# Patient Record
Sex: Female | Born: 1935 | Race: White | Hispanic: No | Smoking: Former smoker
Health system: Southern US, Community
[De-identification: ages and names within clinical notes are randomized; demographics above are authoritative.]

## PROBLEM LIST (undated history)

## (undated) DIAGNOSIS — D126 Benign neoplasm of colon, unspecified: Secondary | ICD-10-CM

## (undated) DIAGNOSIS — C679 Malignant neoplasm of bladder, unspecified: Secondary | ICD-10-CM

## (undated) DIAGNOSIS — I639 Cerebral infarction, unspecified: Secondary | ICD-10-CM

## (undated) DIAGNOSIS — C9000 Multiple myeloma not having achieved remission: Secondary | ICD-10-CM

## (undated) DIAGNOSIS — C801 Malignant (primary) neoplasm, unspecified: Secondary | ICD-10-CM

## (undated) DIAGNOSIS — K219 Gastro-esophageal reflux disease without esophagitis: Secondary | ICD-10-CM

## (undated) DIAGNOSIS — N189 Chronic kidney disease, unspecified: Secondary | ICD-10-CM

## (undated) DIAGNOSIS — R42 Dizziness and giddiness: Secondary | ICD-10-CM

## (undated) DIAGNOSIS — M1990 Unspecified osteoarthritis, unspecified site: Secondary | ICD-10-CM

## (undated) DIAGNOSIS — I10 Essential (primary) hypertension: Secondary | ICD-10-CM

## (undated) HISTORY — PX: OTHER SURGICAL HISTORY: SHX169

## (undated) HISTORY — PX: BREAST SURGERY: SHX581

## (undated) HISTORY — PX: HERNIA REPAIR: SHX51

---

## 1968-04-05 HISTORY — PX: BREAST EXCISIONAL BIOPSY: SUR124

## 2014-11-12 ENCOUNTER — Ambulatory Visit: Payer: Self-pay

## 2014-11-29 ENCOUNTER — Other Ambulatory Visit: Payer: Self-pay | Admitting: Internal Medicine

## 2014-11-29 ENCOUNTER — Ambulatory Visit
Admission: RE | Admit: 2014-11-29 | Discharge: 2014-11-29 | Disposition: A | Discharge status: Home / Self Care | Payer: Medicare Other | Source: Ambulatory Visit | Attending: Internal Medicine | Admitting: Internal Medicine

## 2014-11-29 DIAGNOSIS — M170 Bilateral primary osteoarthritis of knee: Secondary | ICD-10-CM | POA: Diagnosis not present

## 2014-11-29 DIAGNOSIS — C9000 Multiple myeloma not having achieved remission: Secondary | ICD-10-CM

## 2014-12-19 DIAGNOSIS — M11269 Other chondrocalcinosis, unspecified knee: Secondary | ICD-10-CM | POA: Insufficient documentation

## 2014-12-19 DIAGNOSIS — M1710 Unilateral primary osteoarthritis, unspecified knee: Secondary | ICD-10-CM | POA: Insufficient documentation

## 2014-12-19 DIAGNOSIS — M179 Osteoarthritis of knee, unspecified: Secondary | ICD-10-CM | POA: Insufficient documentation

## 2015-06-02 ENCOUNTER — Emergency Department: Payer: Medicare Other

## 2015-06-02 ENCOUNTER — Emergency Department
Admission: EM | Admit: 2015-06-02 | Discharge: 2015-06-02 | Disposition: A | Discharge status: Home / Self Care | Payer: Medicare Other | Attending: Emergency Medicine | Admitting: Emergency Medicine

## 2015-06-02 DIAGNOSIS — F419 Anxiety disorder, unspecified: Secondary | ICD-10-CM | POA: Insufficient documentation

## 2015-06-02 DIAGNOSIS — R110 Nausea: Secondary | ICD-10-CM | POA: Diagnosis not present

## 2015-06-02 DIAGNOSIS — I10 Essential (primary) hypertension: Secondary | ICD-10-CM | POA: Insufficient documentation

## 2015-06-02 DIAGNOSIS — R42 Dizziness and giddiness: Secondary | ICD-10-CM | POA: Diagnosis not present

## 2015-06-02 HISTORY — DX: Essential (primary) hypertension: I10

## 2015-06-02 HISTORY — DX: Malignant (primary) neoplasm, unspecified: C80.1

## 2015-06-02 LAB — CBC
HCT: 34.5 % — ABNORMAL LOW (ref 35.0–47.0)
Hemoglobin: 11.7 g/dL — ABNORMAL LOW (ref 12.0–16.0)
MCH: 31.5 pg (ref 26.0–34.0)
MCHC: 34 g/dL (ref 32.0–36.0)
MCV: 92.7 fL (ref 80.0–100.0)
PLATELETS: 250 10*3/uL (ref 150–440)
RBC: 3.72 MIL/uL — ABNORMAL LOW (ref 3.80–5.20)
RDW: 14 % (ref 11.5–14.5)
WBC: 7 10*3/uL (ref 3.6–11.0)

## 2015-06-02 LAB — BASIC METABOLIC PANEL
Anion gap: 9 (ref 5–15)
BUN: 28 mg/dL — AB (ref 6–20)
CALCIUM: 9 mg/dL (ref 8.9–10.3)
CO2: 23 mmol/L (ref 22–32)
CREATININE: 1.22 mg/dL — AB (ref 0.44–1.00)
Chloride: 101 mmol/L (ref 101–111)
GFR calc Af Amer: 48 mL/min — ABNORMAL LOW (ref >60)
GFR, EST NON AFRICAN AMERICAN: 41 mL/min — AB (ref >60)
Glucose, Bld: 121 mg/dL — ABNORMAL HIGH (ref 65–99)
Potassium: 4.2 mmol/L (ref 3.5–5.1)
SODIUM: 133 mmol/L — AB (ref 135–145)

## 2015-06-02 MED ORDER — ONDANSETRON 4 MG PO TBDP: 4.0000 mg | ORAL_TABLET | Freq: Three times a day (TID) | ORAL | Status: DC | PRN

## 2015-06-02 MED ORDER — ONDANSETRON HCL 4 MG/2ML IJ SOLN
4.0000 mg | Freq: Once | INTRAMUSCULAR | Status: DC
  Filled 2015-06-02: qty 2

## 2015-06-02 MED ORDER — MECLIZINE HCL 25 MG PO TABS: 25.0000 mg | ORAL_TABLET | Freq: Three times a day (TID) | ORAL | Status: DC | PRN

## 2015-06-02 MED ORDER — MECLIZINE HCL 25 MG PO TABS
25.0000 mg | ORAL_TABLET | Freq: Once | ORAL | Status: AC
  Administered 2015-06-02: 25 mg via ORAL
  Filled 2015-06-02: qty 1

## 2015-06-02 MED ORDER — LORAZEPAM 2 MG/ML IJ SOLN
0.5000 mg | Freq: Once | INTRAMUSCULAR | Status: AC
  Administered 2015-06-02: 0.5 mg via INTRAVENOUS
  Filled 2015-06-02: qty 1

## 2015-06-02 MED ORDER — SODIUM CHLORIDE 0.9 % IV BOLUS (SEPSIS)
1000.0000 mL | Freq: Once | INTRAVENOUS | Status: AC
  Administered 2015-06-02: 1000 mL via INTRAVENOUS

## 2015-06-02 NOTE — ED Notes (Signed)
Pt to CT

## 2015-06-02 NOTE — ED Notes (Signed)
Pt presents via EMS c/o nausea and dizziness. Per EMS pt became very dizzy and nauseated all of a sudden. EMS gave 4mg  of zofran without relief.

## 2015-06-02 NOTE — ED Provider Notes (Signed)
[facility] Emergency Department Provider Note  ____________________________________________  Time seen: Approximately 6:03 PM  I have reviewed the triage vital signs and the nursing notes.   HISTORY  Chief Complaint Dizziness    HPI Julie Khan is a 81 y.o. female with a history of HTN presenting with acute onset of dizziness and nausea. Patient reports that she was in her usual state of health, until she got on the bus [place] from the mall and with the movement of the bus she developed acute room spinning sensationand nausea without vomiting. Any movement of her head makes it worse. She does not have any visual changes, speech changes, mental status changes, numbness tickling or weakness. She is able to walk without difficulty. She was treated for bronchitis last month but otherwise has not had any recent cough or cold symptoms. No fever or chills, headache, or trauma.   Past Medical History  Diagnosis Date  . Hypertension   . Cancer ([place])     There are no active problems to display for this patient.   Past Surgical History  Procedure Laterality Date  . Bladder tumor removed      Current Outpatient Rx  Name  Route  Sig  Dispense  Refill  . meclizine (ANTIVERT) 25 MG tablet   Oral   Take 1 tablet (25 mg total) by mouth 3 (three) times daily as needed for dizziness.   15 tablet   0   . ondansetron (ZOFRAN ODT) 4 MG disintegrating tablet   Oral   Take 1 tablet (4 mg total) by mouth every 8 (eight) hours as needed for nausea or vomiting.   20 tablet   0     Allergies Iodine  Family History  Problem Relation Age of Onset  . Cancer Mother   . Cancer Sister   . Cancer Brother     Social History Social History  Substance Use Topics  . Smoking status: Never Smoker   . Smokeless tobacco: None  . Alcohol Use: No    Review of Systems Constitutional: No fever/chills. Positive dizziness. Negative syncope. Eyes: No visual  changes. No blurred or double vision. ENT: No sore throat. No congestion or rhinorrhea. No ear pain. Cardiovascular: Denies chest pain, palpitations. Respiratory: Denies shortness of breath.  No cough. Gastrointestinal: No abdominal pain.  No nausea, no vomiting.  No diarrhea.  No constipation. Genitourinary: Negative for dysuria. Musculoskeletal: Negative for back pain. Skin: Negative for rash. Neurological: Negative for headache. Positive for room spinning sensation. Negative for numbness tickling or weakness. Negative for visual changes. Negative for speech changes. Negative for difficulty walking.  10-point ROS otherwise negative.  ____________________________________________   PHYSICAL EXAM:  VITAL SIGNS: ED Triage Vitals  Enc Vitals Group     BP 06/02/15 1733 165/92 mmHg     Pulse Rate 06/02/15 1733 88     Resp 06/02/15 1733 14     Temp 06/02/15 1733 97.8 F (36.6 C)     Temp Source 06/02/15 1733 Oral     SpO2 06/02/15 1729 97 %     Weight 06/02/15 1733 155 lb (70.308 kg)     Height 06/02/15 1733 5' (1.524 m)     Head Cir --      Peak Flow --      Pain Score --      Pain Loc --      Pain Edu? --      Excl. in [place]? --  Constitutional: Asian is alert and oriented and able to answer questions appropriately. She is uncomfortable appearing and somewhat diffusely tremulous, but in no acute distress.  Eyes: Conjunctivae are normal.  EOMI. no discharge. No scleral icterus. EARS: TMs are clear without bulge, erythema or fluid bilaterally. Canals are clear as well. Head: Atraumatic. Nose: No congestion/rhinnorhea. Mouth/Throat: Mucous membranes are moist.  Neck: No stridor.  Supple.  JVD. No meningismus. Cardiovascular: Normal rate, regular rhythm. No murmurs, rubs or gallops.  Respiratory: Normal respiratory effort.  No retractions. Lungs CTAB.  No wheezes, rales or ronchi. Gastrointestinal: Soft and nontender. No distention. No peritoneal signs. Musculoskeletal: No LE  edema.  Neurologic: Alert and oriented 3. Speech is clear. Naming and repetition are intact. Face and smile symmetric. EOMI and PERRLA. No nystagmus horizontally or vertically, but lateral gaze reproduces her symptoms bilaterally. Tongue is midline.  No pronator drift. 5 out of 5 grip, biceps, triceps, hip flexors, plantar flexion and dorsiflexion. Normal sensation to light touch in the bilateral upper and lower extremities, and face.  Skin:  Skin is warm, dry and intact. No rash noted. Psychiatric: Mood is normal and affect is anxious.Marland Kitchen Speech and behavior are normal.  Normal judgement.  ____________________________________________   LABS (all labs ordered are listed, but only abnormal results are displayed)  Labs Reviewed  CBC - Abnormal; Notable for the following:    RBC 3.72 (*)    Hemoglobin 11.7 (*)    HCT 34.5 (*)    All other components within normal limits  BASIC METABOLIC PANEL - Abnormal; Notable for the following:    Sodium 133 (*)    Glucose, Bld 121 (*)    BUN 28 (*)    Creatinine, Ser 1.22 (*)    GFR calc non Af Amer 41 (*)    GFR calc Af Amer 48 (*)    All other components within normal limits   ____________________________________________  EKG  ED ECG REPORT I, Eula Listen, the attending physician, personally viewed and interpreted this ECG.   Date: 06/02/2015  EKG Time: 1729  Rate: 90  Rhythm: normal sinus rhythm  Axis: Normal  Intervals:none  ST&T Change: No ST changes. Positive  incomplete right bundle-branch block.  ____________________________________________  RADIOLOGY  Ct Head Wo Contrast  06/02/2015  CLINICAL DATA:  Involuntary tremors, dizziness, nausea EXAM: CT HEAD WITHOUT CONTRAST TECHNIQUE: Contiguous axial images were obtained from the base of the skull through the vertex without intravenous contrast. COMPARISON:  None. FINDINGS: No evidence of parenchymal hemorrhage or extra-axial fluid collection. No mass lesion, mass effect, or  midline shift. No CT evidence of acute infarction. Mild subcortical white matter and periventricular small vessel ischemic changes. Global cortical atrophy.  No ventriculomegaly. The visualized paranasal sinuses are essentially clear. The mastoid air cells are unopacified. No evidence of calvarial fracture. IMPRESSION: No evidence of acute intracranial abnormality. Atrophy with mild small vessel ischemic changes. Electronically Signed   By: Julian Hy M.D.   On: 06/02/2015 18:18    ____________________________________________   PROCEDURES  Procedure(s) performed: None  Critical Care performed: No ____________________________________________   INITIAL IMPRESSION / ASSESSMENT AND PLAN / ED COURSE  Pertinent labs & imaging results that were available during my care of the patient were reviewed by me and considered in my medical decision making (see chart for details).  81 y.o. female with acute onset of dizziness and nausea without vomiting with the movement of a bus. It is possible that the patient has a benign vertigo. However, given  her age and symptoms, I'll get a CT scan to evaluate for CVA. If in the meantime her symptoms improve with treatment for peripheral vertigo, no further imaging is indicated. If not, I'll send her for CT angiogram.  ----------------------------------------- 8:14 PM on 06/02/2015 -----------------------------------------  The patient's symptoms have completely resolved after meclizine and Ativan. She has some mild anemia with a hematocrit of 34.5, and some mild renal insufficiency which is likely due to dehydration. Her CT scan does not show any signs of stroke. I ambulated the patient and she has no gait ataxia, nor does she have any symptoms of dizziness or lightheadedness with walking.   The patient denied any similar symptoms in the past until I reevaluated her, when she reported to me that she had a severe bout last month where she was evaluated with  what is likely an Epley maneuver.  At this time, a peripheral vertigo is much more likely than a central stroke. However, I have given the patient and her family return precautions. They understand follow-up instructions. Plan discharge. ____________________________________________  FINAL CLINICAL IMPRESSION(S) / ED DIAGNOSES  Final diagnoses:  Vertigo  Nausea without vomiting      NEW MEDICATIONS STARTED DURING THIS VISIT:  New Prescriptions   MECLIZINE (ANTIVERT) 25 MG TABLET    Take 1 tablet (25 mg total) by mouth 3 (three) times daily as needed for dizziness.   ONDANSETRON (ZOFRAN ODT) 4 MG DISINTEGRATING TABLET    Take 1 tablet (4 mg total) by mouth every 8 (eight) hours as needed for nausea or vomiting.     Eula Listen, MD 06/02/15 2017

## 2015-06-02 NOTE — Discharge Instructions (Signed)
Please return to the emergency department if you develop dizziness or lightheadedness, fainting, numbness tingling or weakness, headache, visual or speech changes, difficulty walking, or any other symptoms concerning to you.

## 2015-07-24 ENCOUNTER — Encounter: Payer: Self-pay | Admitting: *Deleted

## 2015-07-25 ENCOUNTER — Ambulatory Visit: Payer: Medicare Other | Admitting: Anesthesiology

## 2015-07-25 ENCOUNTER — Ambulatory Visit
Admission: RE | Admit: 2015-07-25 | Discharge: 2015-07-25 | Disposition: A | Discharge status: Home / Self Care | Payer: Medicare Other | Source: Ambulatory Visit | Attending: Gastroenterology | Admitting: Gastroenterology

## 2015-07-25 ENCOUNTER — Encounter
Admission: RE | Disposition: A | Discharge status: Home / Self Care | Payer: Self-pay | Source: Ambulatory Visit | Attending: Gastroenterology

## 2015-07-25 DIAGNOSIS — M1990 Unspecified osteoarthritis, unspecified site: Secondary | ICD-10-CM | POA: Diagnosis not present

## 2015-07-25 DIAGNOSIS — K640 First degree hemorrhoids: Secondary | ICD-10-CM | POA: Insufficient documentation

## 2015-07-25 DIAGNOSIS — N189 Chronic kidney disease, unspecified: Secondary | ICD-10-CM | POA: Diagnosis not present

## 2015-07-25 DIAGNOSIS — Z91041 Radiographic dye allergy status: Secondary | ICD-10-CM | POA: Insufficient documentation

## 2015-07-25 DIAGNOSIS — D123 Benign neoplasm of transverse colon: Secondary | ICD-10-CM | POA: Diagnosis not present

## 2015-07-25 DIAGNOSIS — K5730 Diverticulosis of large intestine without perforation or abscess without bleeding: Secondary | ICD-10-CM | POA: Diagnosis not present

## 2015-07-25 DIAGNOSIS — Z8551 Personal history of malignant neoplasm of bladder: Secondary | ICD-10-CM | POA: Diagnosis not present

## 2015-07-25 DIAGNOSIS — C9000 Multiple myeloma not having achieved remission: Secondary | ICD-10-CM | POA: Insufficient documentation

## 2015-07-25 DIAGNOSIS — Z1211 Encounter for screening for malignant neoplasm of colon: Secondary | ICD-10-CM | POA: Diagnosis present

## 2015-07-25 DIAGNOSIS — K219 Gastro-esophageal reflux disease without esophagitis: Secondary | ICD-10-CM | POA: Insufficient documentation

## 2015-07-25 DIAGNOSIS — Z87891 Personal history of nicotine dependence: Secondary | ICD-10-CM | POA: Diagnosis not present

## 2015-07-25 DIAGNOSIS — E039 Hypothyroidism, unspecified: Secondary | ICD-10-CM | POA: Diagnosis not present

## 2015-07-25 DIAGNOSIS — I129 Hypertensive chronic kidney disease with stage 1 through stage 4 chronic kidney disease, or unspecified chronic kidney disease: Secondary | ICD-10-CM | POA: Diagnosis not present

## 2015-07-25 DIAGNOSIS — R1013 Epigastric pain: Secondary | ICD-10-CM | POA: Diagnosis not present

## 2015-07-25 DIAGNOSIS — Z809 Family history of malignant neoplasm, unspecified: Secondary | ICD-10-CM | POA: Diagnosis not present

## 2015-07-25 DIAGNOSIS — K449 Diaphragmatic hernia without obstruction or gangrene: Secondary | ICD-10-CM | POA: Insufficient documentation

## 2015-07-25 DIAGNOSIS — Z79899 Other long term (current) drug therapy: Secondary | ICD-10-CM | POA: Insufficient documentation

## 2015-07-25 DIAGNOSIS — Z906 Acquired absence of other parts of urinary tract: Secondary | ICD-10-CM | POA: Diagnosis not present

## 2015-07-25 DIAGNOSIS — Z800 Family history of malignant neoplasm of digestive organs: Secondary | ICD-10-CM | POA: Insufficient documentation

## 2015-07-25 HISTORY — DX: Unspecified osteoarthritis, unspecified site: M19.90

## 2015-07-25 HISTORY — PX: COLONOSCOPY WITH PROPOFOL: SHX5780

## 2015-07-25 HISTORY — PX: ESOPHAGOGASTRODUODENOSCOPY (EGD) WITH PROPOFOL: SHX5813

## 2015-07-25 HISTORY — DX: Gastro-esophageal reflux disease without esophagitis: K21.9

## 2015-07-25 HISTORY — DX: Malignant neoplasm of bladder, unspecified: C67.9

## 2015-07-25 HISTORY — DX: Chronic kidney disease, unspecified: N18.9

## 2015-07-25 SURGERY — COLONOSCOPY WITH PROPOFOL
Anesthesia: General

## 2015-07-25 MED ORDER — FENTANYL CITRATE (PF) 100 MCG/2ML IJ SOLN
INTRAMUSCULAR | Status: DC | PRN
  Administered 2015-07-25: 50 ug via INTRAVENOUS

## 2015-07-25 MED ORDER — PROPOFOL 10 MG/ML IV BOLUS
INTRAVENOUS | Status: DC | PRN
  Administered 2015-07-25: 30 mg via INTRAVENOUS
  Administered 2015-07-25: 20 mg via INTRAVENOUS

## 2015-07-25 MED ORDER — PROPOFOL 500 MG/50ML IV EMUL
INTRAVENOUS | Status: DC | PRN
Start: 2015-07-25 — End: 2015-07-25
  Administered 2015-07-25: 75 ug/kg/min via INTRAVENOUS

## 2015-07-25 MED ORDER — SODIUM CHLORIDE 0.9 % IV SOLN
INTRAVENOUS | Status: DC
  Administered 2015-07-25: 10:00:00 via INTRAVENOUS
  Administered 2015-07-25: 1000 mL via INTRAVENOUS

## 2015-07-25 MED ORDER — MIDAZOLAM HCL 2 MG/2ML IJ SOLN
INTRAMUSCULAR | Status: DC | PRN
  Administered 2015-07-25: 1 mg via INTRAVENOUS

## 2015-07-25 NOTE — Anesthesia Procedure Notes (Signed)
Date/Time: 07/25/2015 10:16 AM Performed by: Nelda Marseille Pre-anesthesia Checklist: Patient identified, Emergency Drugs available, Suction available, Patient being monitored and Timeout performed Oxygen Delivery Method: Nasal cannula

## 2015-07-25 NOTE — Op Note (Signed)
[facility] Gastroenterology Patient Name: Nakea Krolick Procedure Date: 07/25/2015 9:42 AM MRN: [id number] Account #: [id number] Date of Birth: [birth year] Admit Type: Outpatient Age: 81 Room: [facility] ENDO ROOM 1 Gender: Female Note Status: Finalized Procedure:            Upper GI endoscopy Indications:          Epigastric abdominal pain Patient Profile:      This is a 81 year old female. Providers:            Gerrit Heck. Rayann Heman, MD Referring MD:         Venetia Maxon. Elijio Miles, MD (Referring MD) Medicines:            Propofol per Anesthesia Complications:        No immediate complications. Procedure:            Pre-Anesthesia Assessment:                       - Prior to the procedure, a History and Physical was                        performed, and patient medications, allergies and                        sensitivities were reviewed. The patient's tolerance of                        previous anesthesia was reviewed.                       After obtaining informed consent, the endoscope was                        passed under direct vision. Throughout the procedure,                        the patient's blood pressure, pulse, and oxygen                        saturations were monitored continuously. The Endoscope                        was introduced through the mouth, and advanced to the                        second part of duodenum. The upper GI endoscopy was                        accomplished without difficulty. The patient tolerated                        the procedure well. Findings:      A small hiatal hernia was present. Esophagus otherwise normal.      The stomach was normal.      The examined duodenum was normal. Impression:           - Small hiatal hernia.                       - Normal stomach.                       -  Normal examined duodenum.                       - No specimens collected. Recommendation:       - Continue present medications.           - The findings and recommendations were discussed with                        the patient.                       - The findings and recommendations were discussed with                        the patient's family. Procedure Code(s):    --- Professional ---                       [phone], Esophagogastroduodenoscopy, flexible, transoral;                        diagnostic, including collection of specimen(s) by                        brushing or washing, when performed (separate procedure) Diagnosis Code(s):    --- Professional ---                       K44.9, Diaphragmatic hernia without obstruction or                        gangrene                       R10.13, Epigastric pain CPT copyright 2016 American Medical Association. All rights reserved. The codes documented in this report are preliminary and upon coder review may  be revised to meet current compliance requirements. Mellody Life, MD 07/25/2015 9:59:54 AM This report has been signed electronically. Number of Addenda: 0 Note Initiated On: 07/25/2015 9:42 AM      [facility]

## 2015-07-25 NOTE — Op Note (Signed)
[facility] Gastroenterology Patient Name: Julie Khan Procedure Date: 07/25/2015 9:41 AM MRN: [id number] Account #: [id number] Date of Birth: January 04, 1935 Admit Type: Outpatient Age: 81 Room: [facility] ENDO ROOM 1 Gender: Female Note Status: Finalized Procedure:            Colonoscopy Indications:          Screening in patient at increased risk: Family history                        of 1st-degree relative with colorectal cancer, Last                        colonoscopy 10 years ago Patient Profile:      This is a 81 year old female. Providers:            Gerrit Heck. Rayann Heman, MD Referring MD:         Venetia Maxon. Elijio Miles, MD (Referring MD) Medicines:            Propofol per Anesthesia Complications:        No immediate complications. Procedure:            Pre-Anesthesia Assessment:                       - Prior to the procedure, a History and Physical was                        performed, and patient medications, allergies and                        sensitivities were reviewed. The patient's tolerance of                        previous anesthesia was reviewed.                       After obtaining informed consent, the colonoscope was                        passed under direct vision. Throughout the procedure,                        the patient's blood pressure, pulse, and oxygen                        saturations were monitored continuously. The                        Colonoscope was introduced through the anus and                        advanced to the the cecum, identified by appendiceal                        orifice and ileocecal valve. The colonoscopy was                        performed without difficulty. The patient tolerated the                        procedure well. The quality of the bowel preparation  was excellent. Findings:      The perianal exam findings include few small ulcers. ? prep artifact.      Two sessile polyps were found in  the transverse colon. The polyps were 3       to 4 mm in size. These polyps were removed with a jumbo cold forceps.       Resection and retrieval were complete.      Many small and large-mouthed diverticula were found in the sigmoid colon.      Internal hemorrhoids were found during retroflexion. The hemorrhoids       were Grade I (internal hemorrhoids that do not prolapse).      The exam was otherwise without abnormality. Impression:           - Few small peri-anal ulcers. ? prep artifact                       - Two 3 to 4 mm polyps in the transverse colon, removed                        with a jumbo cold forceps. Resected and retrieved.                       - Diverticulosis in the sigmoid colon.                       - Internal hemorrhoids.                       - The examination was otherwise normal. Recommendation:       - Observe patient in GI recovery unit.                       - High fiber diet.                       - Continue present medications.                       - Await pathology results.                       - Return to referring physician.                       - The findings and recommendations were discussed with                        the patient.                       - The findings and recommendations were discussed with                        the patient's family. Procedure Code(s):    --- Professional ---                       [phone], Colonoscopy, flexible; with biopsy, single or                        multiple Diagnosis Code(s):    --- Professional ---  Z80.0, Family history of malignant neoplasm of                        digestive organs                       K64.0, First degree hemorrhoids                       D12.3, Benign neoplasm of transverse colon (hepatic                        flexure or splenic flexure)                       K57.30, Diverticulosis of large intestine without                        perforation or abscess without  bleeding CPT copyright 2016 American Medical Association. All rights reserved. The codes documented in this report are preliminary and upon coder review may  be revised to meet current compliance requirements. Mellody Life, MD 07/25/2015 10:23:30 AM This report has been signed electronically. Number of Addenda: 0 Note Initiated On: 07/25/2015 9:41 AM Scope Withdrawal Time: 0 hours 7 minutes 2 seconds  Total Procedure Duration: 0 hours 14 minutes 9 seconds       [facility]

## 2015-07-25 NOTE — Anesthesia Preprocedure Evaluation (Signed)
Anesthesia Evaluation  Patient identified by MRN, date of birth, ID band Patient awake    Reviewed: Allergy & Precautions, NPO status , Patient's Chart, lab work & pertinent test results, reviewed documented beta blocker date and time   Airway Mallampati: II  TM Distance: >3 FB     Dental  (+) Partial Upper, Partial Lower, Poor Dentition, Chipped   Pulmonary former smoker,    Pulmonary exam normal breath sounds clear to auscultation       Cardiovascular hypertension, Pt. on medications and Pt. on home beta blockers Normal cardiovascular exam     Neuro/Psych negative neurological ROS     GI/Hepatic Neg liver ROS, GERD  Medicated and Controlled,  Endo/Other  Hypothyroidism   Renal/GU Renal InsufficiencyRenal disease Bladder dysfunction      Musculoskeletal  (+) Arthritis , Osteoarthritis,    Abdominal Normal abdominal exam  (+)   Peds  Hematology   Anesthesia Other Findings Bladder CA  Reproductive/Obstetrics                             Anesthesia Physical Anesthesia Plan  ASA: III  Anesthesia Plan: General   Post-op Pain Management:    Induction: Intravenous  Airway Management Planned: Nasal Cannula  Additional Equipment:   Intra-op Plan:   Post-operative Plan:   Informed Consent: I have reviewed the patients History and Physical, chart, labs and discussed the procedure including the risks, benefits and alternatives for the proposed anesthesia with the patient or authorized representative who has indicated his/her understanding and acceptance.   Dental advisory given  Plan Discussed with: CRNA and Surgeon  Anesthesia Plan Comments:         Anesthesia Quick Evaluation

## 2015-07-25 NOTE — Transfer of Care (Signed)
Immediate Anesthesia Transfer of Care Note  Patient: Julie Khan  Procedure(s) Performed: Procedure(s): COLONOSCOPY WITH PROPOFOL (N/A) ESOPHAGOGASTRODUODENOSCOPY (EGD) WITH PROPOFOL (N/A)  Patient Location: PACU  Anesthesia Type:General  Level of Consciousness: sedated  Airway & Oxygen Therapy: Patient Spontanous Breathing and Patient connected to nasal cannula oxygen  Post-op Assessment: Report given to RN and Post -op Vital signs reviewed and stable  Post vital signs: Reviewed and stable  Last Vitals:  Filed Vitals:   07/25/15 0913 07/25/15 1023  BP: 159/93 100/57  Pulse: 78 91  Temp: 36.4 C   Resp: 16 15    Complications: No apparent anesthesia complications

## 2015-07-25 NOTE — Anesthesia Postprocedure Evaluation (Signed)
Anesthesia Post Note  Patient: Julie Khan  Procedure(s) Performed: Procedure(s) (LRB): COLONOSCOPY WITH PROPOFOL (N/A) ESOPHAGOGASTRODUODENOSCOPY (EGD) WITH PROPOFOL (N/A)  Patient location during evaluation: PACU Anesthesia Type: General Level of consciousness: awake and alert and oriented Pain management: pain level controlled Vital Signs Assessment: post-procedure vital signs reviewed and stable Respiratory status: spontaneous breathing Cardiovascular status: blood pressure returned to baseline Anesthetic complications: no    Last Vitals:  Filed Vitals:   07/25/15 1043 07/25/15 1053  BP: 118/85 123/76  Pulse: 76 73  Temp:    Resp: 12 12    Last Pain: There were no vitals filed for this visit.               [name]

## 2015-07-25 NOTE — Discharge Instructions (Signed)

## 2015-07-25 NOTE — H&P (Addendum)
Primary Care Physician:  Volanda Napoleon, MD  Pre-Procedure History & Physical: HPI:  Julie Khan is a 81 y.o. female is here for an endoscopy / colonoscopy   Past Medical History  Diagnosis Date  . Hypertension   . Arthritis   . Chronic kidney disease   . Cancer ([place])     multiple myeloma  . Bladder cancer ([place])   . GERD (gastroesophageal reflux disease)     Past Surgical History  Procedure Laterality Date  . Bladder tumor removed    . Hernia repair      Prior to Admission medications   Medication Sig Start Date End Date Taking? Authorizing Provider  atorvastatin (LIPITOR) 20 MG tablet Take 20 mg by mouth daily.   Yes Historical Provider, MD  cetirizine (ZYRTEC) 10 MG tablet Take 10 mg by mouth daily.   Yes Historical Provider, MD  Cholecalciferol (VITAMIN D3) 2000 units capsule Take 2,000 Units by mouth daily.   Yes Historical Provider, MD  clonazePAM (KLONOPIN) 1 MG tablet Take 1 mg by mouth at bedtime.   Yes Historical Provider, MD  clotrimazole (LOTRIMIN) 1 % cream Apply 1 application topically 2 (two) times daily.   Yes Historical Provider, MD  esomeprazole (NEXIUM) 40 MG capsule Take 40 mg by mouth daily at 12 noon.   Yes Historical Provider, MD  estradiol (ESTRACE) 0.1 MG/GM vaginal cream Place 1 Applicatorful vaginally at bedtime.   Yes Historical Provider, MD  gabapentin (NEURONTIN) 400 MG capsule Take 400 mg by mouth 3 (three) times daily.   Yes Historical Provider, MD  ibuprofen (ADVIL) 200 MG tablet Take 400 mg by mouth every 8 (eight) hours as needed.   Yes Historical Provider, MD  levothyroxine (SYNTHROID, LEVOTHROID) 50 MCG tablet Take 50 mcg by mouth daily before breakfast.   Yes Historical Provider, MD  losartan (COZAAR) 25 MG tablet Take 25 mg by mouth daily.   Yes Historical Provider, MD  magnesium oxide (MAG-OX) 400 MG tablet Take 400 mg by mouth daily.   Yes Historical Provider, MD  metoprolol succinate (TOPROL-XL) 25 MG 24 hr tablet Take 25 mg  by mouth daily.   Yes Historical Provider, MD  pantoprazole (PROTONIX) 40 MG tablet Take 40 mg by mouth daily.   Yes Historical Provider, MD  triamcinolone (KENALOG) 0.1 % paste Use as directed 1 application in the mouth or throat 2 (two) times daily.   Yes Historical Provider, MD  meclizine (ANTIVERT) 25 MG tablet Take 1 tablet (25 mg total) by mouth 3 (three) times daily as needed for dizziness. 06/02/15   Anne-Caroline Mariea Clonts, MD  ondansetron (ZOFRAN ODT) 4 MG disintegrating tablet Take 1 tablet (4 mg total) by mouth every 8 (eight) hours as needed for nausea or vomiting. 06/02/15   Eula Listen, MD    Allergies as of 07/18/2015 - Review Complete 06/02/2015  Allergen Reaction Noted  . Iodine  06/02/2015    Family History  Problem Relation Age of Onset  . Cancer Mother   . Cancer Sister   . Cancer Brother     Social History   Social History  . Marital Status: Divorced    Spouse Name: N/A  . Number of Children: N/A  . Years of Education: N/A   Occupational History  . Not on file.   Social History Main Topics  . Smoking status: Former Research scientist (life sciences)  . Smokeless tobacco: Never Used  . Alcohol Use: No  . Drug Use: No  . Sexual Activity: Not on file  Other Topics Concern  . Not on file   Social History Narrative     Physical Exam: BP 159/93 mmHg  Pulse 78  Temp(Src) 97.6 F (36.4 C) (Tympanic)  Resp 16  Ht 5' (1.524 m)  Wt 70.308 kg (155 lb)  BMI 30.27 kg/m2  SpO2 98% General:   Alert,  pleasant and cooperative in NAD Head:  Normocephalic and atraumatic. Neck:  Supple; no masses or thyromegaly. Lungs:  Clear throughout to auscultation.    Heart:  Regular rate and rhythm. Abdomen:  Soft, nontender and nondistended. Normal bowel sounds, without guarding, and without rebound.   Neurologic:  Alert and  oriented x4;  grossly normal neurologically.  Impression/Plan: Julie Khan is here for an endoscopy to be performed epi pain,  Colonoscopy for fam hx colon  ca  Risks, benefits, limitations, and alternatives regarding  Endoscopy/colonoscopy have been reviewed with the patient.  Questions have been answered.  All parties agreeable.   Josefine Class, MD  07/25/2015, 9:44 AM

## 2015-07-28 LAB — SURGICAL PATHOLOGY

## 2015-07-30 ENCOUNTER — Encounter: Payer: Self-pay | Admitting: Gastroenterology

## 2015-09-15 ENCOUNTER — Ambulatory Visit: Payer: Self-pay

## 2015-11-26 DIAGNOSIS — M13869 Other specified arthritis, unspecified knee: Secondary | ICD-10-CM | POA: Insufficient documentation

## 2015-11-26 DIAGNOSIS — K649 Unspecified hemorrhoids: Secondary | ICD-10-CM | POA: Insufficient documentation

## 2016-02-16 ENCOUNTER — Other Ambulatory Visit: Payer: Self-pay | Admitting: Internal Medicine

## 2016-02-16 DIAGNOSIS — D472 Monoclonal gammopathy: Secondary | ICD-10-CM

## 2016-02-20 ENCOUNTER — Ambulatory Visit
Admission: RE | Admit: 2016-02-20 | Discharge: 2016-02-20 | Disposition: A | Discharge status: Home / Self Care | Payer: Medicare Other | Source: Ambulatory Visit | Attending: Internal Medicine | Admitting: Internal Medicine

## 2016-02-20 DIAGNOSIS — D472 Monoclonal gammopathy: Secondary | ICD-10-CM | POA: Insufficient documentation

## 2016-06-11 DIAGNOSIS — C9000 Multiple myeloma not having achieved remission: Secondary | ICD-10-CM | POA: Insufficient documentation

## 2016-06-29 ENCOUNTER — Ambulatory Visit (INDEPENDENT_AMBULATORY_CARE_PROVIDER_SITE_OTHER): Payer: Medicare Other | Admitting: Urology

## 2016-06-29 ENCOUNTER — Encounter: Payer: Self-pay | Admitting: Urology

## 2016-06-29 VITALS — BP 136/77 | HR 69 | Ht 60.0 in | Wt 156.7 lb

## 2016-06-29 DIAGNOSIS — D472 Monoclonal gammopathy: Secondary | ICD-10-CM | POA: Insufficient documentation

## 2016-06-29 DIAGNOSIS — Z8551 Personal history of malignant neoplasm of bladder: Secondary | ICD-10-CM | POA: Diagnosis not present

## 2016-06-29 DIAGNOSIS — C9000 Multiple myeloma not having achieved remission: Secondary | ICD-10-CM | POA: Insufficient documentation

## 2016-06-29 DIAGNOSIS — C679 Malignant neoplasm of bladder, unspecified: Secondary | ICD-10-CM | POA: Insufficient documentation

## 2016-06-29 NOTE — Progress Notes (Signed)
06/29/2016 3:48 PM   Julie Khan 1935/02/14 [id number]  Referring provider: Jodi Marble, MD [address] [place],  [address]  Chief Complaint  Patient presents with  . Bladder Cancer    New Patient    HPI: 82 yo F referred to establish care for known personal history of bladder cancer.  She had history of bladder cancer dx 19 years ago followed by 6 weeks BCG.  She had routine surveillance cystoscopy last in 2016 since that time. She is due for cysto.    She had history of bladder cancer dx 19 years ago followed by 6 weeks BCG.  She had routine surveillance cystoscopy annually since that time without recurrence.   Per personal report, she was told her cancer was stage 2 bladder cancer, dx and treated in [place].  It is unclear whether this was squamous or TCC.   She is from [place] and moved here 2 years ago from [place] (live there for 25 years).   She does have a history of "bladder lift" procedure 30 years ago.    UA today without microscopic hematuria or evidence of infection.    She denies any urinary symptoms today including urinary frequency, urgency, incontinence, gross hematuria or   PMH: Past Medical History:  Diagnosis Date  . Arthritis   . Bladder cancer ([place])   . Cancer ([place])    multiple myeloma  . Chronic kidney disease   . GERD (gastroesophageal reflux disease)   . Hypertension     Surgical History: Past Surgical History:  Procedure Laterality Date  . bladder tumor removed    . COLONOSCOPY WITH PROPOFOL N/A 07/25/2015   Procedure: COLONOSCOPY WITH PROPOFOL;  Surgeon: Josefine Class, MD;  Location: [facility] ENDOSCOPY;  Service: Endoscopy;  Laterality: N/A;  . ESOPHAGOGASTRODUODENOSCOPY (EGD) WITH PROPOFOL N/A 07/25/2015   Procedure: ESOPHAGOGASTRODUODENOSCOPY (EGD) WITH PROPOFOL;  Surgeon: Josefine Class, MD;  Location: [facility] ENDOSCOPY;  Service: Endoscopy;  Laterality: N/A;  . HERNIA REPAIR      Home Medications:  Allergies as  of 06/29/2016      Reactions   Iodine    seizure      Medication List       Accurate as of 06/29/16  3:48 PM. Always use your most recent med list.          atorvastatin 20 MG tablet Commonly known as:  LIPITOR Take 20 mg by mouth daily.   levothyroxine 50 MCG tablet Commonly known as:  SYNTHROID, LEVOTHROID Take 50 mcg by mouth daily before breakfast.   losartan 25 MG tablet Commonly known as:  COZAAR Take 25 mg by mouth daily.   metoprolol succinate 25 MG 24 hr tablet Commonly known as:  TOPROL-XL Take 25 mg by mouth daily.   oxazepam 10 MG capsule Commonly known as:  SERAX Take 10 mg by mouth at bedtime as needed for sleep or anxiety.   pantoprazole 40 MG tablet Commonly known as:  PROTONIX Take 40 mg by mouth daily.       Allergies:  Allergies  Allergen Reactions  . Iodine     seizure    Family History: Family History  Problem Relation Age of Onset  . Cancer Mother   . Cancer Sister   . Cancer Brother   . Bladder Cancer Neg Hx   . Kidney cancer Neg Hx     Social History:  reports that she has quit smoking. She has never used smokeless tobacco. She reports that she does  not drink alcohol or use drugs.  ROS: UROLOGY Frequent Urination?: No Hard to postpone urination?: No Burning/pain with urination?: No Get up at night to urinate?: Yes Leakage of urine?: No Urine stream starts and stops?: No Trouble starting stream?: No Do you have to strain to urinate?: No Blood in urine?: No Urinary tract infection?: No Sexually transmitted disease?: No Injury to kidneys or bladder?: Yes Painful intercourse?: No Weak stream?: No Currently pregnant?: No Vaginal bleeding?: No Last menstrual period?: 1992  Gastrointestinal Nausea?: No Vomiting?: No Indigestion/heartburn?: Yes Diarrhea?: No Constipation?: No  Constitutional Fever: No Night sweats?: No Weight loss?: No Fatigue?: Yes  Skin Skin rash/lesions?: No Itching?: No  Eyes Blurred  vision?: No Double vision?: No  Ears/Nose/Throat Sore throat?: Yes Sinus problems?: No  Hematologic/Lymphatic Swollen glands?: No Easy bruising?: No  Cardiovascular Leg swelling?: No Chest pain?: No  Respiratory Cough?: No Shortness of breath?: No  Endocrine Excessive thirst?: No  Musculoskeletal Back pain?: Yes Joint pain?: Yes  Neurological Headaches?: No Dizziness?: No  Psychologic Depression?: No Anxiety?: No  Physical Exam: BP 136/77 (BP Location: Left Arm, Patient Position: Sitting, Cuff Size: Normal)   Pulse 69   Ht 5' (1.524 m)   Wt 156 lb 11.2 oz (71.1 kg)   BMI 30.60 kg/m   Constitutional:  Alert and oriented, No acute distress. HEENT: [place] AT, moist mucus membranes.  Trachea midline, no masses. Cardiovascular: No clubbing, cyanosis, or edema. Respiratory: Normal respiratory effort, no increased work of breathing. GI: Abdomen is soft, nontender, nondistended, no abdominal masses GU: No CVA tenderness.  Skin: No rashes, bruises or suspicious lesions. Neurologic: Grossly intact, no focal deficits, moving all 4 extremities. Psychiatric: Normal mood and affect.  Laboratory Data: Lab Results  Component Value Date   WBC 7.0 06/02/2015   HGB 11.7 (L) 06/02/2015   HCT 34.5 (L) 06/02/2015   MCV 92.7 06/02/2015   PLT 250 06/02/2015    Lab Results  Component Value Date   CREATININE 1.22 (H) 06/02/2015   Urinalysis Results for orders placed or performed in visit on 06/29/16  Microscopic Examination  Result Value Ref Range   WBC, UA 0-5 0 - 5 /hpf   RBC, UA None seen 0 - 2 /hpf   Epithelial Cells (non renal) >10 (H) 0 - 10 /hpf   Mucus, UA Present (A) Not Estab.   Bacteria, UA Many (A) None seen/Few  Urinalysis, Complete  Result Value Ref Range   Specific Gravity, UA 1.015 1.005 - 1.030   pH, UA 6.0 5.0 - 7.5   Color, UA Yellow Yellow   Appearance Ur Clear Clear   Leukocytes, UA 1+ (A) Negative   Protein, UA Negative Negative/Trace    Glucose, UA Negative Negative   Ketones, UA Negative Negative   RBC, UA Negative Negative   Bilirubin, UA Negative Negative   Urobilinogen, Ur 0.2 0.2 - 1.0 mg/dL   Nitrite, UA Negative Negative   Microscopic Examination See below:      Pertinent Imaging: n/a  Assessment & Plan:    1. History of bladder cancer Remote history of bladder cancer, original pathology unknown- unlikely able to obtain pathology given procedure was done in [place] No recurrence since dx/ treatment We discussed today options including cessation of annual cystoscopy versus continued annual screening, risk and benefits of both- she would like to continue annual screening which is reasonable given her excellent health She was offered cystoscopy today but like to reschedule for in a few months which is reasonalbe  All questions answered - Urinalysis, Complete   Return in about 2 months (around 08/29/2016) for cystoscopy.  Hollice Espy, MD  [facility] [address], [place], [place] [address] [phone]

## 2016-07-01 LAB — MICROSCOPIC EXAMINATION: RBC MICROSCOPIC, UA: NONE SEEN /HPF (ref 0–?)

## 2016-07-01 LAB — URINALYSIS, COMPLETE
Bilirubin, UA: NEGATIVE
GLUCOSE, UA: NEGATIVE
KETONES UA: NEGATIVE
NITRITE UA: NEGATIVE
Protein, UA: NEGATIVE
RBC, UA: NEGATIVE
Specific Gravity, UA: 1.015 (ref 1.005–1.030)
UUROB: 0.2 mg/dL (ref 0.2–1.0)
pH, UA: 6 (ref 5.0–7.5)

## 2016-07-02 ENCOUNTER — Inpatient Hospital Stay: Payer: Medicare Other | Admitting: Oncology

## 2016-07-05 NOTE — Progress Notes (Signed)
[place]  [phone] [phone]  ID: Julie Khan OB: November 05, 1934  MR#: [id number]  KWI#:[id number]  Patient Care Team: Jodi Marble, MD as PCP - General (Internal Medicine)  CHIEF COMPLAINT: Smoldering myeloma  INTERVAL HISTORY: Patient is a 82 year old female with documented smoldering myeloma who is referred for continued observation and management. She currently feels well and is asymptomatic. She has no neurologic complaints. She denies any recent fevers or illnesses. She has a good appetite and denies weight loss. She has no chest pain or shortness of breath. She denies any nausea, vomiting, constipation, or diarrhea. She has no urinary complaints. Patient feels at her baseline and offers no specific complaints today.  REVIEW OF SYSTEMS:   Review of Systems  Constitutional: Negative.  Negative for fever, malaise/fatigue and weight loss.  Respiratory: Negative.  Negative for cough and shortness of breath.   Cardiovascular: Negative.  Negative for chest pain and leg swelling.  Gastrointestinal: Negative.  Negative for abdominal pain, blood in stool and melena.  Genitourinary: Negative.   Musculoskeletal: Negative.   Neurological: Negative.  Negative for sensory change and weakness.  Psychiatric/Behavioral: Negative.  The patient is not nervous/anxious.     As per HPI. Otherwise, a complete review of systems is negative.  PAST MEDICAL HISTORY: Past Medical History:  Diagnosis Date  . Arthritis   . Bladder cancer ([place])   . Cancer ([place])    multiple myeloma  . Chronic kidney disease   . GERD (gastroesophageal reflux disease)   . Hypertension     PAST SURGICAL HISTORY: Past Surgical History:  Procedure Laterality Date  . bladder tumor removed    . COLONOSCOPY WITH PROPOFOL N/A 07/25/2015   Procedure: COLONOSCOPY WITH PROPOFOL;  Surgeon: Josefine Class, MD;  Location: [facility] ENDOSCOPY;  Service: Endoscopy;  Laterality: N/A;   . ESOPHAGOGASTRODUODENOSCOPY (EGD) WITH PROPOFOL N/A 07/25/2015   Procedure: ESOPHAGOGASTRODUODENOSCOPY (EGD) WITH PROPOFOL;  Surgeon: Josefine Class, MD;  Location: [facility] ENDOSCOPY;  Service: Endoscopy;  Laterality: N/A;  . HERNIA REPAIR      FAMILY HISTORY: Family History  Problem Relation Age of Onset  . Cancer Mother   . Cancer Sister   . Cancer Brother   . Bladder Cancer Neg Hx   . Kidney cancer Neg Hx     ADVANCED DIRECTIVES (Y/N):  N  HEALTH MAINTENANCE: Social History  Substance Use Topics  . Smoking status: Former Research scientist (life sciences)  . Smokeless tobacco: Never Used  . Alcohol use No     Colonoscopy:  PAP:  Bone density:  Lipid panel:  Allergies  Allergen Reactions  . Iodine     seizure    Current Outpatient Prescriptions  Medication Sig Dispense Refill  . clotrimazole (LOTRIMIN) 1 % cream Apply 1 application topically 2 (two) times daily.    Marland Kitchen losartan (COZAAR) 25 MG tablet Take 25 mg by mouth daily.    . metoprolol succinate (TOPROL-XL) 25 MG 24 hr tablet Take 25 mg by mouth daily.    . pantoprazole (PROTONIX) 40 MG tablet Take 40 mg by mouth daily.     No current facility-administered medications for this visit.     OBJECTIVE: Vitals:   07/06/16 1155  BP: (!) 166/97  Pulse: 67  Resp: 18  Temp: (!) 96.5 F (35.8 C)     Body mass index is 31.15 kg/m.    ECOG FS:0 - Asymptomatic  General: Well-developed, well-nourished, no acute distress. Eyes: Pink conjunctiva, anicteric sclera. HEENT: Normocephalic, moist mucous membranes, clear  oropharnyx. Lungs: Clear to auscultation bilaterally. Heart: Regular rate and rhythm. No rubs, murmurs, or gallops. Abdomen: Soft, nontender, nondistended. No organomegaly noted, normoactive bowel sounds. Musculoskeletal: No edema, cyanosis, or clubbing. Neuro: Alert, answering all questions appropriately. Cranial nerves grossly intact. Skin: No rashes or petechiae noted. Psych: Normal affect. Lymphatics: No cervical,  calvicular, axillary or inguinal LAD.   LAB RESULTS:  Lab Results  Component Value Date   NA 133 (L) 07/06/2016   K 4.6 07/06/2016   CL 104 07/06/2016   CO2 25 07/06/2016   GLUCOSE 99 07/06/2016   BUN 30 (H) 07/06/2016   CREATININE 1.46 (H) 07/06/2016   CALCIUM 9.4 07/06/2016   PROT 9.2 (H) 07/06/2016   ALBUMIN 4.0 07/06/2016   AST 41 07/06/2016   ALT 27 07/06/2016   ALKPHOS 110 07/06/2016   BILITOT 0.7 07/06/2016   GFRNONAA 33 (L) 07/06/2016   GFRAA 38 (L) 07/06/2016    Lab Results  Component Value Date   WBC 4.8 07/06/2016   NEUTROABS 2.3 07/06/2016   HGB 12.5 07/06/2016   HCT 36.2 07/06/2016   MCV 89.7 07/06/2016   PLT 188 07/06/2016   Lab Results  Component Value Date   TOTALPROTELP 8.7 (H) 07/06/2016   ALBUMINELP 3.7 07/06/2016   A1GS 0.2 07/06/2016   A2GS 0.8 07/06/2016   BETS 0.9 07/06/2016   GAMS 3.0 (H) 07/06/2016   MSPIKE 2.5 (H) 07/06/2016   SPEI Comment 07/06/2016     STUDIES: No results found.  ASSESSMENT: Smoldering myeloma  PLAN:   1. Smoldering myeloma: Bone marrow biopsy on December 23, 2011 revealed 15% plasma cells with cytogenetics having monosomy 13 and approximately 7%. Skeletal survey on February 20, 2016 was negative for any lesions. Patient's M spike continues to be stable ranging from approximately 2.0-2.5. Her IgG levels are significantly elevated at 3118, but is unclear what they were prior. Chains are also elevated at 151.1. She has mild renal insufficiency, but otherwise no evidence of endorgan damage. No intervention is needed at this time. Patient does not require repeat bone marrow biopsy. Return to clinic in 3 months with repeat laboratory work and further evaluation. If everything remains stable at that time, patient likely can be monitored every 4-6 months. 2. Renal insufficiency: Unclear patient's baseline, monitor. 3. Hypertension: Patient's blood pressure is elevated today, monitor. Continue treatment per primary  care.  Approximate 45 minutes was spent in discussion of which greater than 50% was consultation.  Patient expressed understanding and was in agreement with this plan. She also understands that She can call clinic at any time with any questions, concerns, or complaints.    Lloyd Huger, MD   07/11/2016 7:47 AM

## 2016-07-06 ENCOUNTER — Inpatient Hospital Stay: Payer: Medicare Other | Attending: Oncology | Admitting: Oncology

## 2016-07-06 ENCOUNTER — Encounter: Payer: Self-pay | Admitting: Oncology

## 2016-07-06 ENCOUNTER — Inpatient Hospital Stay: Payer: Medicare Other

## 2016-07-06 VITALS — BP 166/97 | HR 67 | Temp 96.5°F | Resp 18 | Ht 60.0 in | Wt 159.5 lb

## 2016-07-06 DIAGNOSIS — Z8551 Personal history of malignant neoplasm of bladder: Secondary | ICD-10-CM | POA: Insufficient documentation

## 2016-07-06 DIAGNOSIS — I129 Hypertensive chronic kidney disease with stage 1 through stage 4 chronic kidney disease, or unspecified chronic kidney disease: Secondary | ICD-10-CM | POA: Diagnosis not present

## 2016-07-06 DIAGNOSIS — C9000 Multiple myeloma not having achieved remission: Secondary | ICD-10-CM | POA: Diagnosis present

## 2016-07-06 DIAGNOSIS — N189 Chronic kidney disease, unspecified: Secondary | ICD-10-CM | POA: Diagnosis not present

## 2016-07-06 DIAGNOSIS — Z87891 Personal history of nicotine dependence: Secondary | ICD-10-CM | POA: Diagnosis not present

## 2016-07-06 DIAGNOSIS — Z79899 Other long term (current) drug therapy: Secondary | ICD-10-CM

## 2016-07-06 DIAGNOSIS — K219 Gastro-esophageal reflux disease without esophagitis: Secondary | ICD-10-CM | POA: Diagnosis not present

## 2016-07-06 DIAGNOSIS — M129 Arthropathy, unspecified: Secondary | ICD-10-CM

## 2016-07-06 LAB — COMPREHENSIVE METABOLIC PANEL
ALT: 27 U/L (ref 14–54)
AST: 41 U/L (ref 15–41)
Albumin: 4 g/dL (ref 3.5–5.0)
Alkaline Phosphatase: 110 U/L (ref 38–126)
Anion gap: 4 — ABNORMAL LOW (ref 5–15)
BILIRUBIN TOTAL: 0.7 mg/dL (ref 0.3–1.2)
BUN: 30 mg/dL — AB (ref 6–20)
CO2: 25 mmol/L (ref 22–32)
CREATININE: 1.46 mg/dL — AB (ref 0.44–1.00)
Calcium: 9.4 mg/dL (ref 8.9–10.3)
Chloride: 104 mmol/L (ref 101–111)
GFR, EST AFRICAN AMERICAN: 38 mL/min — AB (ref >60)
GFR, EST NON AFRICAN AMERICAN: 33 mL/min — AB (ref >60)
Glucose, Bld: 99 mg/dL (ref 65–99)
Potassium: 4.6 mmol/L (ref 3.5–5.1)
Sodium: 133 mmol/L — ABNORMAL LOW (ref 135–145)
TOTAL PROTEIN: 9.2 g/dL — AB (ref 6.5–8.1)

## 2016-07-06 LAB — CBC WITH DIFFERENTIAL/PLATELET
BASOS ABS: 0 10*3/uL (ref 0–0.1)
Basophils Relative: 1 %
Eosinophils Absolute: 0 10*3/uL (ref 0–0.7)
Eosinophils Relative: 0 %
HEMATOCRIT: 36.2 % (ref 35.0–47.0)
Hemoglobin: 12.5 g/dL (ref 12.0–16.0)
LYMPHS ABS: 2.1 10*3/uL (ref 1.0–3.6)
LYMPHS PCT: 43 %
MCH: 31 pg (ref 26.0–34.0)
MCHC: 34.6 g/dL (ref 32.0–36.0)
MCV: 89.7 fL (ref 80.0–100.0)
MONO ABS: 0.4 10*3/uL (ref 0.2–0.9)
Monocytes Relative: 8 %
NEUTROS ABS: 2.3 10*3/uL (ref 1.4–6.5)
Neutrophils Relative %: 48 %
Platelets: 188 10*3/uL (ref 150–440)
RBC: 4.04 MIL/uL (ref 3.80–5.20)
RDW: 13.2 % (ref 11.5–14.5)
WBC: 4.8 10*3/uL (ref 3.6–11.0)

## 2016-07-06 NOTE — Progress Notes (Signed)
New evaluation for multiple myeloma. States is feeling well. Offers no complaints. 

## 2016-07-07 LAB — PROTEIN ELECTROPHORESIS, SERUM
A/G Ratio: 0.7 (ref 0.7–1.7)
ALBUMIN ELP: 3.7 g/dL (ref 2.9–4.4)
Alpha-1-Globulin: 0.2 g/dL (ref 0.0–0.4)
Alpha-2-Globulin: 0.8 g/dL (ref 0.4–1.0)
Beta Globulin: 0.9 g/dL (ref 0.7–1.3)
GAMMA GLOBULIN: 3 g/dL — AB (ref 0.4–1.8)
Globulin, Total: 5 g/dL — ABNORMAL HIGH (ref 2.2–3.9)
M-Spike, %: 2.5 g/dL — ABNORMAL HIGH
TOTAL PROTEIN ELP: 8.7 g/dL — AB (ref 6.0–8.5)

## 2016-07-07 LAB — KAPPA/LAMBDA LIGHT CHAINS
Kappa free light chain: 151.1 mg/L — ABNORMAL HIGH (ref 3.3–19.4)
Kappa, lambda light chain ratio: 12.09 — ABNORMAL HIGH (ref 0.26–1.65)
LAMDA FREE LIGHT CHAINS: 12.5 mg/L (ref 5.7–26.3)

## 2016-07-07 LAB — IGG, IGA, IGM
IGG (IMMUNOGLOBIN G), SERUM: 3118 mg/dL — AB (ref 700–1600)
IGM, SERUM: 131 mg/dL (ref 26–217)
IgA: 93 mg/dL (ref 64–422)

## 2016-07-15 ENCOUNTER — Telehealth: Payer: Self-pay | Admitting: *Deleted

## 2016-07-15 NOTE — Telephone Encounter (Signed)
These are about her baseline. Keep f/u as scheduled.

## 2016-07-15 NOTE — Telephone Encounter (Signed)
Protein electrophoresis, serum  Order: [id number]  Collected:  07/06/2016 12:20 Status:  Edited Result - FINAL   Ref Range & Units 9d ago  Total Protein ELP 6.0 - 8.5 g/dL 8.7    Albumin ELP 2.9 - 4.4 g/dL 3.7   Alpha-1-Globulin 0.0 - 0.4 g/dL 0.2   Alpha-2-Globulin 0.4 - 1.0 g/dL 0.8   Beta Globulin 0.7 - 1.3 g/dL 0.9   Gamma Globulin 0.4 - 1.8 g/dL 3.0    M-Spike, % Not Observed g/dL 2.5         IgG, IgA, IgM  Order: [id number]  Status:  Final result  Visible to patient:  No (Not Released)  Next appt:  09/01/2016 at 04:00 PM in Urology Hollice Espy, MD)  Dx:  Multiple myeloma, remission status un...   Ref Range & Units 9d ago  IgG (Immunoglobin G), Serum 700 - 1,600 mg/dL 3,118    IgA 64 - 422 mg/dL 93   IgM, Serum 26 - 217 mg/dL 131   Comments: (NOTE)  Performed At: [facility] [place]        Kappa/lambda light chains  Order: [id number]  Status:  Final result  Visible to patient:  No (Not Released)  Next appt:  09/01/2016 at 04:00 PM in Urology Hollice Espy, MD)  Dx:  Multiple myeloma, remission status un...   Ref Range & Units 9d ago  Kappa free light chain 3.3 - 19.4 mg/L 151.1    Lamda free light chains 5.7 - 26.3 mg/L 12.5   Kappa, lamda light chain ratio 0.26 - 1.65 12.09         Please advise and call patient with results. Next appt not until July

## 2016-07-16 NOTE — Telephone Encounter (Signed)
Notified patient of results and she voiced understanding

## 2016-09-01 ENCOUNTER — Other Ambulatory Visit: Payer: Medicare Other | Admitting: Urology

## 2016-09-07 ENCOUNTER — Other Ambulatory Visit: Payer: Medicare Other | Admitting: Urology

## 2016-09-07 ENCOUNTER — Encounter: Payer: Self-pay | Admitting: Urology

## 2016-09-07 ENCOUNTER — Ambulatory Visit (INDEPENDENT_AMBULATORY_CARE_PROVIDER_SITE_OTHER): Payer: Medicare Other | Admitting: Urology

## 2016-09-07 VITALS — BP 100/65 | HR 76 | Ht 60.0 in | Wt 150.0 lb

## 2016-09-07 DIAGNOSIS — C679 Malignant neoplasm of bladder, unspecified: Secondary | ICD-10-CM

## 2016-09-07 LAB — URINALYSIS, COMPLETE
Bilirubin, UA: NEGATIVE
GLUCOSE, UA: NEGATIVE
KETONES UA: NEGATIVE
Nitrite, UA: NEGATIVE
PROTEIN UA: NEGATIVE
RBC, UA: NEGATIVE
Specific Gravity, UA: 1.015 (ref 1.005–1.030)
Urobilinogen, Ur: 0.2 mg/dL (ref 0.2–1.0)
pH, UA: 5 (ref 5.0–7.5)

## 2016-09-07 LAB — MICROSCOPIC EXAMINATION
Epithelial Cells (non renal): >10 /hpf — ABNORMAL HIGH (ref 0–10)
RBC MICROSCOPIC, UA: NONE SEEN /HPF (ref 0–?)

## 2016-09-07 MED ORDER — LIDOCAINE HCL 2 % EX GEL
1.0000 "application " | Freq: Once | CUTANEOUS | Status: AC
  Administered 2016-09-07: 1 via URETHRAL

## 2016-09-07 MED ORDER — CIPROFLOXACIN HCL 500 MG PO TABS
500.0000 mg | ORAL_TABLET | Freq: Once | ORAL | Status: AC
  Administered 2016-09-07: 500 mg via ORAL

## 2016-09-07 NOTE — Progress Notes (Signed)
   09/07/16  CC:  Chief Complaint  Patient presents with  . Cysto    HPI: 82 yo F with known personal history of bladder cancer Who presents today for surveillance cystoscopy.  She had history of bladder cancer dx 19 years ago followed by 6 weeks BCG.  She had routine surveillance cystoscopy last in 2016 since that time.   She had history of bladder cancer dx 19 years ago followed by 6 weeks BCG.  She had routine surveillance cystoscopy annually since that time without recurrence.   Per personal report, she was told her cancer was stage 2 bladder cancer, dx and treated in [place].  It is unclear whether this was squamous or TCC.   She is from [place] and moved here 2 years ago from [place] (live there for 25 years).   She does have a history of "bladder lift" procedure 30 years ago.     Blood pressure 100/65, pulse 76, height 5' (1.524 m), weight 150 lb (68 kg). NED. A&Ox3.   No respiratory distress   Abd soft, NT, ND Normal external genitalia with patent urethral meatus  Cystoscopy Procedure Note  Patient identification was confirmed, informed consent was obtained, and patient was prepped using Betadine solution.  Lidocaine jelly was administered per urethral meatus.    Preoperative abx where received prior to procedure.    Procedure: - Flexible cystoscope introduced, without any difficulty.   - Thorough search of the bladder revealed:    normal urethral meatus    normal urothelium    no stones    no ulcers     no tumors    no urethral polyps    no trabeculation  - Ureteral orifices were normal in position and appearance.  Post-Procedure: - Patient tolerated the procedure well  Assessment/ Plan:  1. Malignant neoplasm of urinary bladder, unspecified site [facility]) No evidence of disease today on cystoscopy.  We discussed the AUA guidelines and indications for surveillance cystoscopy. After 5 years without recurrence, it should be shared decision-making between  the patient and physician. Given that it spend quite some time from her most recent recurrence, I would recommend either cessation of cystoscopy or continue on an annual versus every other year basis.  She is agreeable with this plan.  She'll return sooner if she develops any significant urinary symptoms or hematuria.  - Urinalysis, Complete - ciprofloxacin (CIPRO) tablet 500 mg; Take 1 tablet (500 mg total) by mouth once. - lidocaine (XYLOCAINE) 2 % jelly 1 application; Place 1 application into the urethra once.  Return in about 1 year (around 09/07/2017) for cysto (1-2 years).    Hollice Espy, MD

## 2016-10-07 ENCOUNTER — Inpatient Hospital Stay: Payer: Medicare Other | Attending: Oncology

## 2016-10-07 DIAGNOSIS — K219 Gastro-esophageal reflux disease without esophagitis: Secondary | ICD-10-CM | POA: Insufficient documentation

## 2016-10-07 DIAGNOSIS — Z8551 Personal history of malignant neoplasm of bladder: Secondary | ICD-10-CM | POA: Insufficient documentation

## 2016-10-07 DIAGNOSIS — Z87891 Personal history of nicotine dependence: Secondary | ICD-10-CM | POA: Diagnosis not present

## 2016-10-07 DIAGNOSIS — M129 Arthropathy, unspecified: Secondary | ICD-10-CM | POA: Diagnosis not present

## 2016-10-07 DIAGNOSIS — Z79899 Other long term (current) drug therapy: Secondary | ICD-10-CM | POA: Diagnosis not present

## 2016-10-07 DIAGNOSIS — I129 Hypertensive chronic kidney disease with stage 1 through stage 4 chronic kidney disease, or unspecified chronic kidney disease: Secondary | ICD-10-CM | POA: Insufficient documentation

## 2016-10-07 DIAGNOSIS — N189 Chronic kidney disease, unspecified: Secondary | ICD-10-CM | POA: Diagnosis not present

## 2016-10-07 DIAGNOSIS — C9000 Multiple myeloma not having achieved remission: Secondary | ICD-10-CM | POA: Insufficient documentation

## 2016-10-07 DIAGNOSIS — Z808 Family history of malignant neoplasm of other organs or systems: Secondary | ICD-10-CM | POA: Insufficient documentation

## 2016-10-07 LAB — COMPREHENSIVE METABOLIC PANEL
ALK PHOS: 102 U/L (ref 38–126)
ALT: 24 U/L (ref 14–54)
ANION GAP: 7 (ref 5–15)
AST: 43 U/L — ABNORMAL HIGH (ref 15–41)
Albumin: 3.4 g/dL — ABNORMAL LOW (ref 3.5–5.0)
BILIRUBIN TOTAL: 0.6 mg/dL (ref 0.3–1.2)
BUN: 22 mg/dL — ABNORMAL HIGH (ref 6–20)
CALCIUM: 9 mg/dL (ref 8.9–10.3)
CO2: 24 mmol/L (ref 22–32)
Chloride: 103 mmol/L (ref 101–111)
Creatinine, Ser: 1.3 mg/dL — ABNORMAL HIGH (ref 0.44–1.00)
GFR calc Af Amer: 44 mL/min — ABNORMAL LOW (ref >60)
GFR calc non Af Amer: 38 mL/min — ABNORMAL LOW (ref >60)
Glucose, Bld: 99 mg/dL (ref 65–99)
POTASSIUM: 4 mmol/L (ref 3.5–5.1)
SODIUM: 134 mmol/L — AB (ref 135–145)
Total Protein: 8.4 g/dL — ABNORMAL HIGH (ref 6.5–8.1)

## 2016-10-07 LAB — CBC WITH DIFFERENTIAL/PLATELET
BASOS PCT: 1 %
Basophils Absolute: 0.1 10*3/uL (ref 0–0.1)
EOS ABS: 0 10*3/uL (ref 0–0.7)
Eosinophils Relative: 1 %
HCT: 34.7 % — ABNORMAL LOW (ref 35.0–47.0)
HEMOGLOBIN: 12.3 g/dL (ref 12.0–16.0)
LYMPHS ABS: 1.6 10*3/uL (ref 1.0–3.6)
Lymphocytes Relative: 37 %
MCH: 31.2 pg (ref 26.0–34.0)
MCHC: 35.3 g/dL (ref 32.0–36.0)
MCV: 88.4 fL (ref 80.0–100.0)
MONO ABS: 0.4 10*3/uL (ref 0.2–0.9)
MONOS PCT: 9 %
NEUTROS PCT: 52 %
Neutro Abs: 2.3 10*3/uL (ref 1.4–6.5)
Platelets: 199 10*3/uL (ref 150–440)
RBC: 3.92 MIL/uL (ref 3.80–5.20)
RDW: 12.9 % (ref 11.5–14.5)
WBC: 4.4 10*3/uL (ref 3.6–11.0)

## 2016-10-08 LAB — PROTEIN ELECTROPHORESIS, SERUM
A/G Ratio: 0.8 (ref 0.7–1.7)
ALPHA-1-GLOBULIN: 0.2 g/dL (ref 0.0–0.4)
Albumin ELP: 3.5 g/dL (ref 2.9–4.4)
Alpha-2-Globulin: 0.7 g/dL (ref 0.4–1.0)
Beta Globulin: 1 g/dL (ref 0.7–1.3)
GAMMA GLOBULIN: 2.7 g/dL — AB (ref 0.4–1.8)
Globulin, Total: 4.6 g/dL — ABNORMAL HIGH (ref 2.2–3.9)
M-SPIKE, %: 2.3 g/dL — AB
TOTAL PROTEIN ELP: 8.1 g/dL (ref 6.0–8.5)

## 2016-10-08 LAB — IGG, IGA, IGM
IGG (IMMUNOGLOBIN G), SERUM: 2739 mg/dL — AB (ref 700–1600)
IgA: 87 mg/dL (ref 64–422)
IgM, Serum: 119 mg/dL (ref 26–217)

## 2016-10-08 LAB — KAPPA/LAMBDA LIGHT CHAINS
KAPPA FREE LGHT CHN: 166.7 mg/L — AB (ref 3.3–19.4)
KAPPA, LAMDA LIGHT CHAIN RATIO: 13.13 — AB (ref 0.26–1.65)
LAMDA FREE LIGHT CHAINS: 12.7 mg/L (ref 5.7–26.3)

## 2016-10-12 NOTE — Progress Notes (Signed)
[place]  [phone] [phone]  ID: Julie Khan OB: 14-Nov-1934  MR#: [id number]  IRC#:[id number]  Patient Care Team: Jodi Marble, MD as PCP - General (Internal Medicine)  CHIEF COMPLAINT: Smoldering myeloma  INTERVAL HISTORY: Patient is a 82 year old female with documented smoldering myeloma who is referred for continued observation and management. She currently feels well and is asymptomatic. She has no neurologic complaints. She denies any recent fevers or illnesses. She has a good appetite and denies weight loss. She has no chest pain or shortness of breath. She denies any nausea, vomiting, constipation, or diarrhea. She has no urinary complaints. Patient feels at her baseline and offers no specific complaints today.  REVIEW OF SYSTEMS:   Review of Systems  Constitutional: Negative.  Negative for fever, malaise/fatigue and weight loss.  Respiratory: Negative.  Negative for cough and shortness of breath.   Cardiovascular: Negative.  Negative for chest pain and leg swelling.  Gastrointestinal: Negative.  Negative for abdominal pain, blood in stool and melena.  Genitourinary: Negative.   Musculoskeletal: Negative.   Neurological: Negative.  Negative for sensory change and weakness.  Psychiatric/Behavioral: Negative.  The patient is not nervous/anxious.     As per HPI. Otherwise, a complete review of systems is negative.  PAST MEDICAL HISTORY: Past Medical History:  Diagnosis Date  . Arthritis   . Bladder cancer ([place])   . Cancer ([place])    multiple myeloma  . Chronic kidney disease   . GERD (gastroesophageal reflux disease)   . Hypertension     PAST SURGICAL HISTORY: Past Surgical History:  Procedure Laterality Date  . bladder tumor removed    . COLONOSCOPY WITH PROPOFOL N/A 07/25/2015   Procedure: COLONOSCOPY WITH PROPOFOL;  Surgeon: Josefine Class, MD;  Location: [facility] ENDOSCOPY;  Service: Endoscopy;  Laterality:  N/A;  . ESOPHAGOGASTRODUODENOSCOPY (EGD) WITH PROPOFOL N/A 07/25/2015   Procedure: ESOPHAGOGASTRODUODENOSCOPY (EGD) WITH PROPOFOL;  Surgeon: Josefine Class, MD;  Location: [facility] ENDOSCOPY;  Service: Endoscopy;  Laterality: N/A;  . HERNIA REPAIR      FAMILY HISTORY: Family History  Problem Relation Age of Onset  . Cancer Mother   . Cancer Sister   . Cancer Brother   . Bladder Cancer Neg Hx   . Kidney cancer Neg Hx     ADVANCED DIRECTIVES (Y/N):  N  HEALTH MAINTENANCE: Social History  Substance Use Topics  . Smoking status: Former Research scientist (life sciences)  . Smokeless tobacco: Never Used  . Alcohol use No     Colonoscopy:  PAP:  Bone density:  Lipid panel:  Allergies  Allergen Reactions  . Iodine     seizure    Current Outpatient Prescriptions  Medication Sig Dispense Refill  . clotrimazole (LOTRIMIN) 1 % cream Apply 1 application topically 2 (two) times daily.    Marland Kitchen losartan (COZAAR) 25 MG tablet Take 25 mg by mouth daily.    . metoprolol succinate (TOPROL-XL) 25 MG 24 hr tablet Take 25 mg by mouth daily.    . pantoprazole (PROTONIX) 40 MG tablet Take 40 mg by mouth daily.    . simvastatin (ZOCOR) 20 MG tablet TK 1 T PO QHS  1   No current facility-administered medications for this visit.     OBJECTIVE: Vitals:   10/14/16 1336  BP: 127/82  Pulse: 73  Resp: 20  Temp: (!) 96.8 F (36 C)     Body mass index is 29.04 kg/m.    ECOG FS:0 - Asymptomatic  General: Well-developed, well-nourished, no  acute distress. Eyes: Pink conjunctiva, anicteric sclera. HEENT: Normocephalic, moist mucous membranes, clear oropharnyx. Lungs: Clear to auscultation bilaterally. Heart: Regular rate and rhythm. No rubs, murmurs, or gallops. Abdomen: Soft, nontender, nondistended. No organomegaly noted, normoactive bowel sounds. Musculoskeletal: No edema, cyanosis, or clubbing. Neuro: Alert, answering all questions appropriately. Cranial nerves grossly intact. Skin: No rashes or petechiae  noted. Psych: Normal affect. Lymphatics: No cervical, calvicular, axillary or inguinal LAD.   LAB RESULTS:  Lab Results  Component Value Date   NA 134 (L) 10/07/2016   K 4.0 10/07/2016   CL 103 10/07/2016   CO2 24 10/07/2016   GLUCOSE 99 10/07/2016   BUN 22 (H) 10/07/2016   CREATININE 1.30 (H) 10/07/2016   CALCIUM 9.0 10/07/2016   PROT 8.4 (H) 10/07/2016   ALBUMIN 3.4 (L) 10/07/2016   AST 43 (H) 10/07/2016   ALT 24 10/07/2016   ALKPHOS 102 10/07/2016   BILITOT 0.6 10/07/2016   GFRNONAA 38 (L) 10/07/2016   GFRAA 44 (L) 10/07/2016    Lab Results  Component Value Date   WBC 4.4 10/07/2016   NEUTROABS 2.3 10/07/2016   HGB 12.3 10/07/2016   HCT 34.7 (L) 10/07/2016   MCV 88.4 10/07/2016   PLT 199 10/07/2016   Lab Results  Component Value Date   TOTALPROTELP 8.1 10/07/2016   ALBUMINELP 3.5 10/07/2016   A1GS 0.2 10/07/2016   A2GS 0.7 10/07/2016   BETS 1.0 10/07/2016   GAMS 2.7 (H) 10/07/2016   MSPIKE 2.3 (H) 10/07/2016   SPEI Comment 10/07/2016     STUDIES: No results found.  ASSESSMENT: Smoldering myeloma  PLAN:   1. Smoldering myeloma: Bone marrow biopsy on December 23, 2011 revealed 15% plasma cells with cytogenetics having monosomy 13 and approximately 7%. Skeletal survey on February 20, 2016 was negative for any lesions. Patient's M spike continues to be stable ranging from approximately 2.0-2.5. Her IgG levels are still elevated but have improved from 3118 to 2739. It is unclear what they were prior. Chains continue to be elevated but are stable. She has mild renal insufficiency, but otherwise no evidence of endorgan damage. No intervention is needed at this time. Patient does not require repeat bone marrow biopsy. Return to clinic in 6 months with repeat laboratory work and further evaluation.  2. Renal insufficiency: Unclear patient's baseline, monitor. 3. Hypertension: Resolved.   Patient expressed understanding and was in agreement with this plan. She  also understands that She can call clinic at any time with any questions, concerns, or complaints.    Jacquelin Hawking, NP   10/14/2016 4:06 PM

## 2016-10-14 ENCOUNTER — Inpatient Hospital Stay (HOSPITAL_BASED_OUTPATIENT_CLINIC_OR_DEPARTMENT_OTHER): Payer: Medicare Other | Admitting: Oncology

## 2016-10-14 VITALS — BP 127/82 | HR 73 | Temp 96.8°F | Resp 20 | Wt 148.7 lb

## 2016-10-14 DIAGNOSIS — M129 Arthropathy, unspecified: Secondary | ICD-10-CM

## 2016-10-14 DIAGNOSIS — Z79899 Other long term (current) drug therapy: Secondary | ICD-10-CM | POA: Diagnosis not present

## 2016-10-14 DIAGNOSIS — Z87891 Personal history of nicotine dependence: Secondary | ICD-10-CM

## 2016-10-14 DIAGNOSIS — K219 Gastro-esophageal reflux disease without esophagitis: Secondary | ICD-10-CM

## 2016-10-14 DIAGNOSIS — Z8551 Personal history of malignant neoplasm of bladder: Secondary | ICD-10-CM

## 2016-10-14 DIAGNOSIS — C9000 Multiple myeloma not having achieved remission: Secondary | ICD-10-CM | POA: Diagnosis not present

## 2016-10-14 DIAGNOSIS — N189 Chronic kidney disease, unspecified: Secondary | ICD-10-CM | POA: Diagnosis not present

## 2016-10-14 DIAGNOSIS — I129 Hypertensive chronic kidney disease with stage 1 through stage 4 chronic kidney disease, or unspecified chronic kidney disease: Secondary | ICD-10-CM

## 2016-10-14 DIAGNOSIS — Z808 Family history of malignant neoplasm of other organs or systems: Secondary | ICD-10-CM

## 2016-10-14 NOTE — Progress Notes (Signed)
Patient denies any concerns today.  

## 2016-10-25 ENCOUNTER — Encounter: Payer: Self-pay | Admitting: Internal Medicine

## 2016-12-01 DIAGNOSIS — G2581 Restless legs syndrome: Secondary | ICD-10-CM | POA: Insufficient documentation

## 2016-12-08 ENCOUNTER — Other Ambulatory Visit: Payer: Self-pay | Admitting: Internal Medicine

## 2016-12-08 DIAGNOSIS — Z1231 Encounter for screening mammogram for malignant neoplasm of breast: Secondary | ICD-10-CM

## 2016-12-13 ENCOUNTER — Encounter: Payer: Self-pay | Admitting: *Deleted

## 2016-12-16 ENCOUNTER — Ambulatory Visit: Payer: Medicare Other | Admitting: Anesthesiology

## 2016-12-16 ENCOUNTER — Encounter
Admission: RE | Disposition: A | Discharge status: Home / Self Care | Payer: Self-pay | Source: Ambulatory Visit | Attending: Ophthalmology

## 2016-12-16 ENCOUNTER — Ambulatory Visit
Admission: RE | Admit: 2016-12-16 | Discharge: 2016-12-16 | Disposition: A | Discharge status: Home / Self Care | Payer: Medicare Other | Source: Ambulatory Visit | Attending: Ophthalmology | Admitting: Ophthalmology

## 2016-12-16 DIAGNOSIS — E7800 Pure hypercholesterolemia, unspecified: Secondary | ICD-10-CM | POA: Diagnosis not present

## 2016-12-16 DIAGNOSIS — Z8551 Personal history of malignant neoplasm of bladder: Secondary | ICD-10-CM | POA: Diagnosis not present

## 2016-12-16 DIAGNOSIS — Z888 Allergy status to other drugs, medicaments and biological substances status: Secondary | ICD-10-CM | POA: Insufficient documentation

## 2016-12-16 DIAGNOSIS — I10 Essential (primary) hypertension: Secondary | ICD-10-CM | POA: Insufficient documentation

## 2016-12-16 DIAGNOSIS — H2512 Age-related nuclear cataract, left eye: Secondary | ICD-10-CM | POA: Insufficient documentation

## 2016-12-16 DIAGNOSIS — M810 Age-related osteoporosis without current pathological fracture: Secondary | ICD-10-CM | POA: Diagnosis not present

## 2016-12-16 DIAGNOSIS — Z880 Allergy status to penicillin: Secondary | ICD-10-CM | POA: Insufficient documentation

## 2016-12-16 DIAGNOSIS — K219 Gastro-esophageal reflux disease without esophagitis: Secondary | ICD-10-CM | POA: Diagnosis not present

## 2016-12-16 DIAGNOSIS — C9000 Multiple myeloma not having achieved remission: Secondary | ICD-10-CM | POA: Insufficient documentation

## 2016-12-16 DIAGNOSIS — Z87891 Personal history of nicotine dependence: Secondary | ICD-10-CM | POA: Diagnosis not present

## 2016-12-16 DIAGNOSIS — Z8673 Personal history of transient ischemic attack (TIA), and cerebral infarction without residual deficits: Secondary | ICD-10-CM | POA: Insufficient documentation

## 2016-12-16 DIAGNOSIS — M1990 Unspecified osteoarthritis, unspecified site: Secondary | ICD-10-CM | POA: Insufficient documentation

## 2016-12-16 HISTORY — DX: Dizziness and giddiness: R42

## 2016-12-16 HISTORY — DX: Cerebral infarction, unspecified: I63.9

## 2016-12-16 HISTORY — PX: CATARACT EXTRACTION W/PHACO: SHX586

## 2016-12-16 SURGERY — PHACOEMULSIFICATION, CATARACT, WITH IOL INSERTION
Anesthesia: Monitor Anesthesia Care | Site: Eye | Laterality: Left | Wound class: Clean

## 2016-12-16 MED ORDER — MIDAZOLAM HCL 2 MG/2ML IJ SOLN
INTRAMUSCULAR | Status: AC
  Filled 2016-12-16: qty 2

## 2016-12-16 MED ORDER — NA CHONDROIT SULF-NA HYALURON 40-30 MG/ML IO SOLN
INTRAOCULAR | Status: DC | PRN
  Administered 2016-12-16: 0.5 mL via INTRAOCULAR

## 2016-12-16 MED ORDER — FENTANYL CITRATE (PF) 100 MCG/2ML IJ SOLN
INTRAMUSCULAR | Status: AC
  Filled 2016-12-16: qty 2

## 2016-12-16 MED ORDER — EPINEPHRINE PF 1 MG/ML IJ SOLN
INTRAOCULAR | Status: DC | PRN
  Administered 2016-12-16: 10:00:00 via OPHTHALMIC

## 2016-12-16 MED ORDER — MOXIFLOXACIN HCL 0.5 % OP SOLN: 1.0000 [drp] | OPHTHALMIC | Status: DC | PRN

## 2016-12-16 MED ORDER — LIDOCAINE HCL (PF) 4 % IJ SOLN
INTRAMUSCULAR | Status: DC | PRN
  Administered 2016-12-16: 4 mL via OPHTHALMIC

## 2016-12-16 MED ORDER — MOXIFLOXACIN HCL 0.5 % OP SOLN
OPHTHALMIC | Status: AC
  Filled 2016-12-16: qty 3

## 2016-12-16 MED ORDER — POVIDONE-IODINE 5 % OP SOLN
OPHTHALMIC | Status: DC | PRN
  Administered 2016-12-16: 1 via OPHTHALMIC

## 2016-12-16 MED ORDER — SODIUM CHLORIDE 0.9 % IV SOLN
INTRAVENOUS | Status: DC
  Administered 2016-12-16: 09:00:00 via INTRAVENOUS

## 2016-12-16 MED ORDER — ARMC OPHTHALMIC DILATING DROPS
1.0000 "application " | OPHTHALMIC | Status: AC
  Administered 2016-12-16 (×3): 1 via OPHTHALMIC

## 2016-12-16 MED ORDER — ARMC OPHTHALMIC DILATING DROPS
OPHTHALMIC | Status: AC
  Administered 2016-12-16: 1 via OPHTHALMIC
  Filled 2016-12-16: qty 0.4

## 2016-12-16 MED ORDER — MIDAZOLAM HCL 2 MG/2ML IJ SOLN
INTRAMUSCULAR | Status: DC | PRN
  Administered 2016-12-16: 1 mg via INTRAVENOUS

## 2016-12-16 MED ORDER — MOXIFLOXACIN HCL 0.5 % OP SOLN
OPHTHALMIC | Status: DC | PRN
  Administered 2016-12-16: 0.2 mL via OPHTHALMIC

## 2016-12-16 MED ORDER — CARBACHOL 0.01 % IO SOLN
INTRAOCULAR | Status: DC | PRN
  Administered 2016-12-16: 0.5 mL via INTRAOCULAR

## 2016-12-16 MED ORDER — FENTANYL CITRATE (PF) 100 MCG/2ML IJ SOLN
INTRAMUSCULAR | Status: DC | PRN
  Administered 2016-12-16: 50 ug via INTRAVENOUS

## 2016-12-16 SURGICAL SUPPLY — 16 items
DISSECTOR HYDRO NUCLEUS 50X22 (MISCELLANEOUS) ×3 IMPLANT
GLOVE BIO SURGEON STRL SZ8 (GLOVE) ×3 IMPLANT
GLOVE BIOGEL M 6.5 STRL (GLOVE) ×3 IMPLANT
GLOVE SURG LX 7.5 STRW (GLOVE) ×2
GLOVE SURG LX STRL 7.5 STRW (GLOVE) ×1 IMPLANT
GOWN STRL REUS W/ TWL LRG LVL3 (GOWN DISPOSABLE) ×2 IMPLANT
GOWN STRL REUS W/TWL LRG LVL3 (GOWN DISPOSABLE) ×4
LABEL CATARACT MEDS ST (LABEL) ×3 IMPLANT
LENS IOL TECNIS ITEC 24.0 (Intraocular Lens) ×3 IMPLANT
PACK CATARACT (MISCELLANEOUS) ×3 IMPLANT
PACK CATARACT KING (MISCELLANEOUS) ×3 IMPLANT
PACK EYE AFTER SURG (MISCELLANEOUS) ×3 IMPLANT
SOL BSS BAG (MISCELLANEOUS) ×3
SOLUTION BSS BAG (MISCELLANEOUS) ×1 IMPLANT
WATER STERILE IRR 250ML POUR (IV SOLUTION) ×3 IMPLANT
WIPE NON LINTING 3.25X3.25 (MISCELLANEOUS) ×3 IMPLANT

## 2016-12-16 NOTE — Op Note (Signed)
OPERATIVE NOTE  Julie Khan [id number] 12/16/2016   PREOPERATIVE DIAGNOSIS:  Nuclear sclerotic cataract left eye.  H25.12   POSTOPERATIVE DIAGNOSIS:    Nuclear sclerotic cataract left eye.     PROCEDURE:  Phacoemusification with posterior chamber intraocular lens placement of the left eye   LENS:   Implant Name Type Inv. Item Serial No. Manufacturer Lot No. LRB No. Used  LENS IOL DIOP 24.0 - [id number] Intraocular Lens LENS IOL DIOP 24.0 [id number] AMO   Left 1       PCB00 +24.0   ULTRASOUND TIME: 0 minutes 38 seconds.  CDE 5.54   SURGEON:  Benay Pillow, MD, MPH   ANESTHESIA:  Topical with tetracaine drops augmented with 1% preservative-free intracameral lidocaine.  ESTIMATED BLOOD LOSS: <1 mL   COMPLICATIONS:  None.   DESCRIPTION OF PROCEDURE:  The patient was identified in the holding room and transported to the operating room and placed in the supine position under the operating microscope.  The left eye was identified as the operative eye and it was prepped and draped in the usual sterile ophthalmic fashion.   A 1.0 millimeter clear-corneal paracentesis was made at the 5:00 position. 0.5 ml of preservative-free 1% lidocaine with epinephrine was injected into the anterior chamber.  The anterior chamber was filled with Discovisc viscoelastic.  A 2.4 millimeter keratome was used to make a near-clear corneal incision at the 2:00 position.  A curvilinear capsulorrhexis was made with a cystotome and capsulorrhexis forceps.  Balanced salt solution was used to hydrodissect and hydrodelineate the nucleus.   Phacoemulsification was then used in stop and chop fashion to remove the lens nucleus and epinucleus.  The remaining cortex was then removed using the irrigation and aspiration handpiece. Discovisc was then placed into the capsular bag to distend it for lens placement.  A lens was then injected into the capsular bag.  The remaining viscoelastic was aspirated.   Wounds were  hydrated with balanced salt solution.  The anterior chamber was inflated to a physiologic pressure with balanced salt solution.  Intracameral vigamox 0.1 mL undiltued was injected into the eye and a drop placed onto the ocular surface.  No wound leaks were noted.  The patient was taken to the recovery room in stable condition without complications of anesthesia or surgery  Benay Pillow 12/16/2016, 10:26 AM

## 2016-12-16 NOTE — Anesthesia Preprocedure Evaluation (Signed)
Anesthesia Evaluation  Patient identified by MRN, date of birth, ID band Patient awake    Reviewed: Allergy & Precautions, H&P , NPO status , Patient's Chart, lab work & pertinent test results, reviewed documented beta blocker date and time   Airway Mallampati: II  TM Distance: >3 FB Neck ROM: full    Dental no notable dental hx. (+) Teeth Intact   Pulmonary neg pulmonary ROS, former smoker,    Pulmonary exam normal breath sounds clear to auscultation       Cardiovascular Exercise Tolerance: Good hypertension, negative cardio ROS   Rhythm:regular Rate:Normal     Neuro/Psych CVA, No Residual Symptoms negative neurological ROS  negative psych ROS   GI/Hepatic negative GI ROS, Neg liver ROS, GERD  Medicated,  Endo/Other  negative endocrine ROSdiabetes  Renal/GU Renal disease     Musculoskeletal   Abdominal   Peds  Hematology negative hematology ROS (+)   Anesthesia Other Findings   Reproductive/Obstetrics negative OB ROS                             Anesthesia Physical Anesthesia Plan  ASA: III  Anesthesia Plan: MAC   Post-op Pain Management:    Induction:   PONV Risk Score and Plan: 2 and Ondansetron and Dexamethasone  Airway Management Planned:   Additional Equipment:   Intra-op Plan:   Post-operative Plan:   Informed Consent: I have reviewed the patients History and Physical, chart, labs and discussed the procedure including the risks, benefits and alternatives for the proposed anesthesia with the patient or authorized representative who has indicated his/her understanding and acceptance.     Plan Discussed with: CRNA  Anesthesia Plan Comments:         Anesthesia Quick Evaluation

## 2016-12-16 NOTE — Discharge Instructions (Signed)
Eye Surgery Discharge Instructions  Expect mild scratchy sensation or mild soreness. DO NOT RUB YOUR EYE!  The day of surgery:  Minimal physical activity, but bed rest is not required  No reading, computer work, or close hand work  No bending, lifting, or straining.  May watch TV  For 24 hours:  No driving, legal decisions, or alcoholic beverages  Safety precautions  Eat anything you prefer: It is better to start with liquids, then soup then solid foods.  _____ Eye patch should be worn until postoperative exam tomorrow.  ____ Solar shield eyeglasses should be worn for comfort in the sunlight/patch while sleeping  Resume all regular medications including aspirin or Coumadin if these were discontinued prior to surgery. You may shower, bathe, shave, or wash your hair. Tylenol may be taken for mild discomfort.  Call your doctor if you experience significant pain, nausea, or vomiting, fever > 101 or other signs of infection. [phone] or [phone] Specific instructions:  Follow-up Information    Eulogio Bear, MD Follow up.   Specialty:  Ophthalmology Why:  September 14 10:30am at [facility] information: [address] [place] [address] [phone]

## 2016-12-16 NOTE — Anesthesia Post-op Follow-up Note (Signed)
Anesthesia QCDR form completed.        

## 2016-12-16 NOTE — Progress Notes (Signed)
While rounding, [place] made initial visit Pt is SDS. Pt was awaiting her procedure. Daughter is bedside. Amelia spent some time in conversation. Seeing Pt was in good hands, CH excused himself. [place] will follow up post op.   12/16/16 0900  Clinical Encounter Type  Visited With Patient;Patient and family together;Health care provider  Visit Type Initial;Spiritual support;Pre-op  Consult/Referral To Chaplain

## 2016-12-16 NOTE — Transfer of Care (Signed)
Immediate Anesthesia Transfer of Care Note  Patient: Julie Khan  Procedure(s) Performed: Procedure(s) with comments: CATARACT EXTRACTION PHACO AND INTRAOCULAR LENS PLACEMENT (IOC) (Left) - [place] 00:38.0 AP% 14.6 CDE 5.54 Fluid pack lot # [id number]  Patient Location: PACU and Short Stay  Anesthesia Type:MAC  Level of Consciousness: awake, alert  and oriented  Airway & Oxygen Therapy: Patient Spontanous Breathing  Post-op Assessment: Report given to RN  Post vital signs: Reviewed and stable  Last Vitals:  Vitals:   12/16/16 1024 12/16/16 1029  BP: 128/76 128/76  Pulse: 61 64  Resp: 16 18  Temp: (!) 36.2 C (!) 36 C  SpO2: 98% 99%    Last Pain:  Vitals:   12/16/16 0851  TempSrc: Tympanic         Complications: No apparent anesthesia complications

## 2016-12-16 NOTE — H&P (Signed)
The History and Physical notes are on paper, have been signed, and are to be scanned.   I have examined the patient and there are no changes to the H&P.   Julie Khan 12/16/2016 9:50 AM]

## 2016-12-20 NOTE — Anesthesia Postprocedure Evaluation (Signed)
Anesthesia Post Note  Patient: Julie Khan  Procedure(s) Performed: Procedure(s) (LRB): CATARACT EXTRACTION PHACO AND INTRAOCULAR LENS PLACEMENT (IOC) (Left)  Patient location during evaluation: PACU Anesthesia Type: MAC Level of consciousness: awake and alert Pain management: pain level controlled Vital Signs Assessment: post-procedure vital signs reviewed and stable Respiratory status: spontaneous breathing, nonlabored ventilation, respiratory function stable and patient connected to nasal cannula oxygen Cardiovascular status: stable and blood pressure returned to baseline Postop Assessment: no apparent nausea or vomiting Anesthetic complications: no     Last Vitals:  Vitals:   12/16/16 1029 12/16/16 1042  BP: 128/76 (!) 150/74  Pulse: 64   Resp: 18   Temp: (!) 36 C   SpO2: 99%     Last Pain:  Vitals:   12/16/16 0851  TempSrc: Tympanic                 Molli Barrows

## 2016-12-24 ENCOUNTER — Ambulatory Visit
Admission: RE | Admit: 2016-12-24 | Discharge: 2016-12-24 | Disposition: A | Discharge status: Home / Self Care | Payer: Medicare Other | Source: Ambulatory Visit | Attending: Internal Medicine | Admitting: Internal Medicine

## 2016-12-24 DIAGNOSIS — Z1231 Encounter for screening mammogram for malignant neoplasm of breast: Secondary | ICD-10-CM | POA: Diagnosis not present

## 2017-01-06 ENCOUNTER — Other Ambulatory Visit: Payer: Medicare Other

## 2017-01-06 ENCOUNTER — Ambulatory Visit: Payer: Medicare Other | Admitting: Oncology

## 2017-01-11 ENCOUNTER — Encounter (INDEPENDENT_AMBULATORY_CARE_PROVIDER_SITE_OTHER): Payer: Self-pay | Admitting: Vascular Surgery

## 2017-01-11 ENCOUNTER — Ambulatory Visit (INDEPENDENT_AMBULATORY_CARE_PROVIDER_SITE_OTHER): Payer: Medicare Other | Admitting: Vascular Surgery

## 2017-01-11 VITALS — BP 121/77 | HR 76 | Resp 16 | Ht 60.0 in | Wt 149.0 lb

## 2017-01-11 DIAGNOSIS — I6529 Occlusion and stenosis of unspecified carotid artery: Secondary | ICD-10-CM | POA: Insufficient documentation

## 2017-01-11 DIAGNOSIS — I6523 Occlusion and stenosis of bilateral carotid arteries: Secondary | ICD-10-CM | POA: Diagnosis not present

## 2017-01-11 DIAGNOSIS — I10 Essential (primary) hypertension: Secondary | ICD-10-CM

## 2017-01-11 DIAGNOSIS — R42 Dizziness and giddiness: Secondary | ICD-10-CM

## 2017-01-11 NOTE — Assessment & Plan Note (Signed)
blood pressure control important in reducing the progression of atherosclerotic disease. On appropriate oral medications.  

## 2017-01-11 NOTE — Assessment & Plan Note (Signed)
Discussed that this is unlikely for moderate carotid disease which is unilateral and only mild disease on the left. Discussed other possibilities including cardiac disease or blood pressure irregularities.

## 2017-01-11 NOTE — Patient Instructions (Signed)
Carotid Artery Disease The carotid arteries are arteries on both sides of the neck. They carry blood to the brain. Carotid artery disease is when the arteries get smaller (narrow) or get blocked. If these arteries get smaller or get blocked, you are more likely to have a stroke or warning stroke (transient ischemic attack). Follow these instructions at home:  Take medicines as told by your doctor. Make sure you understand all your medicine instructions. Do not stop your medicines without talking to your doctor first.  Follow your doctor's diet instructions. It is important to eat a healthy diet that includes plenty of: ? Fresh fruits. ? Vegetables. ? Lean meats.  Avoid: ? High-fat foods. ? High-sodium foods. ? Foods that are fried, overly processed, or have poor nutritional value.  Stay a healthy weight.  Stay active. Get at least 30 minutes of activity every day.  Do not smoke.  Limit alcohol use to: ? No more than 2 drinks a day for men. ? No more than 1 drink a day for women who are not pregnant.  Do not use illegal drugs.  Keep all doctor visits as told. Get help right away if:  You have sudden weakness or loss of feeling (numbness) on one side of the body, such as the face, arm, or leg.  You have sudden confusion.  You have trouble speaking (aphasia) or understanding.  You have sudden trouble seeing out of one or both eyes.  You have sudden trouble walking.  You have dizziness or feel like you might pass out (faint).  You have a loss of balance or your movements are not steady (uncoordinated).  You have a sudden, severe headache with no known cause.  You have trouble swallowing (dysphagia). Call your local emergency services (911 in [place] Do notdrive yourself to the clinic or hospital. This information is not intended to replace advice given to you by your health care provider. Make sure you discuss any questions you have with your health care  provider. Document Released: 03/08/2012 Document Revised: 08/28/2015 Document Reviewed: 09/20/2012 Elsevier Interactive Patient Education  2018 Elsevier Inc.  

## 2017-01-11 NOTE — Assessment & Plan Note (Signed)
carotid duplex which I have reviewed. This demonstrates velocities in the moderate range on the right (50-70%), and in mild less than 50% range on the left. Both vertebral arteries were antegrade with normal flow. Recommend:  Given the patient's asymptomatic subcritical stenosis no further invasive testing or surgery at this time.  Continue antiplatelet therapy as prescribed and Zocor Continue management of CAD, HTN and Hyperlipidemia Healthy heart diet,  encouraged exercise at least 4 times per week Follow up in 6 months with duplex ultrasound and physical exam based on >50% stenosis of the right carotid artery

## 2017-01-11 NOTE — Progress Notes (Signed)
Patient ID: Julie Khan, female   DOB: 1934/05/01, 82 y.o.   MRN: [id number]  Chief Complaint  Patient presents with  . New Patient (Initial Visit)    carotid stenosis    HPI Julie Khan is a 82 y.o. female.  I am asked to see the patient by Dr. Elijio Miles for evaluation of carotid artery stenosis.  The patient reports Several episodes over the past 5 years or so where she developed dizziness, numbness and tingling in her upper arms and neck area, and a lightheaded feeling that were unprovoked. These have all resolved spontaneously within several minutes. She had 1 episode where she went to the emergency room and was worked up for stroke but this was found to be negative. She reports no current arm or leg weakness or numbness, speech or swallowing difficulty, or temporary monocular blindness. Her primary care physician performed a carotid duplex which I have reviewed. This demonstrates velocities in the moderate range on the right (50-70%), and in mild less than 50% range on the left. Both vertebral arteries were antegrade with normal flow.   Past Medical History:  Diagnosis Date  . Arthritis   . Bladder cancer ([place])   . Cancer ([place])    multiple myeloma  . Chronic kidney disease   . Dizziness   . GERD (gastroesophageal reflux disease)   . Hypertension   . Stroke ([place])    tia x 2    Past Surgical History:  Procedure Laterality Date  . bladder tumor removed    . BREAST EXCISIONAL BIOPSY Bilateral 1970   Benign  . BREAST SURGERY     bx  . CATARACT EXTRACTION W/PHACO Left 12/16/2016   Procedure: CATARACT EXTRACTION PHACO AND INTRAOCULAR LENS PLACEMENT (IOC);  Surgeon: Eulogio Bear, MD;  Location: [facility] ORS;  Service: Ophthalmology;  Laterality: Left;  [place] 00:38.0 AP% 14.6 CDE 5.54 Fluid pack lot # [id number]  . COLONOSCOPY WITH PROPOFOL N/A 07/25/2015   Procedure: COLONOSCOPY WITH PROPOFOL;  Surgeon: Josefine Class, MD;  Location: [facility] ENDOSCOPY;  Service:  Endoscopy;  Laterality: N/A;  . ESOPHAGOGASTRODUODENOSCOPY (EGD) WITH PROPOFOL N/A 07/25/2015   Procedure: ESOPHAGOGASTRODUODENOSCOPY (EGD) WITH PROPOFOL;  Surgeon: Josefine Class, MD;  Location[facility] END[facility]COPY;  Service: Endoscopy;  Laterality: N/A;  . HERNIA REPAIR      Family History  Problem Relation Age of Onset  . Cancer Mother   . Cancer Sister   . Breast cancer Sister 67  . Cancer Brother   . Bladder Cancer Neg Hx   . Kidney cancer Neg Hx     Social History Social History  Substance Use Topics  . Smoking status: Former Research scientist (life sciences)  . Smokeless tobacco: Never Used  . Alcohol use No  No IVDU  Allergies  Allergen Reactions  . Iodine     seizure  . Penicillins     Current Outpatient Prescriptions  Medication Sig Dispense Refill  . losartan (COZAAR) 25 MG tablet Take 25 mg by mouth daily.    . metoprolol succinate (TOPROL-XL) 25 MG 24 hr tablet Take 25 mg by mouth daily.    . pantoprazole (PROTONIX) 40 MG tablet Take 40 mg by mouth daily.    . simvastatin (ZOCOR) 20 MG tablet Take 49m by mouth once daily  1  . oxazepam (SERAX) 10 MG capsule Take 10 mg by mouth at bedtime as needed for sleep or anxiety.     No current facility-administered medications for this visit.  REVIEW OF SYSTEMS (Negative unless checked)  Constitutional: _0 Weight loss  _1 Fever  _2 Chills Cardiac: _3 Chest pain   _4 Chest pressure   _5 Palpitations   _6 Shortness of breath when laying flat   _7 Shortness of breath at rest   _8 Shortness of breath with exertion. Vascular:  _9 Pain in legs with walking   _10 Pain in legs at rest   _11 Pain in legs when laying flat   _12 Claudication   _13 Pain in feet when walking  _14 Pain in feet at rest  _15 Pain in feet when laying flat   _16 History of DVT   _17 Phlebitis   _18 Swelling in legs   _19 Varicose veins   _20 Non-healing ulcers Pulmonary:   _21 Uses home oxygen   _22 Productive cough   _23 Hemoptysis   _24 Wheeze  _25 COPD   _26 Asthma Neurologic:  _27 Dizziness  _28 Blackouts    _29 Seizures   _30 History of stroke   _31 History of TIA  _32 Aphasia   _33 Temporary blindness   _34 Dysphagia   _35 Weakness or numbness in arms   _36 Weakness or numbness in legs Musculoskeletal:  _37 Arthritis   _38 Joint swelling   _39 Joint pain   _40 Low back pain Hematologic:  _41 Easy bruising  _42 Easy bleeding   _43 Hypercoagulable state   _44 Anemic  _45 Hepatitis Gastrointestinal:  _46 Blood in stool   _47 Vomiting blood  _48 Gastroesophageal reflux/heartburn   _49 Abdominal pain Genitourinary:  _50 Chronic kidney disease   _51 Difficult urination  _52 Frequent urination  _53 Burning with urination   _54 Hematuria Skin:  _55 Rashes   _56 Ulcers   _57 Wounds Psychological:  _58 History of anxiety   _59  History of major depression.    Physical Exam BP 121/77 (BP Location: Right Arm)   Pulse 76   Resp 16   Ht 5' (1.524 m)   Wt 67.6 kg (149 lb)   BMI 29.10 kg/m  Gen:  WD/WN, NAD. Appears much younger than stated age Head: [place]/AT, No temporalis wasting.  Ear/Nose/Throat: Hearing grossly intact, nares w/o erythema or drainage, oropharynx w/o Erythema/Exudate Eyes: Conjunctiva clear, sclera non-icteric  Neck: trachea midline.  No JVD. Soft right carotid bruit Pulmonary:  Good air movement, clear to auscultation bilaterally.  Cardiac: RRR, normal S1, S2 Vascular:  Vessel Right Left  Radial Palpable Palpable                          PT Palpable Palpable  DP Palpable Palpable   Gastrointestinal: soft, non-tender/non-distended.  Musculoskeletal: M/S 5/5 throughout.  Extremities without ischemic changes.  No deformity or atrophy.  Neurologic: Sensation grossly intact in extremities.  Symmetrical.  Speech is fluent. Motor exam as listed above. Psychiatric: Judgment intact, Mood & affect appropriate for pt's clinical situation. Dermatologic: No rashes or ulcers noted.  No cellulitis or open wounds.    Radiology Mm Digital Screening Bilateral  Result Date: 01/10/2017 CLINICAL DATA:  Screening. EXAM: DIGITAL SCREENING  BILATERAL MAMMOGRAM WITH CAD COMPARISON:  None. ACR Breast Density Category c: The breast tissue is heterogeneously dense, which may obscure small masses FINDINGS: There are no findings suspicious for malignancy. Images were processed with CAD. IMPRESSION: No mammographic evidence of malignancy. A result letter of this screening mammogram will be mailed directly to the patient. RECOMMENDATION: Screening mammogram in one year. (Code:SM-B-01Y) BI-RADS CATEGORY  1: Negative. Electronically Signed   By: Franki Cabot M.D.   On: 01/10/2017 13:09    Labs No results found for this or any previous visit (from the past 2160 hour(s)).  Assessment/Plan:  Hypertension blood pressure control important in reducing the progression of atherosclerotic disease. On appropriate oral  medications.   Dizziness Discussed that this is unlikely for moderate carotid disease which is unilateral and only mild disease on the left. Discussed other possibilities including cardiac disease or blood pressure irregularities.  Carotid stenosis carotid duplex which I have reviewed. This demonstrates velocities in the moderate range on the right (50-70%), and in mild less than 50% range on the left. Both vertebral arteries were antegrade with normal flow. Recommend:  Given the patient's asymptomatic subcritical stenosis no further invasive testing or surgery at this time.  Continue antiplatelet therapy as prescribed and Zocor Continue management of CAD, HTN and Hyperlipidemia Healthy heart diet,  encouraged exercise at least 4 times per week Follow up in 6 months with duplex ultrasound and physical exam based on >50% stenosis of the right carotid artery          Leotis Pain 01/11/2017, 3:39 PM   This note was created with Dragon medical transcription system.  Any errors from dictation are unintentional.

## 2017-01-13 ENCOUNTER — Ambulatory Visit: Payer: Medicare Other | Admitting: Oncology

## 2017-02-22 ENCOUNTER — Encounter: Payer: Self-pay | Admitting: *Deleted

## 2017-03-03 ENCOUNTER — Encounter: Payer: Self-pay | Admitting: Emergency Medicine

## 2017-03-03 ENCOUNTER — Ambulatory Visit: Payer: Medicare Other | Admitting: Anesthesiology

## 2017-03-03 ENCOUNTER — Ambulatory Visit
Admission: RE | Admit: 2017-03-03 | Discharge: 2017-03-03 | Disposition: A | Discharge status: Home / Self Care | Payer: Medicare Other | Source: Ambulatory Visit | Attending: Ophthalmology | Admitting: Ophthalmology

## 2017-03-03 ENCOUNTER — Encounter
Admission: RE | Disposition: A | Discharge status: Home / Self Care | Payer: Self-pay | Source: Ambulatory Visit | Attending: Ophthalmology

## 2017-03-03 DIAGNOSIS — H2511 Age-related nuclear cataract, right eye: Secondary | ICD-10-CM | POA: Insufficient documentation

## 2017-03-03 DIAGNOSIS — Z880 Allergy status to penicillin: Secondary | ICD-10-CM | POA: Diagnosis not present

## 2017-03-03 DIAGNOSIS — K529 Noninfective gastroenteritis and colitis, unspecified: Secondary | ICD-10-CM | POA: Insufficient documentation

## 2017-03-03 DIAGNOSIS — Z8551 Personal history of malignant neoplasm of bladder: Secondary | ICD-10-CM | POA: Insufficient documentation

## 2017-03-03 DIAGNOSIS — E119 Type 2 diabetes mellitus without complications: Secondary | ICD-10-CM | POA: Insufficient documentation

## 2017-03-03 DIAGNOSIS — Z79899 Other long term (current) drug therapy: Secondary | ICD-10-CM | POA: Insufficient documentation

## 2017-03-03 DIAGNOSIS — Z8673 Personal history of transient ischemic attack (TIA), and cerebral infarction without residual deficits: Secondary | ICD-10-CM | POA: Diagnosis not present

## 2017-03-03 DIAGNOSIS — I739 Peripheral vascular disease, unspecified: Secondary | ICD-10-CM | POA: Insufficient documentation

## 2017-03-03 DIAGNOSIS — C9000 Multiple myeloma not having achieved remission: Secondary | ICD-10-CM | POA: Diagnosis not present

## 2017-03-03 DIAGNOSIS — Z888 Allergy status to other drugs, medicaments and biological substances status: Secondary | ICD-10-CM | POA: Diagnosis not present

## 2017-03-03 DIAGNOSIS — M810 Age-related osteoporosis without current pathological fracture: Secondary | ICD-10-CM | POA: Insufficient documentation

## 2017-03-03 DIAGNOSIS — K219 Gastro-esophageal reflux disease without esophagitis: Secondary | ICD-10-CM | POA: Insufficient documentation

## 2017-03-03 DIAGNOSIS — Z87891 Personal history of nicotine dependence: Secondary | ICD-10-CM | POA: Insufficient documentation

## 2017-03-03 DIAGNOSIS — I10 Essential (primary) hypertension: Secondary | ICD-10-CM | POA: Insufficient documentation

## 2017-03-03 DIAGNOSIS — M1990 Unspecified osteoarthritis, unspecified site: Secondary | ICD-10-CM | POA: Diagnosis not present

## 2017-03-03 HISTORY — PX: CATARACT EXTRACTION W/PHACO: SHX586

## 2017-03-03 SURGERY — PHACOEMULSIFICATION, CATARACT, WITH IOL INSERTION
Anesthesia: Monitor Anesthesia Care | Site: Eye | Laterality: Right | Wound class: Clean

## 2017-03-03 MED ORDER — SODIUM CHLORIDE 0.9 % IV SOLN
INTRAVENOUS | Status: DC
  Administered 2017-03-03 (×2): via INTRAVENOUS

## 2017-03-03 MED ORDER — POVIDONE-IODINE 5 % OP SOLN
OPHTHALMIC | Status: DC | PRN
  Administered 2017-03-03: 1 via OPHTHALMIC

## 2017-03-03 MED ORDER — BSS IO SOLN
INTRAOCULAR | Status: DC | PRN
  Administered 2017-03-03: 4 mL via OPHTHALMIC

## 2017-03-03 MED ORDER — MIDAZOLAM HCL 2 MG/2ML IJ SOLN
INTRAMUSCULAR | Status: AC
  Filled 2017-03-03: qty 2

## 2017-03-03 MED ORDER — SODIUM HYALURONATE 23 MG/ML IO SOLN
INTRAOCULAR | Status: AC
  Filled 2017-03-03: qty 0.6

## 2017-03-03 MED ORDER — MOXIFLOXACIN HCL 0.5 % OP SOLN: 1.0000 [drp] | OPHTHALMIC | Status: DC | PRN

## 2017-03-03 MED ORDER — MOXIFLOXACIN HCL 0.5 % OP SOLN
OPHTHALMIC | Status: AC
  Filled 2017-03-03: qty 3

## 2017-03-03 MED ORDER — POVIDONE-IODINE 5 % OP SOLN
OPHTHALMIC | Status: AC
  Filled 2017-03-03: qty 30

## 2017-03-03 MED ORDER — FENTANYL CITRATE (PF) 100 MCG/2ML IJ SOLN
INTRAMUSCULAR | Status: DC | PRN
  Administered 2017-03-03: 50 ug via INTRAVENOUS

## 2017-03-03 MED ORDER — ARMC OPHTHALMIC DILATING DROPS
1.0000 "application " | OPHTHALMIC | Status: AC
  Administered 2017-03-03 (×3): 1 via OPHTHALMIC

## 2017-03-03 MED ORDER — ARMC OPHTHALMIC DILATING DROPS
OPHTHALMIC | Status: AC
  Administered 2017-03-03: 1 via OPHTHALMIC
  Filled 2017-03-03: qty 0.4

## 2017-03-03 MED ORDER — SODIUM HYALURONATE 10 MG/ML IO SOLN
INTRAOCULAR | Status: DC | PRN
  Administered 2017-03-03: 0.55 mL via INTRAOCULAR

## 2017-03-03 MED ORDER — FENTANYL CITRATE (PF) 100 MCG/2ML IJ SOLN
INTRAMUSCULAR | Status: AC
  Filled 2017-03-03: qty 2

## 2017-03-03 MED ORDER — SODIUM HYALURONATE 23 MG/ML IO SOLN
INTRAOCULAR | Status: DC | PRN
  Administered 2017-03-03: 0.6 mL via INTRAOCULAR

## 2017-03-03 MED ORDER — BSS IO SOLN
INTRAOCULAR | Status: DC | PRN
  Administered 2017-03-03: 1 via INTRAOCULAR

## 2017-03-03 MED ORDER — EPINEPHRINE PF 1 MG/ML IJ SOLN
INTRAMUSCULAR | Status: AC
  Filled 2017-03-03: qty 1

## 2017-03-03 MED ORDER — LIDOCAINE HCL (PF) 4 % IJ SOLN
INTRAMUSCULAR | Status: AC
  Filled 2017-03-03: qty 5

## 2017-03-03 MED ORDER — MIDAZOLAM HCL 2 MG/2ML IJ SOLN
INTRAMUSCULAR | Status: DC | PRN
  Administered 2017-03-03: 1 mg via INTRAVENOUS

## 2017-03-03 MED ORDER — MOXIFLOXACIN HCL 0.5 % OP SOLN
OPHTHALMIC | Status: DC | PRN
  Administered 2017-03-03: 0.2 mL via OPHTHALMIC

## 2017-03-03 SURGICAL SUPPLY — 16 items
DISSECTOR HYDRO NUCLEUS 50X22 (MISCELLANEOUS) ×3 IMPLANT
GLOVE BIO SURGEON STRL SZ8 (GLOVE) ×3 IMPLANT
GLOVE BIOGEL M 6.5 STRL (GLOVE) ×3 IMPLANT
GLOVE SURG LX 7.5 STRW (GLOVE) ×2
GLOVE SURG LX STRL 7.5 STRW (GLOVE) ×1 IMPLANT
GOWN STRL REUS W/ TWL LRG LVL3 (GOWN DISPOSABLE) ×2 IMPLANT
GOWN STRL REUS W/TWL LRG LVL3 (GOWN DISPOSABLE) ×4
LABEL CATARACT MEDS ST (LABEL) ×3 IMPLANT
LENS IOL TECNIS ITEC 23.5 (Intraocular Lens) ×3 IMPLANT
PACK CATARACT (MISCELLANEOUS) ×3 IMPLANT
PACK CATARACT KING (MISCELLANEOUS) ×3 IMPLANT
PACK EYE AFTER SURG (MISCELLANEOUS) ×3 IMPLANT
SOL BSS BAG (MISCELLANEOUS) ×3
SOLUTION BSS BAG (MISCELLANEOUS) ×1 IMPLANT
WATER STERILE IRR 250ML POUR (IV SOLUTION) ×3 IMPLANT
WIPE NON LINTING 3.25X3.25 (MISCELLANEOUS) ×3 IMPLANT

## 2017-03-03 NOTE — Op Note (Signed)
OPERATIVE NOTE  Julie Khan [id number] 03/03/2017   PREOPERATIVE DIAGNOSIS:  Nuclear sclerotic cataract right eye.  H25.11   POSTOPERATIVE DIAGNOSIS:    Nuclear sclerotic cataract right eye.     PROCEDURE:  Phacoemusification with posterior chamber intraocular lens placement of the right eye   LENS:   Implant Name Type Inv. Item Serial No. Manufacturer Lot No. LRB No. Used  LENS IOL DIOP 23.5 - [id number] Intraocular Lens LENS IOL DIOP 23.5 [id number] AMO  Right 1       PCB00 +23.5   ULTRASOUND TIME: 0 minutes 29.8 seconds.  CDE 3.01   SURGEON:  Benay Pillow, MD, MPH  ANESTHESIOLOGIST: No anesthesia staff entered.   ANESTHESIA:  Topical with tetracaine drops augmented with 1% preservative-free intracameral lidocaine.  ESTIMATED BLOOD LOSS: less than 1 mL.   COMPLICATIONS:  None.   DESCRIPTION OF PROCEDURE:  The patient was identified in the holding room and transported to the operating room and placed in the supine position under the operating microscope.  The right eye was identified as the operative eye and it was prepped and draped in the usual sterile ophthalmic fashion.   A 1.0 millimeter clear-corneal paracentesis was made at the 10:30 position. 0.5 ml of preservative-free 1% lidocaine with epinephrine was injected into the anterior chamber.  The anterior chamber was filled with Healon 5 viscoelastic.  A 2.4 millimeter keratome was used to make a near-clear corneal incision at the 8:00 position.  A curvilinear capsulorrhexis was made with a cystotome and capsulorrhexis forceps.  Balanced salt solution was used to hydrodissect and hydrodelineate the nucleus.   Phacoemulsification was then used in stop and chop fashion to remove the lens nucleus and epinucleus.  The remaining cortex was then removed using the irrigation and aspiration handpiece. Healon was then placed into the capsular bag to distend it for lens placement.  A lens was then injected into the capsular  bag.  The remaining viscoelastic was aspirated.   Wounds were hydrated with balanced salt solution.  The anterior chamber was inflated to a physiologic pressure with balanced salt solution.   Intracameral vigamox 0.1 mL undiluted was injected into the eye and a drop placed onto the ocular surface.  No wound leaks were noted.  The patient was taken to the recovery room in stable condition without complications of anesthesia or surgery  Benay Pillow 03/03/2017, 9:14 AM

## 2017-03-03 NOTE — Discharge Instructions (Signed)
Eye Surgery Discharge Instructions  Expect mild scratchy sensation or mild soreness. DO NOT RUB YOUR EYE!  The day of surgery:  Minimal physical activity, but bed rest is not required  No reading, computer work, or close hand work  No bending, lifting, or straining.  May watch TV  For 24 hours:  No driving, legal decisions, or alcoholic beverages  Safety precautions  Eat anything you prefer: It is better to start with liquids, then soup then solid foods.  _____ Eye patch should be worn until postoperative exam tomorrow.  ____ Solar shield eyeglasses should be worn for comfort in the sunlight/patch while sleeping  Resume all regular medications including aspirin or Coumadin if these were discontinued prior to surgery. You may shower, bathe, shave, or wash your hair. Tylenol may be taken for mild discomfort.  Call your doctor if you experience significant pain, nausea, or vomiting, fever > 101 or other signs of infection. [phone] or [phone] Specific instructions:  Follow-up Information    Eulogio Bear, MD Follow up in 1 day(s).   Specialty:  Ophthalmology Why:  Friday, March 04, 2017 Contact information: [address] [place] [address] [phone]

## 2017-03-03 NOTE — Transfer of Care (Signed)
Immediate Anesthesia Transfer of Care Note  Patient: Julie Khan  Procedure(s) Performed: CATARACT EXTRACTION PHACO AND INTRAOCULAR LENS PLACEMENT (IOC) (Right Eye)  Patient Location: PACU  Anesthesia Type:MAC  Level of Consciousness: awake  Airway & Oxygen Therapy: Patient Spontanous Breathing  Post-op Assessment: Report given to RN  Post vital signs: Reviewed and stable  Last Vitals:  Vitals:   03/03/17 0715  BP: (!) 156/85  Pulse: 70  Resp: 18  Temp: 36.6 C  SpO2: 97%    Last Pain:  Vitals:   03/03/17 0715  TempSrc: Oral         Complications: No apparent anesthesia complications

## 2017-03-03 NOTE — H&P (Signed)
The History and Physical notes are on paper, have been signed, and are to be scanned.   I have examined the patient and there are no changes to the H&P.   Benay Pillow 03/03/2017 8:39 AM

## 2017-03-03 NOTE — Anesthesia Procedure Notes (Signed)
Procedure Name: MAC Date/Time: 03/03/2017 8:45 AM Performed by: Allean Found, CRNA Pre-anesthesia Checklist: Patient identified, Emergency Drugs available, Suction available, Patient being monitored and Timeout performed Patient Re-evaluated:Patient Re-evaluated prior to induction Oxygen Delivery Method: Nasal cannula Placement Confirmation: positive ETCO2

## 2017-03-03 NOTE — Anesthesia Post-op Follow-up Note (Signed)
Anesthesia QCDR form completed.        

## 2017-03-03 NOTE — Anesthesia Preprocedure Evaluation (Signed)
Anesthesia Evaluation  Patient identified by MRN, date of birth, ID band Patient awake    Reviewed: Allergy & Precautions, H&P , NPO status , Patient's Chart, lab work & pertinent test results, reviewed documented beta blocker date and time   Airway Mallampati: II  TM Distance: >3 FB Neck ROM: full    Dental no notable dental hx. (+) Teeth Intact   Pulmonary neg pulmonary ROS, former smoker,    Pulmonary exam normal breath sounds clear to auscultation       Cardiovascular Exercise Tolerance: Poor hypertension, On Medications + Peripheral Vascular Disease  negative cardio ROS   Rhythm:regular Rate:Normal     Neuro/Psych CVA negative neurological ROS  negative psych ROS   GI/Hepatic negative GI ROS, Neg liver ROS, GERD  ,  Endo/Other  negative endocrine ROSdiabetes  Renal/GU Renal disease     Musculoskeletal   Abdominal   Peds  Hematology negative hematology ROS (+)   Anesthesia Other Findings   Reproductive/Obstetrics negative OB ROS                             Anesthesia Physical Anesthesia Plan  ASA: III  Anesthesia Plan: MAC   Post-op Pain Management:    Induction:   PONV Risk Score and Plan: 2  Airway Management Planned:   Additional Equipment:   Intra-op Plan:   Post-operative Plan:   Informed Consent: I have reviewed the patients History and Physical, chart, labs and discussed the procedure including the risks, benefits and alternatives for the proposed anesthesia with the patient or authorized representative who has indicated his/her understanding and acceptance.     Plan Discussed with: CRNA  Anesthesia Plan Comments:         Anesthesia Quick Evaluation

## 2017-03-03 NOTE — Anesthesia Postprocedure Evaluation (Signed)
Anesthesia Post Note  Patient: Julie Khan  Procedure(s) Performed: CATARACT EXTRACTION PHACO AND INTRAOCULAR LENS PLACEMENT ([place]) (Right Eye)  Patient location during evaluation: Short Stay Anesthesia Type: MAC Level of consciousness: awake and awake and alert Pain management: pain level controlled Vital Signs Assessment: post-procedure vital signs reviewed and stable Respiratory status: spontaneous breathing Cardiovascular status: blood pressure returned to baseline Postop Assessment: no headache Anesthetic complications: no     Last Vitals:  Vitals:   03/03/17 0715 03/03/17 0916  BP: (!) 156/85 126/74  Pulse: 70 60  Resp: 18 16  Temp: 36.6 C (!) 36.3 C  SpO2: 97% 98%    Last Pain:  Vitals:   03/03/17 0916  TempSrc: Temporal                 Buckner [place]

## 2017-04-14 ENCOUNTER — Inpatient Hospital Stay: Payer: Medicare Other | Attending: Oncology

## 2017-04-14 DIAGNOSIS — Z803 Family history of malignant neoplasm of breast: Secondary | ICD-10-CM | POA: Insufficient documentation

## 2017-04-14 DIAGNOSIS — I129 Hypertensive chronic kidney disease with stage 1 through stage 4 chronic kidney disease, or unspecified chronic kidney disease: Secondary | ICD-10-CM | POA: Diagnosis not present

## 2017-04-14 DIAGNOSIS — R42 Dizziness and giddiness: Secondary | ICD-10-CM | POA: Insufficient documentation

## 2017-04-14 DIAGNOSIS — Z8673 Personal history of transient ischemic attack (TIA), and cerebral infarction without residual deficits: Secondary | ICD-10-CM | POA: Diagnosis not present

## 2017-04-14 DIAGNOSIS — Z87891 Personal history of nicotine dependence: Secondary | ICD-10-CM | POA: Insufficient documentation

## 2017-04-14 DIAGNOSIS — Z8551 Personal history of malignant neoplasm of bladder: Secondary | ICD-10-CM | POA: Insufficient documentation

## 2017-04-14 DIAGNOSIS — N189 Chronic kidney disease, unspecified: Secondary | ICD-10-CM | POA: Diagnosis not present

## 2017-04-14 DIAGNOSIS — K219 Gastro-esophageal reflux disease without esophagitis: Secondary | ICD-10-CM | POA: Insufficient documentation

## 2017-04-14 DIAGNOSIS — C9000 Multiple myeloma not having achieved remission: Secondary | ICD-10-CM | POA: Diagnosis not present

## 2017-04-14 DIAGNOSIS — Z79899 Other long term (current) drug therapy: Secondary | ICD-10-CM | POA: Diagnosis not present

## 2017-04-14 DIAGNOSIS — Z809 Family history of malignant neoplasm, unspecified: Secondary | ICD-10-CM | POA: Diagnosis not present

## 2017-04-14 LAB — CBC WITH DIFFERENTIAL/PLATELET
Basophils Absolute: 0 10*3/uL (ref 0–0.1)
Basophils Relative: 1 %
EOS ABS: 0 10*3/uL (ref 0–0.7)
EOS PCT: 1 %
HCT: 37.1 % (ref 35.0–47.0)
Hemoglobin: 12.5 g/dL (ref 12.0–16.0)
LYMPHS ABS: 1.5 10*3/uL (ref 1.0–3.6)
LYMPHS PCT: 38 %
MCH: 31 pg (ref 26.0–34.0)
MCHC: 33.6 g/dL (ref 32.0–36.0)
MCV: 92.2 fL (ref 80.0–100.0)
MONO ABS: 0.3 10*3/uL (ref 0.2–0.9)
Monocytes Relative: 8 %
Neutro Abs: 2.1 10*3/uL (ref 1.4–6.5)
Neutrophils Relative %: 52 %
PLATELETS: 196 10*3/uL (ref 150–440)
RBC: 4.02 MIL/uL (ref 3.80–5.20)
RDW: 13.1 % (ref 11.5–14.5)
WBC: 4.1 10*3/uL (ref 3.6–11.0)

## 2017-04-14 LAB — COMPREHENSIVE METABOLIC PANEL
ALT: 23 U/L (ref 14–54)
AST: 41 U/L (ref 15–41)
Albumin: 3.8 g/dL (ref 3.5–5.0)
Alkaline Phosphatase: 95 U/L (ref 38–126)
Anion gap: 7 (ref 5–15)
BILIRUBIN TOTAL: 0.7 mg/dL (ref 0.3–1.2)
BUN: 18 mg/dL (ref 6–20)
CHLORIDE: 99 mmol/L — AB (ref 101–111)
CO2: 27 mmol/L (ref 22–32)
CREATININE: 1.25 mg/dL — AB (ref 0.44–1.00)
Calcium: 9.2 mg/dL (ref 8.9–10.3)
GFR calc Af Amer: 46 mL/min — ABNORMAL LOW (ref >60)
GFR, EST NON AFRICAN AMERICAN: 40 mL/min — AB (ref >60)
Glucose, Bld: 87 mg/dL (ref 65–99)
POTASSIUM: 4.3 mmol/L (ref 3.5–5.1)
Sodium: 133 mmol/L — ABNORMAL LOW (ref 135–145)
Total Protein: 8.8 g/dL — ABNORMAL HIGH (ref 6.5–8.1)

## 2017-04-15 LAB — PROTEIN ELECTROPHORESIS, SERUM
A/G RATIO SPE: 0.8 (ref 0.7–1.7)
ALBUMIN ELP: 3.7 g/dL (ref 2.9–4.4)
Alpha-1-Globulin: 0.2 g/dL (ref 0.0–0.4)
Alpha-2-Globulin: 0.8 g/dL (ref 0.4–1.0)
Beta Globulin: 1.1 g/dL (ref 0.7–1.3)
GLOBULIN, TOTAL: 4.9 g/dL — AB (ref 2.2–3.9)
Gamma Globulin: 2.8 g/dL — ABNORMAL HIGH (ref 0.4–1.8)
M-Spike, %: 2.5 g/dL — ABNORMAL HIGH
Total Protein ELP: 8.6 g/dL — ABNORMAL HIGH (ref 6.0–8.5)

## 2017-04-15 LAB — KAPPA/LAMBDA LIGHT CHAINS
KAPPA FREE LGHT CHN: 138.7 mg/L — AB (ref 3.3–19.4)
Kappa, lambda light chain ratio: 8.89 — ABNORMAL HIGH (ref 0.26–1.65)
LAMDA FREE LIGHT CHAINS: 15.6 mg/L (ref 5.7–26.3)

## 2017-04-15 LAB — IGG, IGA, IGM
IGA: 80 mg/dL (ref 64–422)
IgG (Immunoglobin G), Serum: 2834 mg/dL — ABNORMAL HIGH (ref 700–1600)
IgM (Immunoglobulin M), Srm: 122 mg/dL (ref 26–217)

## 2017-04-17 NOTE — Progress Notes (Signed)
[facility]  [phone] [phone]  ID: Eustace Pen OB: 1934/09/24  MR#: [id number]  QIH#:[id number]  Patient Care Team: Jodi Marble, MD as PCP - General (Internal Medicine)  CHIEF COMPLAINT: Smoldering myeloma  INTERVAL HISTORY: Patient returns to clinic today for repeat laboratory work and further evaluation.  She continues to feel well and is asymptomatic. She has no neurologic complaints. She denies any recent fevers or illnesses. She has a good appetite and denies weight loss. She has no chest pain or shortness of breath. She denies any nausea, vomiting, constipation, or diarrhea. She has no urinary complaints. Patient offers no specific complaints today.  REVIEW OF SYSTEMS:   Review of Systems  Constitutional: Negative.  Negative for fever, malaise/fatigue and weight loss.  Respiratory: Negative.  Negative for cough and shortness of breath.   Cardiovascular: Negative.  Negative for chest pain and leg swelling.  Gastrointestinal: Negative.  Negative for abdominal pain, blood in stool and melena.  Genitourinary: Negative.   Musculoskeletal: Negative.   Skin: Negative.  Negative for rash.  Neurological: Negative.  Negative for sensory change and weakness.  Psychiatric/Behavioral: Negative.  The patient is not nervous/anxious.     As per HPI. Otherwise, a complete review of systems is negative.  PAST MEDICAL HISTORY: Past Medical History:  Diagnosis Date  . Arthritis   . Bladder cancer ([place])   . Cancer ([place])    multiple myeloma  . Chronic kidney disease   . Dizziness   . GERD (gastroesophageal reflux disease)   . Hypertension   . Stroke ([place])    tia x 2    PAST SURGICAL HISTORY: Past Surgical History:  Procedure Laterality Date  . bladder tumor removed    . BREAST EXCISIONAL BIOPSY Bilateral 1970   Benign  . BREAST SURGERY     bx  . CATARACT EXTRACTION W/PHACO Left 12/16/2016   Procedure: CATARACT EXTRACTION PHACO  AND INTRAOCULAR LENS PLACEMENT ([facility]);  Surgeon: Eulogio Bear, MD;  Location: [facility] ORS;  Service: Ophthalmology;  Laterality: Left;  [place] 00:38.0 AP% 14.6 CDE 5.54 Fluid pack lot # [id number]  . CATARACT EXTRACTION W/PHACO Right 03/03/2017   Procedure: CATARACT EXTRACTION PHACO AND INTRAOCULAR LENS PLACEMENT ([place]);  Surgeon: Eulogio Bear, MD;  Location: [facility] ORS;  Service: Ophthalmology;  Laterality: Right;  Lot # [id number] H [place]: 00:29.8 AP%: 10.1 CDE: 3.01  . COLONOSCOPY WITH PROPOFOL N/A 07/25/2015   Procedure: COLONOSCOPY WITH PROPOFOL;  Surgeon: Josefine Class, MD;  Location: [facility] ENDOSCOPY;  Service: Endoscopy;  Laterality: N/A;  . ESOPHAGOGASTRODUODENOSCOPY (EGD) WITH PROPOFOL N/A 07/25/2015   Procedure: ESOPHAGOGASTRODUODENOSCOPY (EGD) WITH PROPOFOL;  Surgeon: Josefine Class, MD;  Location: [facility] ENDOSCOPY;  Service: Endoscopy;  Laterality: N/A;  . HERNIA REPAIR      FAMILY HISTORY: Family History  Problem Relation Age of Onset  . Cancer Mother   . Cancer Sister   . Breast cancer Sister 61  . Cancer Brother   . Bladder Cancer Neg Hx   . Kidney cancer Neg Hx     ADVANCED DIRECTIVES (Y/N):  N  HEALTH MAINTENANCE: Social History   Tobacco Use  . Smoking status: Former Research scientist (life sciences)  . Smokeless tobacco: Never Used  Substance Use Topics  . Alcohol use: No  . Drug use: No     Colonoscopy:  PAP:  Bone density:  Lipid panel:  Allergies  Allergen Reactions  . Iodine     Seizure  Betadine ok  . Penicillins Other (See Comments)  Has patient had a PCN reaction causing immediate rash, facial/tongue/throat swelling, SOB or lightheadedness with hypotension: Unknown Has patient had a PCN reaction causing severe rash involving mucus membranes or skin necrosis: Unknown Has patient had a PCN reaction that required hospitalization: Unknown Has patient had a PCN reaction occurring within the last 10 years: Unknown If all of the above answers are "NO", then may proceed  with Cephalosporin use.     Current Outpatient Medications  Medication Sig Dispense Refill  . losartan (COZAAR) 25 MG tablet Take 25 mg by mouth daily.    . metoprolol succinate (TOPROL-XL) 25 MG 24 hr tablet Take 25 mg by mouth daily.    Marland Kitchen oxazepam (SERAX) 10 MG capsule Take 10 mg by mouth at bedtime as needed for sleep or anxiety.    . pantoprazole (PROTONIX) 40 MG tablet Take 40 mg by mouth daily.    . simvastatin (ZOCOR) 20 MG tablet Take 25m by mouth once daily  1   No current facility-administered medications for this visit.     OBJECTIVE: Vitals:   04/21/17 1407  BP: 138/88  Pulse: 70  Resp: 18  Temp: (!) 97.5 F (36.4 C)     Body mass index is 29.51 kg/m.    ECOG FS:0 - Asymptomatic  General: Well-developed, well-nourished, no acute distress. Eyes: Pink conjunctiva, anicteric sclera. Lungs: Clear to auscultation bilaterally. Heart: Regular rate and rhythm. No rubs, murmurs, or gallops. Abdomen: Soft, nontender, nondistended. No organomegaly noted, normoactive bowel sounds. Musculoskeletal: No edema, cyanosis, or clubbing. Neuro: Alert, answering all questions appropriately. Cranial nerves grossly intact. Skin: No rashes or petechiae noted. Psych: Normal affect.   LAB RESULTS:  Lab Results  Component Value Date   NA 133 (L) 04/14/2017   K 4.3 04/14/2017   CL 99 (L) 04/14/2017   CO2 27 04/14/2017   GLUCOSE 87 04/14/2017   BUN 18 04/14/2017   CREATININE 1.25 (H) 04/14/2017   CALCIUM 9.2 04/14/2017   PROT 8.8 (H) 04/14/2017   ALBUMIN 3.8 04/14/2017   AST 41 04/14/2017   ALT 23 04/14/2017   ALKPHOS 95 04/14/2017   BILITOT 0.7 04/14/2017   GFRNONAA 40 (L) 04/14/2017   GFRAA 46 (L) 04/14/2017    Lab Results  Component Value Date   WBC 4.1 04/14/2017   NEUTROABS 2.1 04/14/2017   HGB 12.5 04/14/2017   HCT 37.1 04/14/2017   MCV 92.2 04/14/2017   PLT 196 04/14/2017   Lab Results  Component Value Date   TOTALPROTELP 8.5 04/14/2017   TOTALPROTELP 8.6  (H) 04/14/2017   ALBUMINELP 3.7 04/14/2017   A1GS 0.2 04/14/2017   A2GS 0.8 04/14/2017   BETS 1.1 04/14/2017   GAMS 2.8 (H) 04/14/2017   MSPIKE 2.5 (H) 04/14/2017   SPEI Comment 04/14/2017     STUDIES: No results found.  ASSESSMENT: Smoldering myeloma  PLAN:   1. Smoldering myeloma: Bone marrow biopsy on December 23, 2011 revealed 15% plasma cells with cytogenetics having monosomy 13 in approximately 7%. Skeletal survey on February 20, 2016 was negative for any lesions. Patient's M spike continues to be stable ranging from approximately 2.0-2.5. Her IgG levels are significantly elevated at approximately 3000 which appears to be unchanged.  Kappa free light chains also remain elevated, but essentially stable. She has mild renal insufficiency, but otherwise no evidence of endorgan damage. No intervention is needed at this time. Patient does not require repeat bone marrow biopsy. Return to clinic in 4 months with repeat laboratory work and further evaluation.  If everything remains stable at that time, patient likely can be monitored every 6 months. 2. Renal insufficiency: Unclear patient's baseline, monitor. 3. Hypertension: Patient's blood pressure is within normal limits today, monitor. Continue treatment per primary care.  Approximate 30 minutes was spent in discussion of which greater than 50% was consultation.  Patient expressed understanding and was in agreement with this plan. She also understands that She can call clinic at any time with any questions, concerns, or complaints.    Lloyd Huger, MD   04/24/2017 12:50 PM

## 2017-04-18 LAB — MULTIPLE MYELOMA PANEL, SERUM
ALBUMIN SERPL ELPH-MCNC: 3.5 g/dL (ref 2.9–4.4)
Albumin/Glob SerPl: 0.8 (ref 0.7–1.7)
Alpha 1: 0.2 g/dL (ref 0.0–0.4)
Alpha2 Glob SerPl Elph-Mcnc: 0.8 g/dL (ref 0.4–1.0)
B-GLOBULIN SERPL ELPH-MCNC: 1.1 g/dL (ref 0.7–1.3)
GAMMA GLOB SERPL ELPH-MCNC: 2.9 g/dL — AB (ref 0.4–1.8)
GLOBULIN, TOTAL: 5 g/dL — AB (ref 2.2–3.9)
IgA: 81 mg/dL (ref 64–422)
IgG (Immunoglobin G), Serum: 2954 mg/dL — ABNORMAL HIGH (ref 700–1600)
IgM (Immunoglobulin M), Srm: 125 mg/dL (ref 26–217)
M PROTEIN SERPL ELPH-MCNC: 2.1 g/dL — AB
Total Protein ELP: 8.5 g/dL (ref 6.0–8.5)

## 2017-04-20 NOTE — Progress Notes (Signed)
Labs appear to be stable for her. You are seeing her tomorrow. Just wanted to shoot these to you guys.  JB

## 2017-04-21 ENCOUNTER — Inpatient Hospital Stay (HOSPITAL_BASED_OUTPATIENT_CLINIC_OR_DEPARTMENT_OTHER): Payer: Medicare Other | Admitting: Oncology

## 2017-04-21 VITALS — BP 138/88 | HR 70 | Temp 97.5°F | Resp 18 | Wt 151.1 lb

## 2017-04-21 DIAGNOSIS — I129 Hypertensive chronic kidney disease with stage 1 through stage 4 chronic kidney disease, or unspecified chronic kidney disease: Secondary | ICD-10-CM | POA: Diagnosis not present

## 2017-04-21 DIAGNOSIS — Z87891 Personal history of nicotine dependence: Secondary | ICD-10-CM

## 2017-04-21 DIAGNOSIS — N189 Chronic kidney disease, unspecified: Secondary | ICD-10-CM

## 2017-04-21 DIAGNOSIS — C9000 Multiple myeloma not having achieved remission: Secondary | ICD-10-CM | POA: Diagnosis not present

## 2017-04-21 DIAGNOSIS — R42 Dizziness and giddiness: Secondary | ICD-10-CM

## 2017-04-21 DIAGNOSIS — Z79899 Other long term (current) drug therapy: Secondary | ICD-10-CM

## 2017-04-21 DIAGNOSIS — Z8551 Personal history of malignant neoplasm of bladder: Secondary | ICD-10-CM | POA: Diagnosis not present

## 2017-04-21 DIAGNOSIS — K219 Gastro-esophageal reflux disease without esophagitis: Secondary | ICD-10-CM | POA: Diagnosis not present

## 2017-04-21 DIAGNOSIS — Z8673 Personal history of transient ischemic attack (TIA), and cerebral infarction without residual deficits: Secondary | ICD-10-CM | POA: Diagnosis not present

## 2017-04-21 DIAGNOSIS — Z809 Family history of malignant neoplasm, unspecified: Secondary | ICD-10-CM | POA: Diagnosis not present

## 2017-04-21 DIAGNOSIS — Z803 Family history of malignant neoplasm of breast: Secondary | ICD-10-CM | POA: Diagnosis not present

## 2017-06-28 ENCOUNTER — Encounter: Payer: Self-pay | Admitting: *Deleted

## 2017-06-29 ENCOUNTER — Ambulatory Visit: Payer: Medicare Other | Admitting: Anesthesiology

## 2017-06-29 ENCOUNTER — Encounter
Admission: RE | Disposition: A | Discharge status: Home / Self Care | Payer: Self-pay | Source: Ambulatory Visit | Attending: Internal Medicine

## 2017-06-29 ENCOUNTER — Encounter: Payer: Self-pay | Admitting: Emergency Medicine

## 2017-06-29 ENCOUNTER — Ambulatory Visit
Admission: RE | Admit: 2017-06-29 | Discharge: 2017-06-29 | Disposition: A | Discharge status: Home / Self Care | Payer: Medicare Other | Source: Ambulatory Visit | Attending: Internal Medicine | Admitting: Internal Medicine

## 2017-06-29 DIAGNOSIS — Z87891 Personal history of nicotine dependence: Secondary | ICD-10-CM | POA: Insufficient documentation

## 2017-06-29 DIAGNOSIS — K219 Gastro-esophageal reflux disease without esophagitis: Secondary | ICD-10-CM | POA: Diagnosis not present

## 2017-06-29 DIAGNOSIS — Z8579 Personal history of other malignant neoplasms of lymphoid, hematopoietic and related tissues: Secondary | ICD-10-CM | POA: Diagnosis not present

## 2017-06-29 DIAGNOSIS — Z8551 Personal history of malignant neoplasm of bladder: Secondary | ICD-10-CM | POA: Insufficient documentation

## 2017-06-29 DIAGNOSIS — K449 Diaphragmatic hernia without obstruction or gangrene: Secondary | ICD-10-CM | POA: Diagnosis not present

## 2017-06-29 DIAGNOSIS — Z79899 Other long term (current) drug therapy: Secondary | ICD-10-CM | POA: Insufficient documentation

## 2017-06-29 DIAGNOSIS — K921 Melena: Secondary | ICD-10-CM | POA: Insufficient documentation

## 2017-06-29 DIAGNOSIS — Z8673 Personal history of transient ischemic attack (TIA), and cerebral infarction without residual deficits: Secondary | ICD-10-CM | POA: Insufficient documentation

## 2017-06-29 DIAGNOSIS — I739 Peripheral vascular disease, unspecified: Secondary | ICD-10-CM | POA: Insufficient documentation

## 2017-06-29 DIAGNOSIS — I129 Hypertensive chronic kidney disease with stage 1 through stage 4 chronic kidney disease, or unspecified chronic kidney disease: Secondary | ICD-10-CM | POA: Diagnosis not present

## 2017-06-29 DIAGNOSIS — K5730 Diverticulosis of large intestine without perforation or abscess without bleeding: Secondary | ICD-10-CM | POA: Diagnosis not present

## 2017-06-29 DIAGNOSIS — K640 First degree hemorrhoids: Secondary | ICD-10-CM | POA: Insufficient documentation

## 2017-06-29 DIAGNOSIS — N189 Chronic kidney disease, unspecified: Secondary | ICD-10-CM | POA: Insufficient documentation

## 2017-06-29 DIAGNOSIS — Z86010 Personal history of colonic polyps: Secondary | ICD-10-CM | POA: Diagnosis not present

## 2017-06-29 HISTORY — DX: Benign neoplasm of colon, unspecified: D12.6

## 2017-06-29 HISTORY — PX: ESOPHAGOGASTRODUODENOSCOPY (EGD) WITH PROPOFOL: SHX5813

## 2017-06-29 HISTORY — DX: Multiple myeloma not having achieved remission: C90.00

## 2017-06-29 HISTORY — PX: COLONOSCOPY WITH PROPOFOL: SHX5780

## 2017-06-29 SURGERY — ESOPHAGOGASTRODUODENOSCOPY (EGD) WITH PROPOFOL
Anesthesia: General

## 2017-06-29 MED ORDER — PROPOFOL 500 MG/50ML IV EMUL
INTRAVENOUS | Status: DC | PRN
  Administered 2017-06-29: 200 ug/kg/min via INTRAVENOUS

## 2017-06-29 MED ORDER — SODIUM CHLORIDE 0.9 % IV SOLN
INTRAVENOUS | Status: DC
  Administered 2017-06-29: 1000 mL via INTRAVENOUS

## 2017-06-29 MED ORDER — LIDOCAINE HCL (CARDIAC) 20 MG/ML IV SOLN
INTRAVENOUS | Status: DC | PRN
  Administered 2017-06-29: 100 mg via INTRAVENOUS

## 2017-06-29 MED ORDER — PROPOFOL 500 MG/50ML IV EMUL
INTRAVENOUS | Status: AC
  Filled 2017-06-29: qty 50

## 2017-06-29 MED ORDER — PROPOFOL 10 MG/ML IV BOLUS
INTRAVENOUS | Status: DC | PRN
  Administered 2017-06-29: 70 mg via INTRAVENOUS

## 2017-06-29 MED ORDER — LIDOCAINE HCL (PF) 2 % IJ SOLN
INTRAMUSCULAR | Status: AC
  Filled 2017-06-29: qty 10

## 2017-06-29 NOTE — Anesthesia Postprocedure Evaluation (Signed)
Anesthesia Post Note  Patient: Julie Khan  Procedure(s) Performed: ESOPHAGOGASTRODUODENOSCOPY (EGD) WITH PROPOFOL (N/A ) COLONOSCOPY WITH PROPOFOL (N/A )  Patient location during evaluation: Endoscopy Anesthesia Type: General Level of consciousness: awake and alert Pain management: pain level controlled Vital Signs Assessment: post-procedure vital signs reviewed and stable Respiratory status: spontaneous breathing, nonlabored ventilation, respiratory function stable and patient connected to nasal cannula oxygen Cardiovascular status: blood pressure returned to baseline and stable Postop Assessment: no apparent nausea or vomiting Anesthetic complications: no     Last Vitals:  Vitals:   06/29/17 1130 06/29/17 1150  BP: 118/61 120/69  Pulse: 83 76  Resp: 14 17  Temp:    SpO2: 98% 98%    Last Pain:  Vitals:   06/29/17 1150  TempSrc:   PainSc: 6                  Julie Khan K Arabela Basaldua

## 2017-06-29 NOTE — Op Note (Signed)
[facility] Gastroenterology Patient Name: Julie Khan Procedure Date: 06/29/2017 10:51 AM MRN: [id number] Account #: [id number] Date of Birth: [birth year] Admit Type: Outpatient Age: 83 Room: [facility] ENDO ROOM 3 Gender: Female Note Status: Finalized Procedure:            Upper GI endoscopy Indications:          Recent gastrointestinal bleeding Providers:            Benay Pike. [place] MD, MD Medicines:            Propofol per Anesthesia Complications:        No immediate complications. Procedure:            Pre-Anesthesia Assessment:                       - The risks and benefits of the procedure and the                        sedation options and risks were discussed with the                        patient. All questions were answered and informed                        consent was obtained.                       - Patient identification and proposed procedure were                        verified prior to the procedure by the nurse. The                        procedure was verified in the procedure room.                       - ASA Grade Assessment: III - A patient with severe                        systemic disease.                       - After reviewing the risks and benefits, the patient                        was deemed in satisfactory condition to undergo the                        procedure.                       After obtaining informed consent, the endoscope was                        passed under direct vision. Throughout the procedure,                        the patient's blood pressure, pulse, and oxygen                        saturations were monitored continuously. The Endoscope  was introduced through the mouth, and advanced to the                        third part of duodenum. The upper GI endoscopy was                        accomplished without difficulty. The patient tolerated                        the procedure  well. Findings:      The examined esophagus was normal.      A small hiatal hernia was present.      The exam was otherwise without abnormality.      The examined duodenum was normal. Impression:           - Normal esophagus.                       - Small hiatal hernia.                       - The examination was otherwise normal.                       - Normal examined duodenum.                       - No specimens collected. Recommendation:       - Await pathology results.                       - Proceed with colonoscopy Procedure Code(s):    --- Professional ---                       [phone], Esophagogastroduodenoscopy, flexible, transoral;                        diagnostic, including collection of specimen(s) by                        brushing or washing, when performed (separate procedure) Diagnosis Code(s):    --- Professional ---                       K44.9, Diaphragmatic hernia without obstruction or                        gangrene                       K92.2, Gastrointestinal hemorrhage, unspecified CPT copyright 2016 American Medical Association. All rights reserved. The codes documented in this report are preliminary and upon coder review may  be revised to meet current compliance requirements. Efrain Sella MD, MD 06/29/2017 10:59:01 AM This report has been signed electronically. Number of Addenda: 0 Note Initiated On: 06/29/2017 10:51 AM      [facility] ([place])

## 2017-06-29 NOTE — Transfer of Care (Signed)
Immediate Anesthesia Transfer of Care Note  Patient: Julie Khan  Procedure(s) Performed: ESOPHAGOGASTRODUODENOSCOPY (EGD) WITH PROPOFOL (N/A ) COLONOSCOPY WITH PROPOFOL (N/A )  Patient Location: PACU and Endoscopy Unit  Anesthesia Type:General  Level of Consciousness: awake  Airway & Oxygen Therapy: Patient Spontanous Breathing  Post-op Assessment: Report given to RN  Post vital signs: stable  Last Vitals:  Vitals Value Taken Time  BP    Temp    Pulse    Resp    SpO2      Last Pain:  Vitals:   06/29/17 0957  TempSrc: Tympanic         Complications: No apparent anesthesia complications

## 2017-06-29 NOTE — Anesthesia Preprocedure Evaluation (Signed)
Anesthesia Evaluation  Patient identified by MRN, date of birth, ID band Patient awake    Reviewed: Allergy & Precautions, H&P , NPO status , Patient's Chart, lab work & pertinent test results  History of Anesthesia Complications Negative for: history of anesthetic complications  Airway Mallampati: III  TM Distance: <3 FB Neck ROM: limited    Dental  (+) Chipped, Poor Dentition, Missing   Pulmonary neg shortness of breath, former smoker,           Cardiovascular Exercise Tolerance: Good hypertension, (-) angina+ Peripheral Vascular Disease  (-) Past MI and (-) DOE negative cardio ROS       Neuro/Psych CVA negative psych ROS   GI/Hepatic Neg liver ROS, GERD  Medicated and Controlled,  Endo/Other  negative endocrine ROS  Renal/GU Renal disease  negative genitourinary   Musculoskeletal  (+) Arthritis ,   Abdominal   Peds  Hematology negative hematology ROS (+)   Anesthesia Other Findings Past Medical History: No date: Arthritis No date: Bladder cancer ([facility]) No date: Bladder cancer ([facility]) No date: Cancer ([place])     Comment:  multiple myeloma No date: Chronic kidney disease No date: Chronic kidney disease No date: Dizziness No date: GERD (gastroesophageal reflux disease) No date: Hypertension No date: Multiple myeloma ([facility]) No date: Stroke [facility])     Comment:  tia x 2 No date: Tubular adenoma of colon  Past Surgical History: No date: bladder tumor removed 1970: BREAST EXCISIONAL BIOPSY; Bilateral     Comment:  Benign No date: BREAST SURGERY     Comment:  bx 12/16/2016: CATARACT EXTRACTION W/PHACO; Left     Comment:  Procedure: CATARACT EXTRACTION PHACO AND INTRAOCULAR               LENS PLACEMENT ([facility]);  Surgeon: Eulogio Bear, MD;                Location: [facility] ORS;  Service: Ophthalmology;  Laterality:              Left;  [place] 00:38.0 AP% 14.6 CDE 5.54 Fluid pack lot #                [id number] 03/03/2017: CATARACT EXTRACTION W/PHACO; Right     Comment:  Procedure: CATARACT EXTRACTION PHACO AND INTRAOCULAR               LENS PLACEMENT ([facility]);  Surgeon: Eulogio Bear, MD;                Location: [facility] ORS;  Service: Ophthalmology;  Laterality:              Right;  Lot # [id number] H [place]: 00:29.8 AP%: 10.1 CDE: 3.01 07/25/2015: COLONOSCOPY WITH PROPOFOL; N/A     Comment:  Procedure: COLONOSCOPY WITH PROPOFOL;  Surgeon: Josefine Class, MD;  Location: [facility] ENDOSCOPY;  Service:               Endoscopy;  Laterality: N/A; 07/25/2015: ESOPHAGOGASTRODUODENOSCOPY (EGD) WITH PROPOFOL; N/A     Comment:  Procedure: ESOPHAGOGASTRODUODENOSCOPY (EGD) WITH               PROPOFOL;  Surgeon: Josefine Class, MD;  Location:               [facility] ENDOSCOPY;  Service: Endoscopy;  Laterality: N/A; No date: HERNIA REPAIR  BMI    Body Mass Index:  28.32 kg/m  Reproductive/Obstetrics negative OB ROS                             Anesthesia Physical Anesthesia Plan  ASA: III  Anesthesia Plan: General   Post-op Pain Management:    Induction: Intravenous  PONV Risk Score and Plan: Propofol infusion and TIVA  Airway Management Planned: Natural Airway and Nasal Cannula  Additional Equipment:   Intra-op Plan:   Post-operative Plan:   Informed Consent: I have reviewed the patients History and Physical, chart, labs and discussed the procedure including the risks, benefits and alternatives for the proposed anesthesia with the patient or authorized representative who has indicated his/her understanding and acceptance.   Dental Advisory Given  Plan Discussed with: Anesthesiologist, CRNA and Surgeon  Anesthesia Plan Comments: (Patients declines offer of interpreter   Patient consented for risks of anesthesia including but not limited to:  - adverse reactions to medications - risk of intubation if required - damage to teeth, lips or  other oral mucosa - sore throat or hoarseness - Damage to heart, brain, lungs or loss of life  Patient voiced understanding.)        Anesthesia Quick Evaluation

## 2017-06-29 NOTE — Op Note (Signed)
[facility] Gastroenterology Patient Name: Julie Khan Procedure Date: 06/29/2017 10:52 AM MRN: [id number] Account #: [id number] Date of Birth: November 29, 1934 Admit Type: Outpatient Age: 83 Room: [facility] ENDO ROOM 3 Gender: Female Note Status: Finalized Procedure:            Colonoscopy Indications:          Hematochezia Providers:            Benay Pike. [place] MD, MD Medicines:            Propofol per Anesthesia Complications:        No immediate complications. Procedure:            Pre-Anesthesia Assessment:                       - The risks and benefits of the procedure and the                        sedation options and risks were discussed with the                        patient. All questions were answered and informed                        consent was obtained.                       - Patient identification and proposed procedure were                        verified prior to the procedure by the nurse. The                        procedure was verified in the procedure room.                       - ASA Grade Assessment: III - A patient with severe                        systemic disease.                       - After reviewing the risks and benefits, the patient                        was deemed in satisfactory condition to undergo the                        procedure.                       After obtaining informed consent, the colonoscope was                        passed under direct vision. Throughout the procedure,                        the patient's blood pressure, pulse, and oxygen                        saturations were monitored continuously. The  Colonoscope was introduced through the anus and                        advanced to the the cecum, identified by appendiceal                        orifice and ileocecal valve. The colonoscopy was                        performed without difficulty. The patient tolerated the              procedure well. Findings:      The perianal and digital rectal examinations were normal. Pertinent       negatives include normal sphincter tone and no palpable rectal lesions.      Many small-mouthed diverticula were found in the sigmoid colon.      Non-bleeding internal hemorrhoids were found during retroflexion. The       hemorrhoids were Grade I (internal hemorrhoids that do not prolapse). Impression:           - Diverticulosis in the sigmoid colon.                       - The examination was otherwise normal.                       - No specimens collected. Recommendation:       - Patient has a contact number available for                        emergencies. The signs and symptoms of potential                        delayed complications were discussed with the patient.                        Return to normal activities tomorrow. Written discharge                        instructions were provided to the patient.                       - Resume previous diet.                       - Continue present medications.                       - To visualize the small bowel, perform video capsule                        endoscopy in 2 weeks.                       - Return to nurse practitioner in 2 months.                       - The findings and recommendations were discussed with                        the patient and their family. Procedure Code(s):    --- Professional ---  45378, Colonoscopy, flexible; diagnostic, including                        collection of specimen(s) by brushing or washing, when                        performed (separate procedure) Diagnosis Code(s):    --- Professional ---                       K57.30, Diverticulosis of large intestine without                        perforation or abscess without bleeding                       K92.1, Melena (includes Hematochezia) CPT copyright 2016 American Medical Association. All rights reserved. The  codes documented in this report are preliminary and upon coder review may  be revised to meet current compliance requirements. Efrain Sella MD, MD 06/29/2017 11:20:29 AM This report has been signed electronically. Number of Addenda: 0 Note Initiated On: 06/29/2017 10:52 AM Scope Withdrawal Time: 0 hours 6 minutes 58 seconds  Total Procedure Duration: 0 hours 14 minutes 12 seconds       [facility]

## 2017-06-29 NOTE — H&P (Signed)
Outpatient short stay form Pre-procedure 06/29/2017 9:34 AM Julie Khan K. [name], M.D.  Primary Physician: Pernell Dupre, M.D.  Reason for visit:  Hematochezia, melena   History of present illness:  This 83 year old  patient has had a stable hemoglobin and otherwise had no other GI symptoms.Has a history of a diverticular bleed in the past, however.Last colonoscopy in 2017 showed perirectal ulcers and diverticulosis of the colon. Patient also aid increased symptoms of. EGD in April 2017 showed a small hiatal hernia but was otherwise normal.    No current facility-administered medications for this encounter.   Medications Prior to Admission  Medication Sig Dispense Refill Last Dose  . losartan (COZAAR) 25 MG tablet Take 25 mg by mouth daily.   Taking  . metoprolol succinate (TOPROL-XL) 25 MG 24 hr tablet Take 25 mg by mouth daily.   Taking  . oxazepam (SERAX) 10 MG capsule Take 10 mg by mouth at bedtime as needed for sleep or anxiety.   Taking  . pantoprazole (PROTONIX) 40 MG tablet Take 40 mg by mouth daily.   Taking  . simvastatin (ZOCOR) 20 MG tablet Take 55m by mouth once daily  1 Taking     Allergies  Allergen Reactions  . Iodine     Seizure  Betadine ok  . Penicillins Other (See Comments)    Has patient had a PCN reaction causing immediate rash, facial/tongue/throat swelling, SOB or lightheadedness with hypotension: Unknown Has patient had a PCN reaction causing severe rash involving mucus membranes or skin necrosis: Unknown Has patient had a PCN reaction that required hospitalization: Unknown Has patient had a PCN reaction occurring within the last 10 years: Unknown If all of the above answers are "NO", then may proceed with Cephalosporin use.      Past Medical History:  Diagnosis Date  . Arthritis   . Bladder cancer ([place]   . Bladder cancer ([place]   . Cancer ([place]    multiple myeloma  . Chronic kidney disease   . Chronic kidney disease   . Dizziness   . GERD  (gastroesophageal reflux disease)   . Hypertension   . Multiple myeloma ([facility]   . Stroke ([facility]    tia x 2  . Tubular adenoma of colon     Review of systems:   Otherwise negative.    Physical Exam  Gen: Alert, oriented. Appears stated age.  HEENT: [place]/AT. PERRLA. Lungs: CTA, no wheezes. CV: RR nl S1, S2. Abd: soft, benign, no masses. BS+ Ext: No edema. Pulses 2+    Planned procedures: Proceed with EGD and colonoscopy. The patient understands the nature of the planned procedure, indications, risks, alternatives and potential complications including but not limited to bleeding, infection, perforation, damage to internal organs and possible oversedation/side effects from anesthesia. The patient agrees and gives consent to proceed.  Please refer to procedure notes for findings, recommendations and patient disposition/instructions.    Kayleb Warshaw K. TAlice Reichert M.D. Gastroenterology 06/29/2017  9:34 AM

## 2017-06-29 NOTE — Anesthesia Post-op Follow-up Note (Signed)
Anesthesia QCDR form completed.        

## 2017-07-15 ENCOUNTER — Ambulatory Visit (INDEPENDENT_AMBULATORY_CARE_PROVIDER_SITE_OTHER): Payer: Medicare Other | Admitting: Vascular Surgery

## 2017-07-15 ENCOUNTER — Ambulatory Visit (INDEPENDENT_AMBULATORY_CARE_PROVIDER_SITE_OTHER): Payer: Medicare Other

## 2017-07-15 DIAGNOSIS — I6523 Occlusion and stenosis of bilateral carotid arteries: Secondary | ICD-10-CM

## 2017-07-22 NOTE — Addendum Note (Signed)
Addendum  created 07/22/17 0752 by Piscitello, Precious Haws, MD   Attestation recorded in [place], [place] filed

## 2017-08-18 ENCOUNTER — Inpatient Hospital Stay: Payer: Medicare Other | Attending: Oncology

## 2017-08-18 ENCOUNTER — Other Ambulatory Visit: Payer: Self-pay

## 2017-08-18 DIAGNOSIS — I129 Hypertensive chronic kidney disease with stage 1 through stage 4 chronic kidney disease, or unspecified chronic kidney disease: Secondary | ICD-10-CM | POA: Insufficient documentation

## 2017-08-18 DIAGNOSIS — Z8551 Personal history of malignant neoplasm of bladder: Secondary | ICD-10-CM | POA: Diagnosis not present

## 2017-08-18 DIAGNOSIS — C9000 Multiple myeloma not having achieved remission: Secondary | ICD-10-CM | POA: Diagnosis not present

## 2017-08-18 DIAGNOSIS — N189 Chronic kidney disease, unspecified: Secondary | ICD-10-CM | POA: Insufficient documentation

## 2017-08-18 DIAGNOSIS — Z800 Family history of malignant neoplasm of digestive organs: Secondary | ICD-10-CM | POA: Insufficient documentation

## 2017-08-18 DIAGNOSIS — Z803 Family history of malignant neoplasm of breast: Secondary | ICD-10-CM | POA: Diagnosis not present

## 2017-08-18 DIAGNOSIS — Z8673 Personal history of transient ischemic attack (TIA), and cerebral infarction without residual deficits: Secondary | ICD-10-CM | POA: Diagnosis not present

## 2017-08-18 DIAGNOSIS — K219 Gastro-esophageal reflux disease without esophagitis: Secondary | ICD-10-CM | POA: Insufficient documentation

## 2017-08-18 DIAGNOSIS — Z87891 Personal history of nicotine dependence: Secondary | ICD-10-CM | POA: Insufficient documentation

## 2017-08-18 DIAGNOSIS — Z79899 Other long term (current) drug therapy: Secondary | ICD-10-CM | POA: Insufficient documentation

## 2017-08-18 LAB — CBC WITH DIFFERENTIAL/PLATELET
Basophils Absolute: 0 10*3/uL (ref 0–0.1)
Basophils Relative: 1 %
EOS ABS: 0 10*3/uL (ref 0–0.7)
Eosinophils Relative: 1 %
HEMATOCRIT: 34.4 % — AB (ref 35.0–47.0)
HEMOGLOBIN: 11.9 g/dL — AB (ref 12.0–16.0)
LYMPHS ABS: 1.5 10*3/uL (ref 1.0–3.6)
LYMPHS PCT: 36 %
MCH: 31.5 pg (ref 26.0–34.0)
MCHC: 34.6 g/dL (ref 32.0–36.0)
MCV: 91.1 fL (ref 80.0–100.0)
Monocytes Absolute: 0.3 10*3/uL (ref 0.2–0.9)
Monocytes Relative: 7 %
NEUTROS ABS: 2.2 10*3/uL (ref 1.4–6.5)
NEUTROS PCT: 55 %
Platelets: 195 10*3/uL (ref 150–440)
RBC: 3.77 MIL/uL — AB (ref 3.80–5.20)
RDW: 12.9 % (ref 11.5–14.5)
WBC: 4.1 10*3/uL (ref 3.6–11.0)

## 2017-08-18 LAB — COMPREHENSIVE METABOLIC PANEL
ALT: 23 U/L (ref 14–54)
ANION GAP: 8 (ref 5–15)
AST: 41 U/L (ref 15–41)
Albumin: 3.5 g/dL (ref 3.5–5.0)
Alkaline Phosphatase: 100 U/L (ref 38–126)
BUN: 25 mg/dL — ABNORMAL HIGH (ref 6–20)
CHLORIDE: 103 mmol/L (ref 101–111)
CO2: 23 mmol/L (ref 22–32)
Calcium: 9.3 mg/dL (ref 8.9–10.3)
Creatinine, Ser: 1.28 mg/dL — ABNORMAL HIGH (ref 0.44–1.00)
GFR, EST AFRICAN AMERICAN: 45 mL/min — AB (ref >60)
GFR, EST NON AFRICAN AMERICAN: 38 mL/min — AB (ref >60)
Glucose, Bld: 93 mg/dL (ref 65–99)
POTASSIUM: 4.7 mmol/L (ref 3.5–5.1)
SODIUM: 134 mmol/L — AB (ref 135–145)
Total Bilirubin: 0.8 mg/dL (ref 0.3–1.2)
Total Protein: 8.3 g/dL — ABNORMAL HIGH (ref 6.5–8.1)

## 2017-08-19 LAB — IGG, IGA, IGM
IgA: 79 mg/dL (ref 64–422)
IgG (Immunoglobin G), Serum: 3033 mg/dL — ABNORMAL HIGH (ref 700–1600)
IgM (Immunoglobulin M), Srm: 119 mg/dL (ref 26–217)

## 2017-08-19 LAB — KAPPA/LAMBDA LIGHT CHAINS
KAPPA FREE LGHT CHN: 156.2 mg/L — AB (ref 3.3–19.4)
KAPPA, LAMDA LIGHT CHAIN RATIO: 14.33 — AB (ref 0.26–1.65)
LAMDA FREE LIGHT CHAINS: 10.9 mg/L (ref 5.7–26.3)

## 2017-08-21 NOTE — Progress Notes (Signed)
[place]  Telephone:([phone] [phone]  ID: Julie Khan OB: February 22, 1935  MR#: [id number]  JJO#:[id number]  Patient Care Team: Jodi Marble, MD as PCP - General (Internal Medicine)  CHIEF COMPLAINT: Smoldering myeloma  INTERVAL HISTORY: Patient returns to clinic today for repeat laboratory work and routine 62-monthfollow-up.  She continues to feel well and remains asymptomatic.  She has no neurologic complaints.  She denies any bony pain.  She denies any recent fevers or illnesses. She has a good appetite and denies weight loss. She has no chest pain or shortness of breath. She denies any nausea, vomiting, constipation, or diarrhea. She has no urinary complaints.  Patient feels at her baseline offers no specific complaints today.  REVIEW OF SYSTEMS:   Review of Systems  Constitutional: Negative.  Negative for fever, malaise/fatigue and weight loss.  Respiratory: Negative.  Negative for cough and shortness of breath.   Cardiovascular: Negative.  Negative for chest pain and leg swelling.  Gastrointestinal: Negative.  Negative for abdominal pain, blood in stool and melena.  Genitourinary: Negative.  Negative for dysuria.  Musculoskeletal: Negative.  Negative for back pain.  Skin: Negative.  Negative for rash.  Neurological: Negative.  Negative for sensory change, focal weakness and weakness.  Psychiatric/Behavioral: Negative.  The patient is not nervous/anxious.     As per HPI. Otherwise, a complete review of systems is negative.  PAST MEDICAL HISTORY: Past Medical History:  Diagnosis Date  . Arthritis   . Bladder cancer ([facility]   . Bladder cancer ([facility]   . Cancer ([place]    multiple myeloma  . Chronic kidney disease   . Chronic kidney disease   . Dizziness   . GERD (gastroesophageal reflux disease)   . Hypertension   . Multiple myeloma ([place]   . Stroke ([facility]    tia x 2  . Tubular adenoma of colon     PAST SURGICAL HISTORY: Past  Surgical History:  Procedure Laterality Date  . bladder tumor removed    . BREAST EXCISIONAL BIOPSY Bilateral 1970   Benign  . BREAST SURGERY     bx  . CATARACT EXTRACTION W/PHACO Left 12/16/2016   Procedure: CATARACT EXTRACTION PHACO AND INTRAOCULAR LENS PLACEMENT (IOC);  Surgeon: KEulogio Bear MD;  Location: [facility] ORS;  Service: Ophthalmology;  Laterality: Left;  [place]00:38.0 AP% 14.6 CDE 5.54 Fluid pack lot # [id number]  . CATARACT EXTRACTION W/PHACO Right 03/03/2017   Procedure: CATARACT EXTRACTION PHACO AND INTRAOCULAR LENS PLACEMENT ([place];  Surgeon: KEulogio Bear MD;  Location: [facility] ORS;  Service: Ophthalmology;  Laterality: Right;  Lot # [id number] [place] 00:29.8 AP%: 10.1 CDE: 3.01  . COLONOSCOPY WITH PROPOFOL N/A 07/25/2015   Procedure: COLONOSCOPY WITH PROPOFOL;  Surgeon: MJosefine Class MD;  [facility];  Service: Endoscopy;  Laterality: N/A;  . COLONOSCOPY WITH PROPOFOL N/A 06/29/2017   Procedure: COLONOSCOPY WITH PROPOFOL;  Surgeon: Toledo, TBenay Pike MD;  Location: [facility] ENDOSCOPY;  Service: Gastroenterology;  Laterality: N/A;  . ESOPHAGOGASTRODUODENOSCOPY (EGD) WITH PROPOFOL N/A 07/25/2015   Procedure: ESOPHAGOGASTRODUODENOSCOPY (EGD) WITH PROPOFOL;  Surgeon: MJosefine Class MD;  [facility];  Service: Endoscopy;  Laterality: N/A;  . ESOPHAGOGASTRODUODENOSCOPY (EGD) WITH PROPOFOL N/A 06/29/2017   Procedure: ESOPHAGOGASTRODUODENOSCOPY (EGD) WITH PROPOFOL;  Surgeon: Toledo, TBenay Pike MD;  Location: [facility] ENDOSCOPY;  Service: Gastroenterology;  Laterality: N/A;  . HERNIA REPAIR      FAMILY HISTORY: Family History  Problem Relation Age of Onset  . Cancer Mother   .  Colon cancer Mother   . Colon polyps Mother   . Cancer Sister   . Breast cancer Sister 74  . Cancer Brother   . Bladder Cancer Neg Hx   . Kidney cancer Neg Hx     ADVANCED DIRECTIVES (Y/N):  N  HEALTH MAINTENANCE: Social History   Tobacco Use  . Smoking status:  Former Research scientist (life sciences)  . Smokeless tobacco: Never Used  Substance Use Topics  . Alcohol use: No  . Drug use: No     Colonoscopy:  PAP:  Bone density:  Lipid panel:  Allergies  Allergen Reactions  . Iodine     Seizure  Betadine ok  . Penicillins Other (See Comments)    Has patient had a PCN reaction causing immediate rash, facial/tongue/throat swelling, SOB or lightheadedness with hypotension: Unknown Has patient had a PCN reaction causing severe rash involving mucus membranes or skin necrosis: Unknown Has patient had a PCN reaction that required hospitalization: Unknown Has patient had a PCN reaction occurring within the last 10 years: Unknown If all of the above answers are "NO", then may proceed with Cephalosporin use.     Current Outpatient Medications  Medication Sig Dispense Refill  . losartan (COZAAR) 25 MG tablet Take 25 mg by mouth daily.    . metoprolol succinate (TOPROL-XL) 25 MG 24 hr tablet Take 25 mg by mouth daily.    Marland Kitchen oxazepam (SERAX) 10 MG capsule Take 10 mg by mouth at bedtime as needed for sleep or anxiety.    . pantoprazole (PROTONIX) 40 MG tablet Take 40 mg by mouth daily.    . simvastatin (ZOCOR) 20 MG tablet Take 72m by mouth once daily  1   No current facility-administered medications for this visit.     OBJECTIVE: Vitals:   08/25/17 1339 08/25/17 1344  BP:  91/65  Pulse:  77  Resp: 12   Temp:  (!) 97.2 F (36.2 C)     Body mass index is 28.87 kg/m.    ECOG FS:0 - Asymptomatic  General: Well-developed, well-nourished, no acute distress. Eyes: Pink conjunctiva, anicteric sclera. HEENT: Normocephalic, moist mucous membranes, clear oropharnyx. Lungs: Clear to auscultation bilaterally. Heart: Regular rate and rhythm. No rubs, murmurs, or gallops. Abdomen: Soft, nontender, nondistended. No organomegaly noted, normoactive bowel sounds. Musculoskeletal: No edema, cyanosis, or clubbing. Neuro: Alert, answering all questions appropriately. Cranial  nerves grossly intact. Skin: No rashes or petechiae noted. Psych: Normal affect.  LAB RESULTS:  Lab Results  Component Value Date   NA 134 (L) 08/18/2017   K 4.7 08/18/2017   CL 103 08/18/2017   CO2 23 08/18/2017   GLUCOSE 93 08/18/2017   BUN 25 (H) 08/18/2017   CREATININE 1.28 (H) 08/18/2017   CALCIUM 9.3 08/18/2017   PROT 8.3 (H) 08/18/2017   ALBUMIN 3.5 08/18/2017   AST 41 08/18/2017   ALT 23 08/18/2017   ALKPHOS 100 08/18/2017   BILITOT 0.8 08/18/2017   GFRNONAA 38 (L) 08/18/2017   GFRAA 45 (L) 08/18/2017    Lab Results  Component Value Date   WBC 4.1 08/18/2017   NEUTROABS 2.2 08/18/2017   HGB 11.9 (L) 08/18/2017   HCT 34.4 (L) 08/18/2017   MCV 91.1 08/18/2017   PLT 195 08/18/2017   Lab Results  Component Value Date   TOTALPROTELP 7.9 08/18/2017   ALBUMINELP 3.7 04/14/2017   A1GS 0.2 04/14/2017   A2GS 0.8 04/14/2017   BETS 1.1 04/14/2017   GAMS 2.8 (H) 04/14/2017   MSPIKE 2.5 (H) 04/14/2017  SPEI Comment 04/14/2017     STUDIES: No results found.  ASSESSMENT: Smoldering myeloma  PLAN:   1. Smoldering myeloma: Bone marrow biopsy on December 23, 2011 revealed 15% plasma cells with cytogenetics having monosomy 13 in approximately 7%. Skeletal survey on February 20, 2016 was negative for any lesions. Patient's M spike continues to be stable ranging from approximately 2.0-2.5.  Today's result was 2.2.  Her IgG levels are significantly elevated at approximately 3000 which appears to be unchanged.  Kappa free light chains also remain elevated, but essentially stable. She has mild renal insufficiency, but otherwise no evidence of endorgan damage. No intervention is needed at this time. Patient does not require repeat bone marrow biopsy.  Return to clinic in 6 months with repeat laboratory work and further evaluation.   2. Renal insufficiency: Patient's creatinine appears to be approximately her baseline, monitor.   3. Hypertension: Patient's blood pressure is  mildly decreased today. Continue treatment per primary care.  Approximately 20 minutes was spent in discussion of which greater than 50% was consultation.  Patient expressed understanding and was in agreement with this plan. She also understands that She can call clinic at any time with any questions, concerns, or complaints.    Lloyd Huger, MD   08/31/2017 9:00 AM

## 2017-08-23 LAB — MULTIPLE MYELOMA PANEL, SERUM
ALBUMIN/GLOB SERPL: 0.8 (ref 0.7–1.7)
ALPHA2 GLOB SERPL ELPH-MCNC: 0.6 g/dL (ref 0.4–1.0)
Albumin SerPl Elph-Mcnc: 3.5 g/dL (ref 2.9–4.4)
Alpha 1: 0.2 g/dL (ref 0.0–0.4)
B-GLOBULIN SERPL ELPH-MCNC: 1 g/dL (ref 0.7–1.3)
Gamma Glob SerPl Elph-Mcnc: 2.5 g/dL — ABNORMAL HIGH (ref 0.4–1.8)
Globulin, Total: 4.4 g/dL — ABNORMAL HIGH (ref 2.2–3.9)
IGG (IMMUNOGLOBIN G), SERUM: 3220 mg/dL — AB (ref 700–1600)
IgA: 86 mg/dL (ref 64–422)
IgM (Immunoglobulin M), Srm: 120 mg/dL (ref 26–217)
M PROTEIN SERPL ELPH-MCNC: 2.2 g/dL — AB
Total Protein ELP: 7.9 g/dL (ref 6.0–8.5)

## 2017-08-25 ENCOUNTER — Encounter: Payer: Self-pay | Admitting: Oncology

## 2017-08-25 ENCOUNTER — Other Ambulatory Visit: Payer: Self-pay

## 2017-08-25 ENCOUNTER — Inpatient Hospital Stay (HOSPITAL_BASED_OUTPATIENT_CLINIC_OR_DEPARTMENT_OTHER): Payer: Medicare Other | Admitting: Oncology

## 2017-08-25 VITALS — BP 91/65 | HR 77 | Temp 97.2°F | Resp 12 | Ht 60.0 in | Wt 147.8 lb

## 2017-08-25 DIAGNOSIS — Z87891 Personal history of nicotine dependence: Secondary | ICD-10-CM | POA: Diagnosis not present

## 2017-08-25 DIAGNOSIS — N189 Chronic kidney disease, unspecified: Secondary | ICD-10-CM

## 2017-08-25 DIAGNOSIS — Z8673 Personal history of transient ischemic attack (TIA), and cerebral infarction without residual deficits: Secondary | ICD-10-CM

## 2017-08-25 DIAGNOSIS — Z79899 Other long term (current) drug therapy: Secondary | ICD-10-CM

## 2017-08-25 DIAGNOSIS — Z800 Family history of malignant neoplasm of digestive organs: Secondary | ICD-10-CM | POA: Diagnosis not present

## 2017-08-25 DIAGNOSIS — K219 Gastro-esophageal reflux disease without esophagitis: Secondary | ICD-10-CM

## 2017-08-25 DIAGNOSIS — I129 Hypertensive chronic kidney disease with stage 1 through stage 4 chronic kidney disease, or unspecified chronic kidney disease: Secondary | ICD-10-CM | POA: Diagnosis not present

## 2017-08-25 DIAGNOSIS — Z803 Family history of malignant neoplasm of breast: Secondary | ICD-10-CM | POA: Diagnosis not present

## 2017-08-25 DIAGNOSIS — Z8551 Personal history of malignant neoplasm of bladder: Secondary | ICD-10-CM

## 2017-08-25 DIAGNOSIS — C9000 Multiple myeloma not having achieved remission: Secondary | ICD-10-CM | POA: Diagnosis not present

## 2017-09-13 ENCOUNTER — Encounter: Payer: Self-pay | Admitting: Urology

## 2017-09-13 ENCOUNTER — Ambulatory Visit (INDEPENDENT_AMBULATORY_CARE_PROVIDER_SITE_OTHER): Payer: Medicare Other | Admitting: Urology

## 2017-09-13 VITALS — BP 122/77 | HR 65 | Ht 60.0 in | Wt 146.0 lb

## 2017-09-13 DIAGNOSIS — Z8551 Personal history of malignant neoplasm of bladder: Secondary | ICD-10-CM

## 2017-09-13 LAB — MICROSCOPIC EXAMINATION: RBC, UA: NONE SEEN /hpf (ref 0–2)

## 2017-09-13 LAB — URINALYSIS, COMPLETE
Bilirubin, UA: NEGATIVE
GLUCOSE, UA: NEGATIVE
Ketones, UA: NEGATIVE
Nitrite, UA: NEGATIVE
PROTEIN UA: NEGATIVE
RBC, UA: NEGATIVE
Specific Gravity, UA: 1.01 (ref 1.005–1.030)
UUROB: 0.2 mg/dL (ref 0.2–1.0)
pH, UA: 6 (ref 5.0–7.5)

## 2017-09-13 NOTE — Progress Notes (Signed)
   09/13/17  CC:  Chief Complaint  Patient presents with  . Cysto    HPI: 83 yo F with known personal history of bladder cancer who presents today for surveillance cystoscopy.  She had history of bladder cancer dx 20 years ago followed by 6 weeks BCG.  She had routine surveillance cystoscopy last in 2018 since that time.   Per personal report, she was told her cancer was stage 2 bladder cancer, dx and treated in [place].  It is unclear whether this was squamous or TCC.   She is from [place] and moved here 3 years ago from [place] (live there for 25 years).   She does have a history of "bladder lift" procedure 30+ years ago.    Overall today, she is doing extremely well.  She said no significant changes in her medical history.  No episodes of gross hematuria or any other significant urinary symptoms.   Blood pressure 100/65, pulse 76, height 5' (1.524 m), weight 150 lb (68 kg). NED. A&Ox3.   No respiratory distress   Abd soft, NT, ND Normal external genitalia with patent urethral meatus  Cystoscopy Procedure Note  Patient identification was confirmed, informed consent was obtained, and patient was prepped using Betadine solution.  Lidocaine jelly was administered per urethral meatus.    Preoperative abx where received prior to procedure.    Procedure: - Flexible cystoscope introduced, without any difficulty.   - Thorough search of the bladder revealed:    normal urethral meatus    normal urothelium    no stones    no ulcers     no tumors    no urethral polyps    no trabeculation  - Ureteral orifices were normal in position and appearance.  Post-Procedure: - Patient tolerated the procedure well  Assessment/ Plan:  1. Malignant neoplasm of urinary bladder, unspecified site [facility]) No evidence of disease today on cystoscopy.  We discussed the AUA guidelines and indications for surveillance cystoscopy. After 5 years without recurrence, it should be shared  decision-making between the patient and physician. Given that it spend quite some time from her most recent recurrence, I would recommend either cessation of cystoscopy or continue on an annual versus every other year basis.  At this point time, she would like to stop surveillance cystoscopies which is perfectly reasonable.  - Urinalysis, Complete - ciprofloxacin (CIPRO) tablet 500 mg; Take 1 tablet (500 mg total) by mouth once. - lidocaine (XYLOCAINE) 2 % jelly 1 application; Place 1 application into the urethra once.  F/u prn   Hollice Espy, MD

## 2017-10-05 DIAGNOSIS — D509 Iron deficiency anemia, unspecified: Secondary | ICD-10-CM | POA: Insufficient documentation

## 2017-11-04 DIAGNOSIS — R202 Paresthesia of skin: Secondary | ICD-10-CM | POA: Insufficient documentation

## 2017-11-04 DIAGNOSIS — R252 Cramp and spasm: Secondary | ICD-10-CM | POA: Insufficient documentation

## 2017-11-17 ENCOUNTER — Other Ambulatory Visit: Payer: Self-pay | Admitting: Internal Medicine

## 2017-11-17 DIAGNOSIS — Z1231 Encounter for screening mammogram for malignant neoplasm of breast: Secondary | ICD-10-CM

## 2017-12-27 ENCOUNTER — Ambulatory Visit
Admission: RE | Admit: 2017-12-27 | Discharge: 2017-12-27 | Disposition: A | Discharge status: Home / Self Care | Payer: Medicare Other | Source: Ambulatory Visit | Attending: Internal Medicine | Admitting: Internal Medicine

## 2017-12-27 ENCOUNTER — Ambulatory Visit (INDEPENDENT_AMBULATORY_CARE_PROVIDER_SITE_OTHER): Payer: Medicare Other | Admitting: Urology

## 2017-12-27 ENCOUNTER — Encounter: Payer: Self-pay | Admitting: Urology

## 2017-12-27 VITALS — BP 126/77 | HR 67 | Ht 60.0 in | Wt 149.0 lb

## 2017-12-27 DIAGNOSIS — R3989 Other symptoms and signs involving the genitourinary system: Secondary | ICD-10-CM

## 2017-12-27 DIAGNOSIS — Z8551 Personal history of malignant neoplasm of bladder: Secondary | ICD-10-CM

## 2017-12-27 DIAGNOSIS — Z1231 Encounter for screening mammogram for malignant neoplasm of breast: Secondary | ICD-10-CM | POA: Diagnosis present

## 2017-12-27 LAB — MICROSCOPIC EXAMINATION: RBC MICROSCOPIC, UA: NONE SEEN /HPF (ref 0–2)

## 2017-12-27 LAB — URINALYSIS, COMPLETE
Bilirubin, UA: NEGATIVE
Glucose, UA: NEGATIVE
KETONES UA: NEGATIVE
Leukocytes, UA: NEGATIVE
NITRITE UA: NEGATIVE
Protein, UA: NEGATIVE
RBC, UA: NEGATIVE
Specific Gravity, UA: 1.01 (ref 1.005–1.030)
UUROB: 0.2 mg/dL (ref 0.2–1.0)
pH, UA: 6 (ref 5.0–7.5)

## 2017-12-27 LAB — BLADDER SCAN AMB NON-IMAGING

## 2017-12-27 NOTE — Progress Notes (Signed)
12/27/2017 1:37 PM   Julie Khan [birth year] [id number]  Referring provider: Jodi Marble, MD [place], [place] [id number]  Chief Complaint  Patient presents with  . Bladder Cancer    HPI: 83 year old female presents today to discuss symptoms.  She reports 10 days ago, she had intense bladder pressure which last 48 hours.  She has some associated dysuria without gross hematuria.  She does not feel like she can empty her bladder well during these days.  After 2 days, her symptoms completely resolved and she is now voiding back at her baseline.    She is concerned that she may have had a bladder infection.  She is also concerned that she may have had recurrence of her prolapse which is not appreciated at the time of cystoscopy several months ago.  She denies any vaginal bulging or pressure.  She had history of bladder cancer dx 20 years ago followed by 6 weeks BCG. She had routine surveillance cystoscopy last in 2018 since that time.  Per personal report, she was told her cancer was stage 2bladder cancer, dx and treated in [place]. It is unclear whether this was squamous or TCC.   She is from [place] and moved here 3 years ago from [place] (live there for 25 years).   She does have a history of "bladder lift" procedure 30+ years ago.     PMH: Past Medical History:  Diagnosis Date  . Arthritis   . Bladder cancer ([place])   . Bladder cancer ([place])   . Cancer ([place])    multiple myeloma  . Chronic kidney disease   . Chronic kidney disease   . Dizziness   . GERD (gastroesophageal reflux disease)   . Hypertension   . Multiple myeloma ([place])   . Stroke ([place])    tia x 2  . Tubular adenoma of colon     Surgical History: Past Surgical History:  Procedure Laterality Date  . bladder tumor removed    . BREAST EXCISIONAL BIOPSY Bilateral 1970   Benign  . BREAST SURGERY     bx  . CATARACT EXTRACTION W/PHACO Left 12/16/2016   Procedure: CATARACT EXTRACTION  PHACO AND INTRAOCULAR LENS PLACEMENT (IOC);  Surgeon: Eulogio Bear, MD;  Location: [facility] ORS;  Service: Ophthalmology;  Laterality: Left;  [place] 00:38.0 AP% 14.6 CDE 5.54 Fluid pack lot # [id number]  . CATARACT EXTRACTION W/PHACO Right 03/03/2017   Procedure: CATARACT EXTRACTION PHACO AND INTRAOCULAR LENS PLACEMENT ([place]);  Surgeon: Eulogio Bear, MD;  Location: [facility] ORS;  Service: Ophthalmology;  Laterality: Right;  Lot # [id number] H [place]: 00:29.8 AP%: 10.1 CDE: 3.01  . COLONOSCOPY WITH PROPOFOL N/A 07/25/2015   Procedure: COLONOSCOPY WITH PROPOFOL;  Surgeon: Josefine Class, MD;  [facility];  Service: Endoscopy;  Laterality: N/A;  . COLONOSCOPY WITH PROPOFOL N/A 06/29/2017   Procedure: COLONOSCOPY WITH PROPOFOL;  Surgeon: Toledo, Benay Pike, MD;  Location: [facility] ENDOSCOPY;  Service: Gastroenterology;  Laterality: N/A;  . ESOPHAGOGASTRODUODENOSCOPY (EGD) WITH PROPOFOL N/A 07/25/2015   Procedure: ESOPHAGOGASTRODUODENOSCOPY (EGD) WITH PROPOFOL;  Surgeon: Josefine Class, MD;  [facility];  Service: Endoscopy;  Laterality: N/A;  . ESOPHAGOGASTRODUODENOSCOPY (EGD) WITH PROPOFOL N/A 06/29/2017   Procedure: ESOPHAGOGASTRODUODENOSCOPY (EGD) WITH PROPOFOL;  Surgeon: Toledo, Benay Pike, MD;  Location: [facility] ENDOSCOPY;  Service: Gastroenterology;  Laterality: N/A;  . HERNIA REPAIR      Home Medications:  Allergies as of 12/27/2017      Reactions   Iodine    Seizure  Betadine ok   Penicillins Other (See Comments)   Has patient had a PCN reaction causing immediate rash, facial/tongue/throat swelling, SOB or lightheadedness with hypotension: Unknown Has patient had a PCN reaction causing severe rash involving mucus membranes or skin necrosis: Unknown Has patient had a PCN reaction that required hospitalization: Unknown Has patient had a PCN reaction occurring within the last 10 years: Unknown If all of the above answers are "NO", then may proceed with Cephalosporin use.        Medication List        Accurate as of 12/27/17  1:37 PM. Always use your most recent med list.          losartan 25 MG tablet Commonly known as:  COZAAR Take 25 mg by mouth daily.   metoprolol succinate 25 MG 24 hr tablet Commonly known as:  TOPROL-XL Take 25 mg by mouth daily.   oxazepam 10 MG capsule Commonly known as:  SERAX Take 10 mg by mouth at bedtime as needed for sleep or anxiety.   pantoprazole 40 MG tablet Commonly known as:  PROTONIX Take 40 mg by mouth daily.   simvastatin 20 MG tablet Commonly known as:  ZOCOR Take 65m by mouth once daily       Allergies:  Allergies  Allergen Reactions  . Iodine     Seizure  Betadine ok  . Penicillins Other (See Comments)    Has patient had a PCN reaction causing immediate rash, facial/tongue/throat swelling, SOB or lightheadedness with hypotension: Unknown Has patient had a PCN reaction causing severe rash involving mucus membranes or skin necrosis: Unknown Has patient had a PCN reaction that required hospitalization: Unknown Has patient had a PCN reaction occurring within the last 10 years: Unknown If all of the above answers are "NO", then may proceed with Cephalosporin use.     Family History: Family History  Problem Relation Age of Onset  . Cancer Mother   . Colon cancer Mother   . Colon polyps Mother   . Cancer Sister   . Breast cancer Sister 434 . Cancer Brother   . Bladder Cancer Neg Hx   . Kidney cancer Neg Hx     Social History:  reports that she has quit smoking. She has never used smokeless tobacco. She reports that she does not drink alcohol or use drugs.  ROS: UROLOGY Frequent Urination?: No Hard to postpone urination?: No Burning/pain with urination?: No Get up at night to urinate?: No Leakage of urine?: No Urine stream starts and stops?: No Trouble starting stream?: No Do you have to strain to urinate?: No Blood in urine?: No Urinary tract infection?: No Sexually transmitted  disease?: No Injury to kidneys or bladder?: No Painful intercourse?: No Weak stream?: No Currently pregnant?: No Vaginal bleeding?: No Last menstrual period?: n  Gastrointestinal Nausea?: No Vomiting?: No Indigestion/heartburn?: No Diarrhea?: No Constipation?: No  Constitutional Fever: No Night sweats?: No Weight loss?: No Fatigue?: No  Skin Skin rash/lesions?: No Itching?: No  Eyes Blurred vision?: No Double vision?: No  Ears/Nose/Throat Sore throat?: No Sinus problems?: No  Hematologic/Lymphatic Swollen glands?: No Easy bruising?: No  Cardiovascular Leg swelling?: No Chest pain?: No  Respiratory Cough?: No Shortness of breath?: No  Endocrine Excessive thirst?: No  Musculoskeletal Back pain?: No Joint pain?: Yes  Neurological Headaches?: No Dizziness?: No  Psychologic Depression?: No Anxiety?: No  Physical Exam: There were no vitals taken for this visit.  Constitutional:  Alert and oriented, No acute distress. HEENT:  [place] AT, moist mucus membranes.  Trachea midline, no masses. Cardiovascular: No clubbing, cyanosis, or edema. Respiratory: Normal respiratory effort, no increased work of breathing. Skin: No rashes, bruises or suspicious lesions. Neurologic: Grossly intact, no focal deficits, moving all 4 extremities. Psychiatric: Normal mood and affect.  Laboratory Data: Lab Results  Component Value Date   WBC 4.1 08/18/2017   HGB 11.9 (L) 08/18/2017   HCT 34.4 (L) 08/18/2017   MCV 91.1 08/18/2017   PLT 195 08/18/2017    Lab Results  Component Value Date   CREATININE 1.28 (H) 08/18/2017    Urinalysis Urinalysis, negative for any signs of infection, leukocyte Estrace negative, no white blood cells or red blood cells, nitrate negative.   Pertinent Imaging: Results for orders placed or performed in visit on 12/27/17  BLADDER SCAN AMB NON-IMAGING  Result Value Ref Range   Scan Result 44m     Assessment & Plan:    1. Sensation  of pressure in bladder area Transient sensation of bladder pressure with dysuria lasting 2 days, no further symptoms over the last 7 days  UA today is unremarkable without concern for infection Adequate bladder emptying with minimal postvoid residual Etiology of discomfort unclear but it is resolved at this point, no further intervention She was advised to either call or contact [facility] My[facility] if she has recurrence of her symptoms  2. History of bladder cancer Previously discussed the option of discontinuation of surveillance See previous note for details - Urinalysis, Complete - BLADDER SCAN AMB NON-IMAGING  F/u prn  AHollice Espy MD  [id number] [place] [phone]

## 2018-02-20 ENCOUNTER — Inpatient Hospital Stay: Payer: Medicare Other | Attending: Oncology

## 2018-02-20 DIAGNOSIS — C9000 Multiple myeloma not having achieved remission: Secondary | ICD-10-CM | POA: Insufficient documentation

## 2018-02-20 DIAGNOSIS — Z8551 Personal history of malignant neoplasm of bladder: Secondary | ICD-10-CM | POA: Diagnosis not present

## 2018-02-20 DIAGNOSIS — I129 Hypertensive chronic kidney disease with stage 1 through stage 4 chronic kidney disease, or unspecified chronic kidney disease: Secondary | ICD-10-CM | POA: Insufficient documentation

## 2018-02-20 DIAGNOSIS — M129 Arthropathy, unspecified: Secondary | ICD-10-CM | POA: Insufficient documentation

## 2018-02-20 DIAGNOSIS — Z87891 Personal history of nicotine dependence: Secondary | ICD-10-CM | POA: Insufficient documentation

## 2018-02-20 DIAGNOSIS — Z8673 Personal history of transient ischemic attack (TIA), and cerebral infarction without residual deficits: Secondary | ICD-10-CM | POA: Diagnosis not present

## 2018-02-20 DIAGNOSIS — D649 Anemia, unspecified: Secondary | ICD-10-CM | POA: Diagnosis not present

## 2018-02-20 DIAGNOSIS — N189 Chronic kidney disease, unspecified: Secondary | ICD-10-CM | POA: Insufficient documentation

## 2018-02-20 DIAGNOSIS — K219 Gastro-esophageal reflux disease without esophagitis: Secondary | ICD-10-CM | POA: Diagnosis not present

## 2018-02-20 DIAGNOSIS — Z800 Family history of malignant neoplasm of digestive organs: Secondary | ICD-10-CM | POA: Diagnosis not present

## 2018-02-20 DIAGNOSIS — Z79899 Other long term (current) drug therapy: Secondary | ICD-10-CM | POA: Diagnosis not present

## 2018-02-20 LAB — COMPREHENSIVE METABOLIC PANEL
ALBUMIN: 3.7 g/dL (ref 3.5–5.0)
ALT: 20 U/L (ref 0–44)
AST: 35 U/L (ref 15–41)
Alkaline Phosphatase: 103 U/L (ref 38–126)
Anion gap: 7 (ref 5–15)
BILIRUBIN TOTAL: 0.5 mg/dL (ref 0.3–1.2)
BUN: 28 mg/dL — ABNORMAL HIGH (ref 8–23)
CO2: 25 mmol/L (ref 22–32)
CREATININE: 1.42 mg/dL — AB (ref 0.44–1.00)
Calcium: 9.1 mg/dL (ref 8.9–10.3)
Chloride: 103 mmol/L (ref 98–111)
GFR calc Af Amer: 39 mL/min — ABNORMAL LOW (ref >60)
GFR, EST NON AFRICAN AMERICAN: 34 mL/min — AB (ref >60)
GLUCOSE: 93 mg/dL (ref 70–99)
POTASSIUM: 4.5 mmol/L (ref 3.5–5.1)
Sodium: 135 mmol/L (ref 135–145)
TOTAL PROTEIN: 8.4 g/dL — AB (ref 6.5–8.1)

## 2018-02-20 LAB — CBC WITH DIFFERENTIAL/PLATELET
ABS IMMATURE GRANULOCYTES: 0.01 10*3/uL (ref 0.00–0.07)
Basophils Absolute: 0 10*3/uL (ref 0.0–0.1)
Basophils Relative: 0 %
EOS PCT: 1 %
Eosinophils Absolute: 0 10*3/uL (ref 0.0–0.5)
HCT: 35.9 % — ABNORMAL LOW (ref 36.0–46.0)
HEMOGLOBIN: 11.6 g/dL — AB (ref 12.0–15.0)
Immature Granulocytes: 0 %
LYMPHS PCT: 35 %
Lymphs Abs: 1.8 10*3/uL (ref 0.7–4.0)
MCH: 30.3 pg (ref 26.0–34.0)
MCHC: 32.3 g/dL (ref 30.0–36.0)
MCV: 93.7 fL (ref 80.0–100.0)
MONO ABS: 0.4 10*3/uL (ref 0.1–1.0)
MONOS PCT: 8 %
NEUTROS ABS: 2.7 10*3/uL (ref 1.7–7.7)
Neutrophils Relative %: 56 %
Platelets: 186 10*3/uL (ref 150–400)
RBC: 3.83 MIL/uL — ABNORMAL LOW (ref 3.87–5.11)
RDW: 12.7 % (ref 11.5–15.5)
WBC: 5 10*3/uL (ref 4.0–10.5)
nRBC: 0 % (ref 0.0–0.2)

## 2018-02-21 LAB — KAPPA/LAMBDA LIGHT CHAINS
KAPPA, LAMDA LIGHT CHAIN RATIO: 14.73 — AB (ref 0.26–1.65)
Kappa free light chain: 167.9 mg/L — ABNORMAL HIGH (ref 3.3–19.4)
LAMDA FREE LIGHT CHAINS: 11.4 mg/L (ref 5.7–26.3)

## 2018-02-21 LAB — IGG, IGA, IGM
IGA: 84 mg/dL (ref 64–422)
IgG (Immunoglobin G), Serum: 3294 mg/dL — ABNORMAL HIGH (ref 700–1600)
IgM (Immunoglobulin M), Srm: 123 mg/dL (ref 26–217)

## 2018-02-22 LAB — MULTIPLE MYELOMA PANEL, SERUM
ALBUMIN SERPL ELPH-MCNC: 3.6 g/dL (ref 2.9–4.4)
ALBUMIN/GLOB SERPL: 0.8 (ref 0.7–1.7)
Alpha 1: 0.2 g/dL (ref 0.0–0.4)
Alpha2 Glob SerPl Elph-Mcnc: 0.7 g/dL (ref 0.4–1.0)
B-Globulin SerPl Elph-Mcnc: 1 g/dL (ref 0.7–1.3)
GAMMA GLOB SERPL ELPH-MCNC: 2.7 g/dL — AB (ref 0.4–1.8)
GLOBULIN, TOTAL: 4.6 g/dL — AB (ref 2.2–3.9)
IgA: 85 mg/dL (ref 64–422)
IgG (Immunoglobin G), Serum: 3186 mg/dL — ABNORMAL HIGH (ref 700–1600)
IgM (Immunoglobulin M), Srm: 122 mg/dL (ref 26–217)
M Protein SerPl Elph-Mcnc: 2.3 g/dL — ABNORMAL HIGH
Total Protein ELP: 8.2 g/dL (ref 6.0–8.5)

## 2018-02-26 NOTE — Progress Notes (Signed)
[place]  [phone] [phone]  ID: Julie Khan OB: 06-01-1934  MR#: [id number]  VZD#:[id number]  Patient Care Team: Jodi Marble, MD as PCP - General (Internal Medicine)  CHIEF COMPLAINT: Smoldering myeloma  INTERVAL HISTORY: Patient returns to clinic today for repeat laboratory work and routine 110-monthevaluation.  She continues to feel well and remains asymptomatic. She has no neurologic complaints.  She denies any bony pain.  She denies any recent fevers or illnesses. She has a good appetite and denies weight loss. She has no chest pain or shortness of breath. She denies any nausea, vomiting, constipation, or diarrhea. She has no urinary complaints.  Patient feels at her baseline offers no specific complaints today.  REVIEW OF SYSTEMS:   Review of Systems  Constitutional: Negative.  Negative for fever, malaise/fatigue and weight loss.  Respiratory: Negative.  Negative for cough and shortness of breath.   Cardiovascular: Negative.  Negative for chest pain and leg swelling.  Gastrointestinal: Negative.  Negative for abdominal pain, blood in stool and melena.  Genitourinary: Negative.  Negative for dysuria.  Musculoskeletal: Negative.  Negative for back pain.  Skin: Negative.  Negative for rash.  Neurological: Negative.  Negative for sensory change, focal weakness and weakness.  Psychiatric/Behavioral: Negative.  The patient is not nervous/anxious.     As per HPI. Otherwise, a complete review of systems is negative.  PAST MEDICAL HISTORY: Past Medical History:  Diagnosis Date  . Arthritis   . Bladder cancer ([place]   . Bladder cancer ([place]   . Cancer ([place]    multiple myeloma  . Chronic kidney disease   . Chronic kidney disease   . Dizziness   . GERD (gastroesophageal reflux disease)   . Hypertension   . Multiple myeloma ([place]   . Stroke ([place]    tia x 2  . Tubular adenoma of colon     PAST SURGICAL HISTORY: Past  Surgical History:  Procedure Laterality Date  . bladder tumor removed    . BREAST EXCISIONAL BIOPSY Bilateral 1970   Benign  . BREAST SURGERY     bx  . CATARACT EXTRACTION W/PHACO Left 12/16/2016   Procedure: CATARACT EXTRACTION PHACO AND INTRAOCULAR LENS PLACEMENT (IOC);  Surgeon: KEulogio Bear MD;  Location: [facility] ORS;  Service: Ophthalmology;  Laterality: Left;  [place]00:38.0 AP% 14.6 CDE 5.54 Fluid pack lot # [id number]  . CATARACT EXTRACTION W/PHACO Right 03/03/2017   Procedure: CATARACT EXTRACTION PHACO AND INTRAOCULAR LENS PLACEMENT ([place];  Surgeon: KEulogio Bear MD;  Location: [facility] ORS;  Service: Ophthalmology;  Laterality: Right;  Lot # [id number] [place] 00:29.8 AP%: 10.1 CDE: 3.01  . COLONOSCOPY WITH PROPOFOL N/A 07/25/2015   Procedure: COLONOSCOPY WITH PROPOFOL;  Surgeon: MJosefine Class MD;  [facility];  Service: Endoscopy;  Laterality: N/A;  . COLONOSCOPY WITH PROPOFOL N/A 06/29/2017   Procedure: COLONOSCOPY WITH PROPOFOL;  Surgeon: Toledo, TBenay Pike MD;  Location: [facility] ENDOSCOPY;  Service: Gastroenterology;  Laterality: N/A;  . ESOPHAGOGASTRODUODENOSCOPY (EGD) WITH PROPOFOL N/A 07/25/2015   Procedure: ESOPHAGOGASTRODUODENOSCOPY (EGD) WITH PROPOFOL;  Surgeon: MJosefine Class MD;  [facility];  Service: Endoscopy;  Laterality: N/A;  . ESOPHAGOGASTRODUODENOSCOPY (EGD) WITH PROPOFOL N/A 06/29/2017   Procedure: ESOPHAGOGASTRODUODENOSCOPY (EGD) WITH PROPOFOL;  Surgeon: Toledo, TBenay Pike MD;  Location: [facility] ENDOSCOPY;  Service: Gastroenterology;  Laterality: N/A;  . HERNIA REPAIR      FAMILY HISTORY: Family History  Problem Relation Age of Onset  . Cancer Mother   .  Colon cancer Mother   . Colon polyps Mother   . Cancer Sister   . Breast cancer Sister 101  . Cancer Brother   . Bladder Cancer Neg Hx   . Kidney cancer Neg Hx     ADVANCED DIRECTIVES (Y/N):  N  HEALTH MAINTENANCE: Social History   Tobacco Use  . Smoking status:  Former Research scientist (life sciences)  . Smokeless tobacco: Never Used  Substance Use Topics  . Alcohol use: No  . Drug use: No     Colonoscopy:  PAP:  Bone density:  Lipid panel:  Allergies  Allergen Reactions  . Iodine     Seizure  Betadine ok  . Penicillins Other (See Comments)    Has patient had a PCN reaction causing immediate rash, facial/tongue/throat swelling, SOB or lightheadedness with hypotension: Unknown Has patient had a PCN reaction causing severe rash involving mucus membranes or skin necrosis: Unknown Has patient had a PCN reaction that required hospitalization: Unknown Has patient had a PCN reaction occurring within the last 10 years: Unknown If all of the above answers are "NO", then may proceed with Cephalosporin use.     Current Outpatient Medications  Medication Sig Dispense Refill  . losartan (COZAAR) 25 MG tablet Take 25 mg by mouth daily.    . meclizine (ANTIVERT) 25 MG tablet TK 1 T PO TID  1  . metoprolol succinate (TOPROL-XL) 25 MG 24 hr tablet Take 25 mg by mouth daily.    Marland Kitchen oxazepam (SERAX) 10 MG capsule Take 10 mg by mouth at bedtime as needed for sleep or anxiety.    . pantoprazole (PROTONIX) 40 MG tablet Take 40 mg by mouth daily.    . simvastatin (ZOCOR) 20 MG tablet Take 65m by mouth once daily  1   No current facility-administered medications for this visit.     OBJECTIVE: Vitals:   02/27/18 0916  BP: (!) 141/86  Pulse: 75  Temp: 98.5 F (36.9 C)     Body mass index is 29.49 kg/m.    ECOG FS:0 - Asymptomatic  General: Well-developed, well-nourished, no acute distress. Eyes: Pink conjunctiva, anicteric sclera. HEENT: Normocephalic, moist mucous membranes. Lungs: Clear to auscultation bilaterally. Heart: Regular rate and rhythm. No rubs, murmurs, or gallops. Abdomen: Soft, nontender, nondistended. No organomegaly noted, normoactive bowel sounds. Musculoskeletal: No edema, cyanosis, or clubbing. Neuro: Alert, answering all questions appropriately.  Cranial nerves grossly intact. Skin: No rashes or petechiae noted. Psych: Normal affect.  LAB RESULTS:  Lab Results  Component Value Date   NA 135 02/20/2018   K 4.5 02/20/2018   CL 103 02/20/2018   CO2 25 02/20/2018   GLUCOSE 93 02/20/2018   BUN 28 (H) 02/20/2018   CREATININE 1.42 (H) 02/20/2018   CALCIUM 9.1 02/20/2018   PROT 8.4 (H) 02/20/2018   ALBUMIN 3.7 02/20/2018   AST 35 02/20/2018   ALT 20 02/20/2018   ALKPHOS 103 02/20/2018   BILITOT 0.5 02/20/2018   GFRNONAA 34 (L) 02/20/2018   GFRAA 39 (L) 02/20/2018    Lab Results  Component Value Date   WBC 5.0 02/20/2018   NEUTROABS 2.7 02/20/2018   HGB 11.6 (L) 02/20/2018   HCT 35.9 (L) 02/20/2018   MCV 93.7 02/20/2018   PLT 186 02/20/2018   Lab Results  Component Value Date   TOTALPROTELP 8.2 02/20/2018   ALBUMINELP 3.7 04/14/2017   A1GS 0.2 04/14/2017   A2GS 0.8 04/14/2017   BETS 1.1 04/14/2017   GAMS 2.8 (H) 04/14/2017   MSPIKE 2.5 (H)  04/14/2017   SPEI Comment 04/14/2017     STUDIES: No results found.  ASSESSMENT: Smoldering myeloma  PLAN:   1. Smoldering myeloma: Bone marrow biopsy on December 23, 2011 revealed 15% plasma cells with cytogenetics having monosomy 13 in approximately 7%. Skeletal survey on February 20, 2016 was negative for any lesions. Patient's M spike continues to be stable ranging from approximately 2.0-2.5.  Today's result is 2.3.  Her IgG levels are significantly elevated at approximately 3000 which appears to be unchanged.  IgM and IgA continue to be within normal limits.  Kappa free light chains also remain elevated and are 167.9 with a kappa/lambda ratio 14.73.  These are also stable.  She continues to have a mild renal insufficiency, but no other evidence of endorgan damage. No intervention is needed at this time. Patient does not require repeat bone marrow biopsy.  Return to clinic in 4 months with repeat laboratory work and further evaluation. 2. Renal insufficiency: Patient's  creatinine is 1.42 which is approximately her baseline.  Monitor. 3.  Anemia: Patient has hemoglobin 11.6, monitor.  Patient expressed understanding and was in agreement with this plan. She also understands that She can call clinic at any time with any questions, concerns, or complaints.    Lloyd Huger, MD   03/05/2018 7:50 AM

## 2018-02-27 ENCOUNTER — Other Ambulatory Visit: Payer: Self-pay

## 2018-02-27 ENCOUNTER — Inpatient Hospital Stay (HOSPITAL_BASED_OUTPATIENT_CLINIC_OR_DEPARTMENT_OTHER): Payer: Medicare Other | Admitting: Oncology

## 2018-02-27 VITALS — BP 141/86 | HR 75 | Temp 98.5°F | Wt 151.0 lb

## 2018-02-27 DIAGNOSIS — C9000 Multiple myeloma not having achieved remission: Secondary | ICD-10-CM

## 2018-02-27 DIAGNOSIS — N189 Chronic kidney disease, unspecified: Secondary | ICD-10-CM | POA: Diagnosis not present

## 2018-02-27 DIAGNOSIS — Z800 Family history of malignant neoplasm of digestive organs: Secondary | ICD-10-CM

## 2018-02-27 DIAGNOSIS — M129 Arthropathy, unspecified: Secondary | ICD-10-CM | POA: Diagnosis not present

## 2018-02-27 DIAGNOSIS — K219 Gastro-esophageal reflux disease without esophagitis: Secondary | ICD-10-CM

## 2018-02-27 DIAGNOSIS — I129 Hypertensive chronic kidney disease with stage 1 through stage 4 chronic kidney disease, or unspecified chronic kidney disease: Secondary | ICD-10-CM

## 2018-02-27 DIAGNOSIS — Z8551 Personal history of malignant neoplasm of bladder: Secondary | ICD-10-CM

## 2018-02-27 DIAGNOSIS — Z87891 Personal history of nicotine dependence: Secondary | ICD-10-CM

## 2018-02-27 DIAGNOSIS — D649 Anemia, unspecified: Secondary | ICD-10-CM

## 2018-02-27 DIAGNOSIS — Z8673 Personal history of transient ischemic attack (TIA), and cerebral infarction without residual deficits: Secondary | ICD-10-CM

## 2018-02-27 DIAGNOSIS — Z79899 Other long term (current) drug therapy: Secondary | ICD-10-CM

## 2018-02-27 NOTE — Progress Notes (Signed)
Patient wanted to know when she could do her Bone Survey Met since she was doing them once a year. Patient stated that she had been doing well in the past 6 months with no complaints.

## 2018-05-23 ENCOUNTER — Telehealth: Payer: Self-pay | Admitting: *Deleted

## 2018-05-23 ENCOUNTER — Encounter: Payer: Self-pay | Admitting: Nurse Practitioner

## 2018-05-23 ENCOUNTER — Inpatient Hospital Stay: Payer: Medicare Other | Attending: Nurse Practitioner | Admitting: Nurse Practitioner

## 2018-05-23 ENCOUNTER — Inpatient Hospital Stay: Payer: Medicare Other

## 2018-05-23 ENCOUNTER — Other Ambulatory Visit: Payer: Self-pay

## 2018-05-23 VITALS — BP 154/91 | HR 80 | Temp 97.4°F

## 2018-05-23 DIAGNOSIS — Z800 Family history of malignant neoplasm of digestive organs: Secondary | ICD-10-CM | POA: Diagnosis not present

## 2018-05-23 DIAGNOSIS — N183 Chronic kidney disease, stage 3 (moderate): Secondary | ICD-10-CM | POA: Diagnosis not present

## 2018-05-23 DIAGNOSIS — K219 Gastro-esophageal reflux disease without esophagitis: Secondary | ICD-10-CM | POA: Diagnosis not present

## 2018-05-23 DIAGNOSIS — Z8551 Personal history of malignant neoplasm of bladder: Secondary | ICD-10-CM | POA: Diagnosis not present

## 2018-05-23 DIAGNOSIS — N189 Chronic kidney disease, unspecified: Secondary | ICD-10-CM | POA: Insufficient documentation

## 2018-05-23 DIAGNOSIS — N3001 Acute cystitis with hematuria: Secondary | ICD-10-CM

## 2018-05-23 DIAGNOSIS — R319 Hematuria, unspecified: Secondary | ICD-10-CM

## 2018-05-23 DIAGNOSIS — Z86010 Personal history of colonic polyps: Secondary | ICD-10-CM | POA: Insufficient documentation

## 2018-05-23 DIAGNOSIS — Z803 Family history of malignant neoplasm of breast: Secondary | ICD-10-CM | POA: Insufficient documentation

## 2018-05-23 DIAGNOSIS — I129 Hypertensive chronic kidney disease with stage 1 through stage 4 chronic kidney disease, or unspecified chronic kidney disease: Secondary | ICD-10-CM

## 2018-05-23 DIAGNOSIS — Z87891 Personal history of nicotine dependence: Secondary | ICD-10-CM | POA: Insufficient documentation

## 2018-05-23 DIAGNOSIS — M129 Arthropathy, unspecified: Secondary | ICD-10-CM | POA: Diagnosis not present

## 2018-05-23 DIAGNOSIS — C9000 Multiple myeloma not having achieved remission: Secondary | ICD-10-CM

## 2018-05-23 DIAGNOSIS — Z8673 Personal history of transient ischemic attack (TIA), and cerebral infarction without residual deficits: Secondary | ICD-10-CM

## 2018-05-23 DIAGNOSIS — Z79899 Other long term (current) drug therapy: Secondary | ICD-10-CM | POA: Diagnosis not present

## 2018-05-23 LAB — COMPREHENSIVE METABOLIC PANEL
ALT: 22 U/L (ref 0–44)
ANION GAP: 8 (ref 5–15)
AST: 37 U/L (ref 15–41)
Albumin: 3.8 g/dL (ref 3.5–5.0)
Alkaline Phosphatase: 110 U/L (ref 38–126)
BILIRUBIN TOTAL: 0.7 mg/dL (ref 0.3–1.2)
BUN: 19 mg/dL (ref 8–23)
CHLORIDE: 100 mmol/L (ref 98–111)
CO2: 26 mmol/L (ref 22–32)
Calcium: 9.1 mg/dL (ref 8.9–10.3)
Creatinine, Ser: 1.33 mg/dL — ABNORMAL HIGH (ref 0.44–1.00)
GFR calc Af Amer: 43 mL/min — ABNORMAL LOW (ref >60)
GFR calc non Af Amer: 37 mL/min — ABNORMAL LOW (ref >60)
Glucose, Bld: 97 mg/dL (ref 70–99)
POTASSIUM: 4.6 mmol/L (ref 3.5–5.1)
Sodium: 134 mmol/L — ABNORMAL LOW (ref 135–145)
TOTAL PROTEIN: 9.2 g/dL — AB (ref 6.5–8.1)

## 2018-05-23 LAB — CBC WITH DIFFERENTIAL/PLATELET
ABS IMMATURE GRANULOCYTES: 0.02 10*3/uL (ref 0.00–0.07)
BASOS ABS: 0 10*3/uL (ref 0.0–0.1)
Basophils Relative: 0 %
Eosinophils Absolute: 0 10*3/uL (ref 0.0–0.5)
Eosinophils Relative: 0 %
HEMATOCRIT: 36.5 % (ref 36.0–46.0)
HEMOGLOBIN: 12.2 g/dL (ref 12.0–15.0)
IMMATURE GRANULOCYTES: 0 %
LYMPHS PCT: 17 %
Lymphs Abs: 1.5 10*3/uL (ref 0.7–4.0)
MCH: 31 pg (ref 26.0–34.0)
MCHC: 33.4 g/dL (ref 30.0–36.0)
MCV: 92.9 fL (ref 80.0–100.0)
Monocytes Absolute: 0.6 10*3/uL (ref 0.1–1.0)
Monocytes Relative: 6 %
NEUTROS ABS: 6.8 10*3/uL (ref 1.7–7.7)
NEUTROS PCT: 77 %
Platelets: 202 10*3/uL (ref 150–400)
RBC: 3.93 MIL/uL (ref 3.87–5.11)
RDW: 12.3 % (ref 11.5–15.5)
WBC: 8.9 10*3/uL (ref 4.0–10.5)
nRBC: 0 % (ref 0.0–0.2)

## 2018-05-23 LAB — URINALYSIS, COMPLETE (UACMP) WITH MICROSCOPIC
Bilirubin Urine: NEGATIVE
Glucose, UA: NEGATIVE mg/dL
Ketones, ur: NEGATIVE mg/dL
Nitrite: NEGATIVE
Protein, ur: 30 mg/dL — AB
RBC / HPF: >50 RBC/hpf — ABNORMAL HIGH (ref 0–5)
Specific Gravity, Urine: 1.006 (ref 1.005–1.030)
WBC, UA: >50 WBC/hpf — ABNORMAL HIGH (ref 0–5)
pH: 8 (ref 5.0–8.0)

## 2018-05-23 MED ORDER — CIPROFLOXACIN HCL 250 MG PO TABS: 250.0000 mg | ORAL_TABLET | Freq: Two times a day (BID) | ORAL | 0 refills | Status: AC

## 2018-05-23 MED ORDER — PHENAZOPYRIDINE HCL 200 MG PO TABS: 200.0000 mg | ORAL_TABLET | Freq: Three times a day (TID) | ORAL | 0 refills | Status: DC | PRN

## 2018-05-23 NOTE — Patient Instructions (Signed)
Urinary Tract Infection, Adult A urinary tract infection (UTI) is an infection of any part of the urinary tract. The urinary tract includes:  The kidneys.  The ureters.  The bladder.  The urethra. These organs make, store, and get rid of pee (urine) in the body. What are the causes? This is caused by germs (bacteria) in your genital area. These germs grow and cause swelling (inflammation) of your urinary tract. What increases the risk? You are more likely to develop this condition if:  You have a small, thin tube (catheter) to drain pee.  You cannot control when you pee or poop (incontinence).  You are female, and: ? You use these methods to prevent pregnancy: ? A medicine that kills sperm (spermicide). ? A device that blocks sperm (diaphragm). ? You have low levels of a female hormone (estrogen). ? You are pregnant.  You have genes that add to your risk.  You are sexually active.  You take antibiotic medicines.  You have trouble peeing because of: ? A prostate that is bigger than normal, if you are female. ? A blockage in the part of your body that drains pee from the bladder (urethra). ? A kidney stone. ? A nerve condition that affects your bladder (neurogenic bladder). ? Not getting enough to drink. ? Not peeing often enough.  You have other conditions, such as: ? Diabetes. ? A weak disease-fighting system (immune system). ? Sickle cell disease. ? Gout. ? Injury of the spine. What are the signs or symptoms? Symptoms of this condition include:  Needing to pee right away (urgently).  Peeing often.  Peeing small amounts often.  Pain or burning when peeing.  Blood in the pee.  Pee that smells bad or not like normal.  Trouble peeing.  Pee that is cloudy.  Fluid coming from the vagina, if you are female.  Pain in the belly or lower back. Other symptoms include:  Throwing up (vomiting).  No urge to eat.  Feeling mixed up (confused).  Being tired  and grouchy (irritable).  A fever.  Watery poop (diarrhea). How is this treated? This condition may be treated with:  Antibiotic medicine.  Other medicines.  Drinking enough water. Follow these instructions at home:  Medicines  Take over-the-counter and prescription medicines only as told by your doctor.  If you were prescribed an antibiotic medicine, take it as told by your doctor. Do not stop taking it even if you start to feel better. General instructions  Make sure you: ? Pee until your bladder is empty. ? Do not hold pee for a long time. ? Empty your bladder after sex. ? Wipe from front to back after pooping if you are a female. Use each tissue one time when you wipe.  Drink enough fluid to keep your pee pale yellow.  Keep all follow-up visits as told by your doctor. This is important. Contact a doctor if:  You do not get better after 1-2 days.  Your symptoms go away and then come back. Get help right away if:  You have very bad back pain.  You have very bad pain in your lower belly.  You have a fever.  You are sick to your stomach (nauseous).  You are throwing up. Summary  A urinary tract infection (UTI) is an infection of any part of the urinary tract.  This condition is caused by germs in your genital area.  There are many risk factors for a UTI. These include having a small, thin   tube to drain pee and not being able to control when you pee or poop.  Treatment includes antibiotic medicines for germs.  Drink enough fluid to keep your pee pale yellow. This information is not intended to replace advice given to you by your health care provider. Make sure you discuss any questions you have with your health care provider. Document Released: 09/08/2007 Document Revised: 09/29/2017 Document Reviewed: 09/29/2017 Elsevier Interactive Patient Education  2019 Elsevier Inc.  

## 2018-05-23 NOTE — Telephone Encounter (Signed)
Patient called reporting that she has had blood in her urine for several days and wants to be seen as she has a hours/o bladder cancer 20 years ago prior to her MM diagnosis. Appointment accepted for 100

## 2018-05-23 NOTE — Progress Notes (Signed)
[facility] [place]  [phone] Fax:([phone]  Patient Care Team: Jodi Marble, MD as PCP - General (Internal Medicine)   Name of the patient: Julie Khan  [id number]  14-Dec-1934   Date of visit: 05/23/18  Diagnosis- Smoldering Myeloma & Distant history of Bladder Cancer  Chief complaint/ Reason for visit- Dysuria  Heme/Onc history:  Julie Khan, 84 year old female, is followed by Dr. Grayland Ormond for history of smoldering myeloma.  Bone marrow biopsy on 12/23/2011 revealed 15% plasma cells with cytogenetics having monosomy 13 and approximately 7%. Skeletal survey on 02/20/2016 was negative for lesions. Patient's M spike has been stable ranging from 2.0-2.5.  She has had chronic mild renal insufficiency without evidence of other endorgan damage.  Per patient, she had history of bladder cancer diagnosed approximately in 2000 followed by 6 weeks of BCG.  She had routine surveillance cystoscopy in 2018    No history exists.    Interval history- Julie Khan, 84 year old female with above history of smoldering myeloma and distant history of bladder cancer, presents with: hematuria, denies clots. Some associated dysuria. Describes urine as being red. She has had symptoms for 1 day. She also complains of suprapubic pressure and sensation of incomplete voiding. She denies fever. She does not have a history of recurrent UTI.  She does not have a history of pyelonephritis. She was previously seen by Dr. Erlene Quan, [facility], for similar symptoms on 12/27/2017. At that time, symptoms resolved spontaneously. She has not had recurrent symptoms since that time. She is also followed by Dr. Holley Raring at nephrology and had negative UA approximately 1 week ago per her report. She had 'bladder lift' procedure many years ago and questions if this could be related. No vaginal pressure or bulging. Had prior cystoscopy for  surveillance in 2019 that was negative.   ECOG FS:0 - Asymptomatic  Review of systems- Review of Systems  Constitutional: Negative for chills, fever, malaise/fatigue and weight loss.  HENT: Negative.   Eyes: Negative.   Respiratory: Negative.   Cardiovascular: Negative.   Gastrointestinal: Negative.  Negative for abdominal pain.  Genitourinary: Positive for dysuria, hematuria and urgency. Negative for flank pain and frequency.  Musculoskeletal: Negative for back pain, falls, joint pain, myalgias and neck pain.  Skin: Negative.   Neurological: Negative for dizziness and weakness.  Endo/Heme/Allergies: Does not bruise/bleed easily.  Psychiatric/Behavioral: The patient is nervous/anxious.      Current treatment- surveillance  Allergies  Allergen Reactions  . Iodine     Seizure  Betadine ok  . Penicillins Other (See Comments)    Has patient had a PCN reaction causing immediate rash, facial/tongue/throat swelling, SOB or lightheadedness with hypotension: Unknown Has patient had a PCN reaction causing severe rash involving mucus membranes or skin necrosis: Unknown Has patient had a PCN reaction that required hospitalization: Unknown Has patient had a PCN reaction occurring within the last 10 years: Unknown If all of the above answers are "NO", then may proceed with Cephalosporin use.     Past Medical History:  Diagnosis Date  . Arthritis   . Bladder cancer ([place])   . Bladder cancer ([place])   . Cancer ([place])    multiple myeloma  . Chronic kidney disease   . Chronic kidney disease   . Dizziness   . GERD (gastroesophageal reflux disease)   . Hypertension   . Multiple myeloma ([place])   . Stroke ([place])    tia x 2  . Tubular  adenoma of colon     Past Surgical History:  Procedure Laterality Date  . bladder tumor removed    . BREAST EXCISIONAL BIOPSY Bilateral 1970   Benign  . BREAST SURGERY     bx  . CATARACT EXTRACTION W/PHACO Left 12/16/2016   Procedure: CATARACT EXTRACTION  PHACO AND INTRAOCULAR LENS PLACEMENT (IOC);  Surgeon: Eulogio Bear, MD;  Location: [facility] ORS;  Service: Ophthalmology;  Laterality: Left;  [place] 00:38.0 AP% 14.6 CDE 5.54 Fluid pack lot # [id number]  . CATARACT EXTRACTION W/PHACO Right 03/03/2017   Procedure: CATARACT EXTRACTION PHACO AND INTRAOCULAR LENS PLACEMENT ([place]);  Surgeon: Eulogio Bear, MD;  Location: [facility] ORS;  Service: Ophthalmology;  Laterality: Right;  Lot # [id number] H [place]: 00:29.8 AP%: 10.1 CDE: 3.01  . COLONOSCOPY WITH PROPOFOL N/A 07/25/2015   Procedure: COLONOSCOPY WITH PROPOFOL;  Surgeon: Josefine Class, MD;  Location[facility] END[facility]COPY;  Service: Endoscopy;  Laterality: N/A;  . COLONOSCOPY WITH PROPOFOL N/A 06/29/2017   Procedure: COLONOSCOPY WITH PROPOFOL;  Surgeon: Toledo, Benay Pike, MD;  Location: [facility] ENDOSCOPY;  Service: Gastroenterology;  Laterality: N/A;  . ESOPHAGOGASTRODUODENOSCOPY (EGD) WITH PROPOFOL N/A 07/25/2015   Procedure: ESOPHAGOGASTRODUODENOSCOPY (EGD) WITH PROPOFOL;  Surgeon: Josefine Class, MD;  Location[facility];  Service: Endoscopy;  Laterality: N/A;  . ESOPHAGOGASTRODUODENOSCOPY (EGD) WITH PROPOFOL N/A 06/29/2017   Procedure: ESOPHAGOGASTRODUODENOSCOPY (EGD) WITH PROPOFOL;  Surgeon: Toledo, Benay Pike, MD;  Location: [facility] ENDOSCOPY;  Service: Gastroenterology;  Laterality: N/A;  . HERNIA REPAIR      Social History   Socioeconomic History  . Marital status: Divorced    Spouse name: Not on file  . Number of children: Not on file  . Years of education: Not on file  . Highest education level: Not on file  Occupational History  . Not on file  Social Needs  . Financial resource strain: Not on file  . Food insecurity:    Worry: Not on file    Inability: Not on file  . Transportation needs:    Medical: Not on file    Non-medical: Not on file  Tobacco Use  . Smoking status: Former Research scientist (life sciences)  . Smokeless tobacco: Never Used  Substance and Sexual Activity  . Alcohol use: No  . Drug  use: No  . Sexual activity: Not on file    Comment: Single   Lifestyle  . Physical activity:    Days per week: Not on file    Minutes per session: Not on file  . Stress: Not on file  Relationships  . Social connections:    Talks on phone: Not on file    Gets together: Not on file    Attends religious service: Not on file    Active member of club or organization: Not on file    Attends meetings of clubs or organizations: Not on file    Relationship status: Not on file  . Intimate partner violence:    Fear of current or ex partner: Not on file    Emotionally abused: Not on file    Physically abused: Not on file    Forced sexual activity: Not on file  Other Topics Concern  . Not on file  Social History Narrative  . Not on file    Family History  Problem Relation Age of Onset  . Cancer Mother   . Colon cancer Mother   . Colon polyps Mother   . Cancer Sister   . Breast cancer Sister 73  . Cancer Brother   .  Bladder Cancer Neg Hx   . Kidney cancer Neg Hx      Current Outpatient Medications:  .  losartan (COZAAR) 25 MG tablet, Take 25 mg by mouth daily., Disp: , Rfl:  .  meclizine (ANTIVERT) 25 MG tablet, TK 1 T PO TID, Disp: , Rfl: 1 .  metoprolol succinate (TOPROL-XL) 25 MG 24 hr tablet, Take 25 mg by mouth daily., Disp: , Rfl:  .  oxazepam (SERAX) 10 MG capsule, Take 10 mg by mouth at bedtime as needed for sleep or anxiety., Disp: , Rfl:  .  pantoprazole (PROTONIX) 40 MG tablet, Take 40 mg by mouth daily., Disp: , Rfl:  .  simvastatin (ZOCOR) 20 MG tablet, Take 92m by mouth once daily, Disp: , Rfl: 1  Physical exam:  Vitals:   05/23/18 1318  BP: (!) 154/91  Pulse: 80  Temp: (!) 97.4 F (36.3 C)  TempSrc: Tympanic   Physical Exam Constitutional:      General: She is not in acute distress.    Appearance: She is not ill-appearing.     Comments: accompanied  Cardiovascular:     Rate and Rhythm: Normal rate and regular rhythm.     Pulses: Normal pulses.    Pulmonary:     Effort: Pulmonary effort is normal. No respiratory distress.     Breath sounds: Normal breath sounds.  Abdominal:     General: Abdomen is flat. There is no distension.     Palpations: Abdomen is soft.     Tenderness: There is no abdominal tenderness. There is no guarding or rebound.  Musculoskeletal:     Right lower leg: No edema.     Left lower leg: No edema.  Skin:    General: Skin is warm and dry.  Neurological:     Mental Status: She is alert and oriented to person, place, and time.  Psychiatric:        Mood and Affect: Mood is anxious.      CMP Latest Ref Rng & Units 02/20/2018  Glucose 70 - 99 mg/dL 93  BUN 8 - 23 mg/dL 28(H)  Creatinine 0.44 - 1.00 mg/dL 1.42(H)  Sodium 135 - 145 mmol/L 135  Potassium 3.5 - 5.1 mmol/L 4.5  Chloride 98 - 111 mmol/L 103  CO2 22 - 32 mmol/L 25  Calcium 8.9 - 10.3 mg/dL 9.1  Total Protein 6.5 - 8.1 g/dL 8.4(H)  Total Bilirubin 0.3 - 1.2 mg/dL 0.5  Alkaline Phos 38 - 126 U/L 103  AST 15 - 41 U/L 35  ALT 0 - 44 U/L 20   CBC Latest Ref Rng & Units 02/20/2018  WBC 4.0 - 10.5 K/uL 5.0  Hemoglobin 12.0 - 15.0 g/dL 11.6(L)  Hematocrit 36.0 - 46.0 % 35.9(L)  Platelets 150 - 400 K/uL 186    No images are attached to the encounter.  No results found.  Assessment and plan- Patient is a 84y.o. female diagnosed with smoldering myeloma who presents to symptom management clinic for hematuria.  1.  UTI-symptoms and UA with microscopy consistent with UTI.  Culture grew out greater than 100,000 colonies of E. Coli.  Started on Cipro 250 mg bid x 5 days. Start pyridium TID prn x 2 days for bladder spasms.   2. Hematuria- hx of bladder cancer. UA today positive for RBCs but findings consistent with UTI.  Suspect hematuria related to acute urinary tract infection. Recommend f/u UA with urology in 3 weeks to re-evaluate and ensure hematuria has resolved. If  persistent, would consider cystoscopy.   Follow up with Urology or PCP in 3  weeks for repeat UA.   Visit Diagnosis 1. Acute cystitis with hematuria     Patient expressed understanding and was in agreement with this plan. She also understands that She can call clinic at any time with any questions, concerns, or complaints.   Thank you for allowing me to participate in the care of this very pleasant patient.   Beckey Rutter, DNP, AGNP-C [place] [facility] (work cell) [phone] (office)   CC: Dr. Grayland Ormond

## 2018-05-24 ENCOUNTER — Ambulatory Visit (INDEPENDENT_AMBULATORY_CARE_PROVIDER_SITE_OTHER): Payer: Medicare Other

## 2018-05-24 VITALS — BP 138/84 | HR 79 | Ht 60.0 in | Wt 151.0 lb

## 2018-05-24 DIAGNOSIS — R300 Dysuria: Secondary | ICD-10-CM

## 2018-05-24 LAB — URINALYSIS, COMPLETE
Bilirubin, UA: NEGATIVE
Glucose, UA: NEGATIVE
Ketones, UA: NEGATIVE
Leukocytes, UA: NEGATIVE
NITRITE UA: NEGATIVE
Protein, UA: NEGATIVE
RBC, UA: NEGATIVE
Specific Gravity, UA: 1.015 (ref 1.005–1.030)
UUROB: 0.2 mg/dL (ref 0.2–1.0)
pH, UA: 7 (ref 5.0–7.5)

## 2018-05-24 NOTE — Progress Notes (Signed)
Patient presents today complaining of blood in urine with burning urination. UA done. Patient was seen at oncologist yesterday for the same issue.  UA and culture done and patient was prescribed Cipro. Advised patient to stay on Cipro for the course and we can see her after treatment completed for UA to ensure infection is resolved. Patient verbalized understanding and made fu appt for 3 weeks.

## 2018-05-25 LAB — URINE CULTURE: Culture: >=100000 — AB

## 2018-05-26 LAB — CULTURE, URINE COMPREHENSIVE

## 2018-05-29 ENCOUNTER — Telehealth: Payer: Self-pay

## 2018-05-29 NOTE — Telephone Encounter (Signed)
-----   Message from Nori Riis, PA-C sent at 05/26/2018  4:44 PM EST ----- Please let Julie Khan know that the urine culture from the cancer center was positive for infection and the Cipro is the appropriate antibiotic and she needs to complete the course.  Our culture is negative, which means the Cipro is effective.

## 2018-05-29 NOTE — Telephone Encounter (Signed)
Called pt informed her of the information below. Pt gave verbal understanding. States that she feels much better.

## 2018-06-13 ENCOUNTER — Other Ambulatory Visit: Payer: Self-pay

## 2018-06-13 DIAGNOSIS — R300 Dysuria: Secondary | ICD-10-CM

## 2018-06-14 ENCOUNTER — Other Ambulatory Visit: Payer: Medicare Other

## 2018-06-14 ENCOUNTER — Other Ambulatory Visit: Payer: Self-pay

## 2018-06-14 DIAGNOSIS — R300 Dysuria: Secondary | ICD-10-CM

## 2018-06-14 LAB — URINALYSIS, COMPLETE
Bilirubin, UA: NEGATIVE
GLUCOSE, UA: NEGATIVE
Ketones, UA: NEGATIVE
LEUKOCYTES UA: NEGATIVE
Nitrite, UA: NEGATIVE
PROTEIN UA: NEGATIVE
RBC, UA: NEGATIVE
Specific Gravity, UA: 1.01 (ref 1.005–1.030)
Urobilinogen, Ur: 0.2 mg/dL (ref 0.2–1.0)
pH, UA: 6 (ref 5.0–7.5)

## 2018-06-16 LAB — CULTURE, URINE COMPREHENSIVE

## 2018-06-23 ENCOUNTER — Other Ambulatory Visit: Payer: Self-pay

## 2018-06-26 ENCOUNTER — Other Ambulatory Visit: Payer: Self-pay

## 2018-06-26 ENCOUNTER — Inpatient Hospital Stay: Payer: Medicare Other | Attending: Oncology

## 2018-06-26 DIAGNOSIS — Z803 Family history of malignant neoplasm of breast: Secondary | ICD-10-CM | POA: Diagnosis not present

## 2018-06-26 DIAGNOSIS — Z809 Family history of malignant neoplasm, unspecified: Secondary | ICD-10-CM | POA: Insufficient documentation

## 2018-06-26 DIAGNOSIS — Z87891 Personal history of nicotine dependence: Secondary | ICD-10-CM | POA: Diagnosis not present

## 2018-06-26 DIAGNOSIS — Z79899 Other long term (current) drug therapy: Secondary | ICD-10-CM | POA: Diagnosis not present

## 2018-06-26 DIAGNOSIS — Z8673 Personal history of transient ischemic attack (TIA), and cerebral infarction without residual deficits: Secondary | ICD-10-CM | POA: Diagnosis not present

## 2018-06-26 DIAGNOSIS — M129 Arthropathy, unspecified: Secondary | ICD-10-CM | POA: Insufficient documentation

## 2018-06-26 DIAGNOSIS — Z87440 Personal history of urinary (tract) infections: Secondary | ICD-10-CM | POA: Insufficient documentation

## 2018-06-26 DIAGNOSIS — Z8551 Personal history of malignant neoplasm of bladder: Secondary | ICD-10-CM | POA: Diagnosis not present

## 2018-06-26 DIAGNOSIS — K219 Gastro-esophageal reflux disease without esophagitis: Secondary | ICD-10-CM | POA: Diagnosis not present

## 2018-06-26 DIAGNOSIS — C9000 Multiple myeloma not having achieved remission: Secondary | ICD-10-CM | POA: Insufficient documentation

## 2018-06-26 DIAGNOSIS — N189 Chronic kidney disease, unspecified: Secondary | ICD-10-CM | POA: Insufficient documentation

## 2018-06-26 DIAGNOSIS — I129 Hypertensive chronic kidney disease with stage 1 through stage 4 chronic kidney disease, or unspecified chronic kidney disease: Secondary | ICD-10-CM | POA: Diagnosis not present

## 2018-06-26 DIAGNOSIS — D649 Anemia, unspecified: Secondary | ICD-10-CM | POA: Diagnosis not present

## 2018-06-26 LAB — CBC WITH DIFFERENTIAL/PLATELET
Abs Immature Granulocytes: 0 10*3/uL (ref 0.00–0.07)
BASOS PCT: 1 %
Basophils Absolute: 0 10*3/uL (ref 0.0–0.1)
Eosinophils Absolute: 0 10*3/uL (ref 0.0–0.5)
Eosinophils Relative: 1 %
HCT: 36.4 % (ref 36.0–46.0)
Hemoglobin: 11.8 g/dL — ABNORMAL LOW (ref 12.0–15.0)
Immature Granulocytes: 0 %
Lymphocytes Relative: 34 %
Lymphs Abs: 1.4 10*3/uL (ref 0.7–4.0)
MCH: 30.6 pg (ref 26.0–34.0)
MCHC: 32.4 g/dL (ref 30.0–36.0)
MCV: 94.5 fL (ref 80.0–100.0)
MONO ABS: 0.3 10*3/uL (ref 0.1–1.0)
MONOS PCT: 8 %
Neutro Abs: 2.4 10*3/uL (ref 1.7–7.7)
Neutrophils Relative %: 56 %
Platelets: 192 10*3/uL (ref 150–400)
RBC: 3.85 MIL/uL — AB (ref 3.87–5.11)
RDW: 12.8 % (ref 11.5–15.5)
WBC: 4.2 10*3/uL (ref 4.0–10.5)
nRBC: 0 % (ref 0.0–0.2)

## 2018-06-26 LAB — COMPREHENSIVE METABOLIC PANEL
ALT: 19 U/L (ref 0–44)
AST: 38 U/L (ref 15–41)
Albumin: 3.6 g/dL (ref 3.5–5.0)
Alkaline Phosphatase: 101 U/L (ref 38–126)
Anion gap: 7 (ref 5–15)
BUN: 24 mg/dL — ABNORMAL HIGH (ref 8–23)
CO2: 25 mmol/L (ref 22–32)
Calcium: 8.8 mg/dL — ABNORMAL LOW (ref 8.9–10.3)
Chloride: 101 mmol/L (ref 98–111)
Creatinine, Ser: 1.56 mg/dL — ABNORMAL HIGH (ref 0.44–1.00)
GFR calc Af Amer: 36 mL/min — ABNORMAL LOW (ref >60)
GFR calc non Af Amer: 31 mL/min — ABNORMAL LOW (ref >60)
GLUCOSE: 81 mg/dL (ref 70–99)
Potassium: 4.2 mmol/L (ref 3.5–5.1)
Sodium: 133 mmol/L — ABNORMAL LOW (ref 135–145)
TOTAL PROTEIN: 8.8 g/dL — AB (ref 6.5–8.1)
Total Bilirubin: 0.6 mg/dL (ref 0.3–1.2)

## 2018-06-27 LAB — MULTIPLE MYELOMA PANEL, SERUM
ALPHA2 GLOB SERPL ELPH-MCNC: 0.6 g/dL (ref 0.4–1.0)
Albumin SerPl Elph-Mcnc: 3.5 g/dL (ref 2.9–4.4)
Albumin/Glob SerPl: 0.8 (ref 0.7–1.7)
Alpha 1: 0.2 g/dL (ref 0.0–0.4)
B-Globulin SerPl Elph-Mcnc: 0.9 g/dL (ref 0.7–1.3)
Gamma Glob SerPl Elph-Mcnc: 2.6 g/dL — ABNORMAL HIGH (ref 0.4–1.8)
Globulin, Total: 4.4 g/dL — ABNORMAL HIGH (ref 2.2–3.9)
IgA: 84 mg/dL (ref 64–422)
IgG (Immunoglobin G), Serum: 3479 mg/dL — ABNORMAL HIGH (ref 700–1600)
IgM (Immunoglobulin M), Srm: 126 mg/dL (ref 26–217)
M Protein SerPl Elph-Mcnc: 2.2 g/dL — ABNORMAL HIGH
Total Protein ELP: 7.9 g/dL (ref 6.0–8.5)

## 2018-06-27 LAB — KAPPA/LAMBDA LIGHT CHAINS
Kappa free light chain: 192.5 mg/L — ABNORMAL HIGH (ref 3.3–19.4)
Kappa, lambda light chain ratio: 17.66 — ABNORMAL HIGH (ref 0.26–1.65)
Lambda free light chains: 10.9 mg/L (ref 5.7–26.3)

## 2018-06-27 LAB — IGG, IGA, IGM
IGG (IMMUNOGLOBIN G), SERUM: 3488 mg/dL — AB (ref 700–1600)
IgA: 86 mg/dL (ref 64–422)
IgM (Immunoglobulin M), Srm: 127 mg/dL (ref 26–217)

## 2018-06-30 ENCOUNTER — Other Ambulatory Visit: Payer: Self-pay

## 2018-07-03 ENCOUNTER — Other Ambulatory Visit: Payer: Self-pay

## 2018-07-03 ENCOUNTER — Inpatient Hospital Stay (HOSPITAL_BASED_OUTPATIENT_CLINIC_OR_DEPARTMENT_OTHER): Payer: Medicare Other | Admitting: Oncology

## 2018-07-03 DIAGNOSIS — C9000 Multiple myeloma not having achieved remission: Secondary | ICD-10-CM | POA: Diagnosis not present

## 2018-07-03 DIAGNOSIS — D472 Monoclonal gammopathy: Secondary | ICD-10-CM

## 2018-07-03 NOTE — Progress Notes (Signed)
Patient visit today for follow up regarding myeloma. Patient reports she was recently seen by urology for blood in her urine, was found to be infection and treated with 5 days of antibiotics.

## 2018-07-03 NOTE — Progress Notes (Signed)
[place]  Telephone:([phone] [phone]  ID: Julie Khan OB: 08-Dec-1934  MR#: [id number]  WLN#:[id number]  Patient Care Team: Jodi Marble, MD as PCP - General (Internal Medicine)  Virtual Visit via Telephone Note  I connected with Julie Khan on 07/03/18 at 10:30 AM EDT by telephone and verified that I am speaking with the correct person using two identifiers.   I discussed the limitations, risks, security and privacy concerns of performing an evaluation and management service by telephone and the availability of in person appointments. I also discussed with the patient that there may be a patient responsible charge related to this service. The patient expressed understanding and agreed to proceed.   CHIEF COMPLAINT: Smoldering myeloma  INTERVAL HISTORY: Patient agreed to be evaluated and discussed her laboratory work over the telephone.  She continues to feel well and remains asymptomatic.  She had a recent urinary tract infection and hematuria, but this resolved with antibiotics. She has no neurologic complaints.  She denies any bony pain.  She denies any fevers.  She has a good appetite and denies weight loss.  She denies any chest pain, shortness of breath, cough, or hemoptysis.  She denies any nausea, vomiting, constipation, or diarrhea.  She has no further urinary complaints.  Patient offers no specific complaints today.  REVIEW OF SYSTEMS:   Review of Systems  Constitutional: Negative.  Negative for fever, malaise/fatigue and weight loss.  Respiratory: Negative.  Negative for cough and shortness of breath.   Cardiovascular: Negative.  Negative for chest pain and leg swelling.  Gastrointestinal: Negative.  Negative for abdominal pain, blood in stool and melena.  Genitourinary: Negative.  Negative for dysuria.  Musculoskeletal: Negative.  Negative for back pain.  Skin: Negative.  Negative for rash.  Neurological: Negative.  Negative  for sensory change, focal weakness and weakness.  Psychiatric/Behavioral: Negative.  The patient is not nervous/anxious.     As per HPI. Otherwise, a complete review of systems is negative.  PAST MEDICAL HISTORY: Past Medical History:  Diagnosis Date  . Arthritis   . Bladder cancer ([place])   . Bladder cancer ([place])   . Cancer ([place])    multiple myeloma  . Chronic kidney disease   . Chronic kidney disease   . Dizziness   . GERD (gastroesophageal reflux disease)   . Hypertension   . Multiple myeloma ([place])   . Stroke ([place])    tia x 2  . Tubular adenoma of colon     PAST SURGICAL HISTORY: Past Surgical History:  Procedure Laterality Date  . bladder tumor removed    . BREAST EXCISIONAL BIOPSY Bilateral 1970   Benign  . BREAST SURGERY     bx  . CATARACT EXTRACTION W/PHACO Left 12/16/2016   Procedure: CATARACT EXTRACTION PHACO AND INTRAOCULAR LENS PLACEMENT (IOC);  Surgeon: Eulogio Bear, MD;  Location: [facility] ORS;  Service: Ophthalmology;  Laterality: Left;  [place] 00:38.0 AP% 14.6 CDE 5.54 Fluid pack lot # [id number]  . CATARACT EXTRACTION W/PHACO Right 03/03/2017   Procedure: CATARACT EXTRACTION PHACO AND INTRAOCULAR LENS PLACEMENT ([place]);  Surgeon: Eulogio Bear, MD;  Location: [facility] ORS;  Service: Ophthalmology;  Laterality: Right;  Lot # [id number] H [place]: 00:29.8 AP%: 10.1 CDE: 3.01  . COLONOSCOPY WITH PROPOFOL N/A 07/25/2015   Procedure: COLONOSCOPY WITH PROPOFOL;  Surgeon: Josefine Class, MD;  Location[facility] END[facility]COPY;  Service: Endoscopy;  Laterality: N/A;  . COLONOSCOPY WITH PROPOFOL N/A 06/29/2017   Procedure: COLONOSCOPY WITH PROPOFOL;  Surgeon:  [place], Benay Pike, MD;  Location: [facility] ENDOSCOPY;  Service: Gastroenterology;  Laterality: N/A;  . ESOPHAGOGASTRODUODENOSCOPY (EGD) WITH PROPOFOL N/A 07/25/2015   Procedure: ESOPHAGOGASTRODUODENOSCOPY (EGD) WITH PROPOFOL;  Surgeon: Josefine Class, MD;  Location: [facility] ENDOSCOPY;  Service: Endoscopy;  Laterality: N/A;  .  ESOPHAGOGASTRODUODENOSCOPY (EGD) WITH PROPOFOL N/A 06/29/2017   Procedure: ESOPHAGOGASTRODUODENOSCOPY (EGD) WITH PROPOFOL;  Surgeon: [place], Benay Pike, MD;  Location: [facility] ENDOSCOPY;  Service: Gastroenterology;  Laterality: N/A;  . HERNIA REPAIR      FAMILY HISTORY: Family History  Problem Relation Age of Onset  . Cancer Mother   . Colon cancer Mother   . Colon polyps Mother   . Cancer Sister   . Breast cancer Sister 37  . Cancer Brother   . Bladder Cancer Neg Hx   . Kidney cancer Neg Hx     ADVANCED DIRECTIVES (Y/N):  N  HEALTH MAINTENANCE: Social History   Tobacco Use  . Smoking status: Former Research scientist (life sciences)  . Smokeless tobacco: Never Used  Substance Use Topics  . Alcohol use: No  . Drug use: No     Colonoscopy:  PAP:  Bone density:  Lipid panel:  Allergies  Allergen Reactions  . Iodine     Seizure  Betadine ok  . Penicillins Other (See Comments)    Has patient had a PCN reaction causing immediate rash, facial/tongue/throat swelling, SOB or lightheadedness with hypotension: Unknown Has patient had a PCN reaction causing severe rash involving mucus membranes or skin necrosis: Unknown Has patient had a PCN reaction that required hospitalization: Unknown Has patient had a PCN reaction occurring within the last 10 years: Unknown If all of the above answers are "NO", then may proceed with Cephalosporin use.     Current Outpatient Medications  Medication Sig Dispense Refill  . losartan (COZAAR) 25 MG tablet Take 25 mg by mouth daily.    . meclizine (ANTIVERT) 25 MG tablet TK 1 T PO TID  1  . metoprolol succinate (TOPROL-XL) 25 MG 24 hr tablet Take 25 mg by mouth daily.    . pantoprazole (PROTONIX) 40 MG tablet Take 40 mg by mouth daily.    . simvastatin (ZOCOR) 20 MG tablet Take '20mg'$  by mouth once daily  1   No current facility-administered medications for this visit.     OBJECTIVE: There were no vitals filed for this visit.   There is no height or weight on file to  calculate BMI.    ECOG FS:0 - Asymptomatic  LAB RESULTS:  Lab Results  Component Value Date   NA 133 (L) 06/26/2018   K 4.2 06/26/2018   CL 101 06/26/2018   CO2 25 06/26/2018   GLUCOSE 81 06/26/2018   BUN 24 (H) 06/26/2018   CREATININE 1.56 (H) 06/26/2018   CALCIUM 8.8 (L) 06/26/2018   PROT 8.8 (H) 06/26/2018   ALBUMIN 3.6 06/26/2018   AST 38 06/26/2018   ALT 19 06/26/2018   ALKPHOS 101 06/26/2018   BILITOT 0.6 06/26/2018   GFRNONAA 31 (L) 06/26/2018   GFRAA 36 (L) 06/26/2018    Lab Results  Component Value Date   WBC 4.2 06/26/2018   NEUTROABS 2.4 06/26/2018   HGB 11.8 (L) 06/26/2018   HCT 36.4 06/26/2018   MCV 94.5 06/26/2018   PLT 192 06/26/2018   Lab Results  Component Value Date   TOTALPROTELP 7.9 06/26/2018   ALBUMINELP 3.7 04/14/2017   A1GS 0.2 04/14/2017   A2GS 0.8 04/14/2017   BETS 1.1 04/14/2017   GAMS 2.8 (  H) 04/14/2017   MSPIKE 2.5 (H) 04/14/2017   SPEI Comment 04/14/2017     STUDIES: No results found.  ASSESSMENT: Smoldering myeloma  PLAN:   1. Smoldering myeloma: Bone marrow biopsy on December 23, 2011 revealed 15% plasma cells with cytogenetics having monosomy 13 in approximately 7%. Skeletal survey on February 20, 2016 was negative for any lesions. Patient's M spike continues to be stable ranging from approximately 2.0-2.5.  Today's result is 2.2.  Her IgG levels remain significantly elevated greater than 3000.  Today's result is 3479 which is approximately her baseline.  IgM and IgA continue to be within normal limits.  Kappa free light chains also remain elevated and slightly increased to 192.5 with a kappa/lambda ratio of 17.66. She continues to have a mild renal insufficiency, but no other evidence of endorgan damage. No intervention is needed at this time. Patient does not require repeat bone marrow biopsy.  Return to clinic in 4 months with repeat laboratory work and further evaluation. 2. Renal insufficiency: Patient's creatinine is 1.56  which is slightly above her baseline.  Continue to monitor closely. 3.  Anemia: Mild, chronic, and unchanged with a hemoglobin of 11.8. 4.  UTI: Resolved with antibiotics.   I discussed the assessment and treatment plan with the patient. The patient was provided an opportunity to ask questions and all were answered. The patient agreed with the plan and demonstrated an understanding of the instructions.   The patient was advised to call back or seek an in-person evaluation if the symptoms worsen or if the condition fails to improve as anticipated.  I provided 15 minutes of non-face-to-face time during this encounter.   Lloyd Huger, MD   07/03/2018 12:41 PM

## 2018-07-13 ENCOUNTER — Telehealth: Payer: Self-pay | Admitting: Urology

## 2018-07-13 NOTE — Telephone Encounter (Signed)
Spoke with patient per Dr. Diamantina Providence to drop off a urine sample. Patient states that she is not in town and cannot drop one off. She was encouraged to increase water intake and try AZO OTC for dysuria. If her symptoms continue or worsen she will call back next week when she returns to drop of a UA

## 2018-07-13 NOTE — Telephone Encounter (Signed)
Pt states she had bladder infection February/march and was given antibiotics, pt completed course of abx, pt calls office stating that she is still having symptoms, burning with urination, denies fever, chills, nausea, vomiting.  Please advise pt at [phone].

## 2018-11-01 ENCOUNTER — Other Ambulatory Visit: Payer: Self-pay

## 2018-11-02 ENCOUNTER — Other Ambulatory Visit: Payer: Self-pay

## 2018-11-02 ENCOUNTER — Inpatient Hospital Stay: Payer: Medicare Other | Attending: Oncology

## 2018-11-02 DIAGNOSIS — C9000 Multiple myeloma not having achieved remission: Secondary | ICD-10-CM | POA: Insufficient documentation

## 2018-11-02 LAB — CBC WITH DIFFERENTIAL/PLATELET
Abs Immature Granulocytes: 0.01 10*3/uL (ref 0.00–0.07)
Basophils Absolute: 0 10*3/uL (ref 0.0–0.1)
Basophils Relative: 1 %
Eosinophils Absolute: 0 10*3/uL (ref 0.0–0.5)
Eosinophils Relative: 0 %
HCT: 34 % — ABNORMAL LOW (ref 36.0–46.0)
Hemoglobin: 11.7 g/dL — ABNORMAL LOW (ref 12.0–15.0)
Immature Granulocytes: 0 %
Lymphocytes Relative: 40 %
Lymphs Abs: 1.9 10*3/uL (ref 0.7–4.0)
MCH: 31.4 pg (ref 26.0–34.0)
MCHC: 34.4 g/dL (ref 30.0–36.0)
MCV: 91.2 fL (ref 80.0–100.0)
Monocytes Absolute: 0.4 10*3/uL (ref 0.1–1.0)
Monocytes Relative: 8 %
Neutro Abs: 2.4 10*3/uL (ref 1.7–7.7)
Neutrophils Relative %: 51 %
Platelets: 202 10*3/uL (ref 150–400)
RBC: 3.73 MIL/uL — ABNORMAL LOW (ref 3.87–5.11)
RDW: 12.2 % (ref 11.5–15.5)
WBC: 4.7 10*3/uL (ref 4.0–10.5)
nRBC: 0 % (ref 0.0–0.2)

## 2018-11-02 LAB — COMPREHENSIVE METABOLIC PANEL
ALT: 17 U/L (ref 0–44)
AST: 34 U/L (ref 15–41)
Albumin: 3.9 g/dL (ref 3.5–5.0)
Alkaline Phosphatase: 99 U/L (ref 38–126)
Anion gap: 7 (ref 5–15)
BUN: 32 mg/dL — ABNORMAL HIGH (ref 8–23)
CO2: 23 mmol/L (ref 22–32)
Calcium: 9.2 mg/dL (ref 8.9–10.3)
Chloride: 99 mmol/L (ref 98–111)
Creatinine, Ser: 1.49 mg/dL — ABNORMAL HIGH (ref 0.44–1.00)
GFR calc Af Amer: 38 mL/min — ABNORMAL LOW (ref >60)
GFR calc non Af Amer: 32 mL/min — ABNORMAL LOW (ref >60)
Glucose, Bld: 98 mg/dL (ref 70–99)
Potassium: 5 mmol/L (ref 3.5–5.1)
Sodium: 129 mmol/L — ABNORMAL LOW (ref 135–145)
Total Bilirubin: 0.7 mg/dL (ref 0.3–1.2)
Total Protein: 8.8 g/dL — ABNORMAL HIGH (ref 6.5–8.1)

## 2018-11-03 LAB — IGG, IGA, IGM
IgA: 81 mg/dL (ref 64–422)
IgG (Immunoglobin G), Serum: 3460 mg/dL — ABNORMAL HIGH (ref 586–1602)
IgM (Immunoglobulin M), Srm: 118 mg/dL (ref 26–217)

## 2018-11-03 LAB — KAPPA/LAMBDA LIGHT CHAINS
Kappa free light chain: 162.7 mg/L — ABNORMAL HIGH (ref 3.3–19.4)
Kappa, lambda light chain ratio: 14.93 — ABNORMAL HIGH (ref 0.26–1.65)
Lambda free light chains: 10.9 mg/L (ref 5.7–26.3)

## 2018-11-05 NOTE — Progress Notes (Signed)
[place]  Telephone:([phone] [phone]  ID: Julie Khan OB: November 27, 1934  MR#: [id number]  WLN#:[id number]  Patient Care Team: Jodi Marble, MD as PCP - General (Internal Medicine)   CHIEF COMPLAINT: Smoldering myeloma  INTERVAL HISTORY: Patient returns to clinic today for further evaluation and discussion of her laboratory work.  She continues to feel well and remains asymptomatic. She has no neurologic complaints.  She denies any bony pain.  She denies any recent fevers or illnesses.  She has a good appetite and denies weight loss.  She denies any chest pain, shortness of breath, cough, or hemoptysis.  She denies any nausea, vomiting, constipation, or diarrhea.  She has no urinary complaints.  Patient feels at her baseline offers no specific complaints today.  REVIEW OF SYSTEMS:   Review of Systems  Constitutional: Negative.  Negative for fever, malaise/fatigue and weight loss.  Respiratory: Negative.  Negative for cough and shortness of breath.   Cardiovascular: Negative.  Negative for chest pain and leg swelling.  Gastrointestinal: Negative.  Negative for abdominal pain, blood in stool and melena.  Genitourinary: Negative.  Negative for dysuria.  Musculoskeletal: Negative.  Negative for back pain.  Skin: Negative.  Negative for rash.  Neurological: Negative.  Negative for sensory change, focal weakness and weakness.  Psychiatric/Behavioral: Negative.  The patient is not nervous/anxious.     As per HPI. Otherwise, a complete review of systems is negative.  PAST MEDICAL HISTORY: Past Medical History:  Diagnosis Date  . Arthritis   . Bladder cancer ([place])   . Bladder cancer ([place])   . Cancer ([place])    multiple myeloma  . Chronic kidney disease   . Chronic kidney disease   . Dizziness   . GERD (gastroesophageal reflux disease)   . Hypertension   . Multiple myeloma ([place])   . Stroke ([place])    tia x 2  . Tubular adenoma of colon      PAST SURGICAL HISTORY: Past Surgical History:  Procedure Laterality Date  . bladder tumor removed    . BREAST EXCISIONAL BIOPSY Bilateral 1970   Benign  . BREAST SURGERY     bx  . CATARACT EXTRACTION W/PHACO Left 12/16/2016   Procedure: CATARACT EXTRACTION PHACO AND INTRAOCULAR LENS PLACEMENT (IOC);  Surgeon: Eulogio Bear, MD;  Location: [facility] ORS;  Service: Ophthalmology;  Laterality: Left;  [place] 00:38.0 AP% 14.6 CDE 5.54 Fluid pack lot # [id number]  . CATARACT EXTRACTION W/PHACO Right 03/03/2017   Procedure: CATARACT EXTRACTION PHACO AND INTRAOCULAR LENS PLACEMENT ([place]);  Surgeon: Eulogio Bear, MD;  Location: [facility] ORS;  Service: Ophthalmology;  Laterality: Right;  Lot # [id number] H [place]: 00:29.8 AP%: 10.1 CDE: 3.01  . COLONOSCOPY WITH PROPOFOL N/A 07/25/2015   Procedure: COLONOSCOPY WITH PROPOFOL;  Surgeon: Josefine Class, MD;  Location: [facility] ENDOSCOPY;  Service: Endoscopy;  Laterality: N/A;  . COLONOSCOPY WITH PROPOFOL N/A 06/29/2017   Procedure: COLONOSCOPY WITH PROPOFOL;  Surgeon: [place], Benay Pike, MD;  Location: [facility] ENDOSCOPY;  Service: Gastroenterology;  Laterality: N/A;  . ESOPHAGOGASTRODUODENOSCOPY (EGD) WITH PROPOFOL N/A 07/25/2015   Procedure: ESOPHAGOGASTRODUODENOSCOPY (EGD) WITH PROPOFOL;  Surgeon: Josefine Class, MD;  Location: [facility] ENDOSCOPY;  Service: Endoscopy;  Laterality: N/A;  . ESOPHAGOGASTRODUODENOSCOPY (EGD) WITH PROPOFOL N/A 06/29/2017   Procedure: ESOPHAGOGASTRODUODENOSCOPY (EGD) WITH PROPOFOL;  Surgeon: [place], Benay Pike, MD;  Location: [facility] ENDOSCOPY;  Service: Gastroenterology;  Laterality: N/A;  . HERNIA REPAIR      FAMILY HISTORY: Family History  Problem Relation Age of  Onset  . Cancer Mother   . Colon cancer Mother   . Colon polyps Mother   . Cancer Sister   . Breast cancer Sister 93  . Cancer Brother   . Bladder Cancer Neg Hx   . Kidney cancer Neg Hx     ADVANCED DIRECTIVES (Y/N):  N  HEALTH MAINTENANCE: Social History   Tobacco  Use  . Smoking status: Former Research scientist (life sciences)  . Smokeless tobacco: Never Used  Substance Use Topics  . Alcohol use: No  . Drug use: No     Colonoscopy:  PAP:  Bone density:  Lipid panel:  Allergies  Allergen Reactions  . Iodine     Seizure  Betadine ok  . Penicillins Other (See Comments)    Has patient had a PCN reaction causing immediate rash, facial/tongue/throat swelling, SOB or lightheadedness with hypotension: Unknown Has patient had a PCN reaction causing severe rash involving mucus membranes or skin necrosis: Unknown Has patient had a PCN reaction that required hospitalization: Unknown Has patient had a PCN reaction occurring within the last 10 years: Unknown If all of the above answers are "NO", then may proceed with Cephalosporin use.     Current Outpatient Medications  Medication Sig Dispense Refill  . losartan (COZAAR) 25 MG tablet Take 25 mg by mouth daily.    . meclizine (ANTIVERT) 25 MG tablet TK 1 T PO TID  1  . metoprolol succinate (TOPROL-XL) 25 MG 24 hr tablet Take 25 mg by mouth daily.    . pantoprazole (PROTONIX) 40 MG tablet Take 40 mg by mouth daily.    Marland Kitchen rOPINIRole (REQUIP) 0.25 MG tablet Take 1 tablet by mouth 1 day or 1 dose.    . simvastatin (ZOCOR) 20 MG tablet Take 16m by mouth once daily  1  . traZODone (DESYREL) 50 MG tablet Take 1 tablet by mouth at bedtime as needed.     No current facility-administered medications for this visit.     OBJECTIVE: Vitals:   11/09/18 1124  BP: (!) 155/89  Pulse: 68  Temp: (!) 95.7 F (35.4 C)     Body mass index is 30.66 kg/m.    ECOG FS:0 - Asymptomatic  General: Well-developed, well-nourished, no acute distress. Eyes: Pink conjunctiva, anicteric sclera. HEENT: Normocephalic, moist mucous membrane. Lungs: Clear to auscultation bilaterally. Heart: Regular rate and rhythm. No rubs, murmurs, or gallops. Abdomen: Soft, nontender, nondistended. No organomegaly noted, normoactive bowel sounds. Musculoskeletal:  No edema, cyanosis, or clubbing. Neuro: Alert, answering all questions appropriately. Cranial nerves grossly intact. Skin: No rashes or petechiae noted. Psych: Normal affect.  LAB RESULTS:  Lab Results  Component Value Date   NA 129 (L) 11/02/2018   K 5.0 11/02/2018   CL 99 11/02/2018   CO2 23 11/02/2018   GLUCOSE 98 11/02/2018   BUN 32 (H) 11/02/2018   CREATININE 1.49 (H) 11/02/2018   CALCIUM 9.2 11/02/2018   PROT 8.8 (H) 11/02/2018   ALBUMIN 3.9 11/02/2018   AST 34 11/02/2018   ALT 17 11/02/2018   ALKPHOS 99 11/02/2018   BILITOT 0.7 11/02/2018   GFRNONAA 32 (L) 11/02/2018   GFRAA 38 (L) 11/02/2018    Lab Results  Component Value Date   WBC 4.7 11/02/2018   NEUTROABS 2.4 11/02/2018   HGB 11.7 (L) 11/02/2018   HCT 34.0 (L) 11/02/2018   MCV 91.2 11/02/2018   PLT 202 11/02/2018   Lab Results  Component Value Date   TOTALPROTELP 8.4 11/02/2018   ALBUMINELP 3.7 04/14/2017  A1GS 0.2 04/14/2017   A2GS 0.8 04/14/2017   BETS 1.1 04/14/2017   GAMS 2.8 (H) 04/14/2017   MSPIKE 2.5 (H) 04/14/2017   SPEI Comment 04/14/2017     STUDIES: No results found.  ASSESSMENT: Smoldering myeloma  PLAN:   1. Smoldering myeloma: Bone marrow biopsy on December 23, 2011 revealed 15% plasma cells with cytogenetics having monosomy 13 in approximately 7%. Skeletal survey on February 20, 2016 was negative for any lesions. Patient's M spike continues to be stable ranging from approximately 2.0-2.5.  Today's result is 2.1.  Her IgG levels remain significantly elevated greater than 3000, today's result is 3338.  IgM and IgA continue to be within normal limits.  Kappa free light chains have slightly decreased and are now 162.7 with a kappa/lambda light chain ratio of 14.93.  She continues to have a mild renal insufficiency, but no other evidence of endorgan damage. No intervention is needed at this time. Patient does not require repeat bone marrow biopsy.  Return to clinic in 6 months with  repeat laboratory work and video enabled telemedicine visit. 2. Renal insufficiency: Patient's creatinine is 1.49 which is approximately her baseline.  Continue to monitor closely. 3.  Anemia: Chronic and unchanged.  I spent a total of 20 minutes face-to-face with the patient of which greater than 50% of the visit was spent in counseling and coordination of care as detailed above.   Lloyd Huger, MD   11/10/2018 7:18 AM

## 2018-11-06 LAB — MULTIPLE MYELOMA PANEL, SERUM
Albumin SerPl Elph-Mcnc: 3.8 g/dL (ref 2.9–4.4)
Albumin/Glob SerPl: 0.9 (ref 0.7–1.7)
Alpha 1: 0.2 g/dL (ref 0.0–0.4)
Alpha2 Glob SerPl Elph-Mcnc: 0.7 g/dL (ref 0.4–1.0)
B-Globulin SerPl Elph-Mcnc: 0.9 g/dL (ref 0.7–1.3)
Gamma Glob SerPl Elph-Mcnc: 2.8 g/dL — ABNORMAL HIGH (ref 0.4–1.8)
Globulin, Total: 4.6 g/dL — ABNORMAL HIGH (ref 2.2–3.9)
IgA: 84 mg/dL (ref 64–422)
IgG (Immunoglobin G), Serum: 3338 mg/dL — ABNORMAL HIGH (ref 586–1602)
IgM (Immunoglobulin M), Srm: 120 mg/dL (ref 26–217)
M Protein SerPl Elph-Mcnc: 2.1 g/dL — ABNORMAL HIGH
Total Protein ELP: 8.4 g/dL (ref 6.0–8.5)

## 2018-11-09 ENCOUNTER — Inpatient Hospital Stay: Payer: Medicare Other | Attending: Oncology | Admitting: Oncology

## 2018-11-09 ENCOUNTER — Encounter: Payer: Self-pay | Admitting: Oncology

## 2018-11-09 ENCOUNTER — Other Ambulatory Visit: Payer: Self-pay

## 2018-11-09 VITALS — BP 155/89 | HR 68 | Temp 95.7°F | Wt 157.0 lb

## 2018-11-09 DIAGNOSIS — R42 Dizziness and giddiness: Secondary | ICD-10-CM | POA: Diagnosis not present

## 2018-11-09 DIAGNOSIS — Z87891 Personal history of nicotine dependence: Secondary | ICD-10-CM | POA: Diagnosis not present

## 2018-11-09 DIAGNOSIS — N189 Chronic kidney disease, unspecified: Secondary | ICD-10-CM | POA: Diagnosis not present

## 2018-11-09 DIAGNOSIS — Z8551 Personal history of malignant neoplasm of bladder: Secondary | ICD-10-CM | POA: Diagnosis not present

## 2018-11-09 DIAGNOSIS — D649 Anemia, unspecified: Secondary | ICD-10-CM | POA: Diagnosis not present

## 2018-11-09 DIAGNOSIS — M129 Arthropathy, unspecified: Secondary | ICD-10-CM | POA: Insufficient documentation

## 2018-11-09 DIAGNOSIS — Z800 Family history of malignant neoplasm of digestive organs: Secondary | ICD-10-CM | POA: Insufficient documentation

## 2018-11-09 DIAGNOSIS — I129 Hypertensive chronic kidney disease with stage 1 through stage 4 chronic kidney disease, or unspecified chronic kidney disease: Secondary | ICD-10-CM | POA: Insufficient documentation

## 2018-11-09 DIAGNOSIS — Z8673 Personal history of transient ischemic attack (TIA), and cerebral infarction without residual deficits: Secondary | ICD-10-CM | POA: Diagnosis not present

## 2018-11-09 DIAGNOSIS — D472 Monoclonal gammopathy: Secondary | ICD-10-CM

## 2018-11-09 DIAGNOSIS — Z79899 Other long term (current) drug therapy: Secondary | ICD-10-CM | POA: Insufficient documentation

## 2018-11-09 DIAGNOSIS — Z803 Family history of malignant neoplasm of breast: Secondary | ICD-10-CM | POA: Diagnosis not present

## 2018-11-09 DIAGNOSIS — C9000 Multiple myeloma not having achieved remission: Secondary | ICD-10-CM | POA: Insufficient documentation

## 2018-11-09 DIAGNOSIS — K219 Gastro-esophageal reflux disease without esophagitis: Secondary | ICD-10-CM | POA: Insufficient documentation

## 2018-11-09 NOTE — Progress Notes (Signed)
Patient stated that she had been doing well with no complaints. 

## 2018-11-20 ENCOUNTER — Other Ambulatory Visit: Payer: Self-pay | Admitting: Internal Medicine

## 2018-11-20 DIAGNOSIS — Z1231 Encounter for screening mammogram for malignant neoplasm of breast: Secondary | ICD-10-CM

## 2018-12-04 ENCOUNTER — Ambulatory Visit (INDEPENDENT_AMBULATORY_CARE_PROVIDER_SITE_OTHER): Payer: Medicare Other | Admitting: Physician Assistant

## 2018-12-04 ENCOUNTER — Encounter: Payer: Self-pay | Admitting: Physician Assistant

## 2018-12-04 ENCOUNTER — Other Ambulatory Visit: Payer: Self-pay

## 2018-12-04 VITALS — BP 103/70 | HR 76 | Ht 60.0 in | Wt 150.0 lb

## 2018-12-04 DIAGNOSIS — R300 Dysuria: Secondary | ICD-10-CM

## 2018-12-04 DIAGNOSIS — N3000 Acute cystitis without hematuria: Secondary | ICD-10-CM | POA: Diagnosis not present

## 2018-12-04 LAB — MICROSCOPIC EXAMINATION

## 2018-12-04 LAB — URINALYSIS, COMPLETE
Bilirubin, UA: NEGATIVE
Glucose, UA: NEGATIVE
Ketones, UA: NEGATIVE
Nitrite, UA: POSITIVE — AB
Protein,UA: NEGATIVE
Specific Gravity, UA: 1.015 (ref 1.005–1.030)
Urobilinogen, Ur: 0.2 mg/dL (ref 0.2–1.0)
pH, UA: 5.5 (ref 5.0–7.5)

## 2018-12-04 MED ORDER — CIPROFLOXACIN HCL 500 MG PO TABS: 500.0000 mg | ORAL_TABLET | Freq: Two times a day (BID) | ORAL | 0 refills | Status: AC

## 2018-12-04 NOTE — Progress Notes (Signed)
12/04/2018 2:52 PM   Julie Khan [birth year] [id number]  CC: Dysuria, cloudy urine  HPI: Julie Khan is a 84 y.o. female who presents today for evaluation of possible UTI. She is an established [facility] patient who last saw Dr. Erlene Quan on 12/27/2017 for transient bladder pressure with a history of bladder cancer.  Last cystoscopy 09/13/2017 without evidence of disease, no longer undergoing active surveillance.  She reports a 2-week history of dysuria and cloudy urine. She denies frequency, urgency, and gross hematuria. She has not taken anything at home for symptom palliation.  She does not have a history of recurrent UTI.  Last UTI on 05/23/2018 with pan-sensitive E. coli and treated with Cipro.  In-office UA today positive for trace-intact blood, nitrites, and 1+ leukocyte esterase; urine microscopy with 11-30 WBCs/HPF and many bacteria.  She is allergic to penicillins.  PMH: Past Medical History:  Diagnosis Date  . Arthritis   . Bladder cancer ([place])   . Bladder cancer ([place])   . Cancer ([place])    multiple myeloma  . Chronic kidney disease   . Chronic kidney disease   . Dizziness   . GERD (gastroesophageal reflux disease)   . Hypertension   . Multiple myeloma ([place])   . Stroke ([place])    tia x 2  . Tubular adenoma of colon     Surgical History: Past Surgical History:  Procedure Laterality Date  . bladder tumor removed    . BREAST EXCISIONAL BIOPSY Bilateral 1970   Benign  . BREAST SURGERY     bx  . CATARACT EXTRACTION W/PHACO Left 12/16/2016   Procedure: CATARACT EXTRACTION PHACO AND INTRAOCULAR LENS PLACEMENT (IOC);  Surgeon: Eulogio Bear, MD;  Location: [facility] ORS;  Service: Ophthalmology;  Laterality: Left;  [place] 00:38.0 AP% 14.6 CDE 5.54 Fluid pack lot # [id number]  . CATARACT EXTRACTION W/PHACO Right 03/03/2017   Procedure: CATARACT EXTRACTION PHACO AND INTRAOCULAR LENS PLACEMENT ([place]);  Surgeon: Eulogio Bear, MD;  Location: [facility] ORS;  Service:  Ophthalmology;  Laterality: Right;  Lot # [id number] H [place]: 00:29.8 AP%: 10.1 CDE: 3.01  . COLONOSCOPY WITH PROPOFOL N/A 07/25/2015   Procedure: COLONOSCOPY WITH PROPOFOL;  Surgeon: Josefine Class, MD;  Location: [facility] ENDOSCOPY;  Service: Endoscopy;  Laterality: N/A;  . COLONOSCOPY WITH PROPOFOL N/A 06/29/2017   Procedure: COLONOSCOPY WITH PROPOFOL;  Surgeon: Toledo, Benay Pike, MD;  Location: [facility] ENDOSCOPY;  Service: Gastroenterology;  Laterality: N/A;  . ESOPHAGOGASTRODUODENOSCOPY (EGD) WITH PROPOFOL N/A 07/25/2015   Procedure: ESOPHAGOGASTRODUODENOSCOPY (EGD) WITH PROPOFOL;  Surgeon: Josefine Class, MD;  Location[facility]OSCOPY;  Service: Endoscopy;  Laterality: N/A;  . ESOPHAGOGASTRODUODENOSCOPY (EGD) WITH PROPOFOL N/A 06/29/2017   Procedure: ESOPHAGOGASTRODUODENOSCOPY (EGD) WITH PROPOFOL;  Surgeon: Toledo, Benay Pike, MD;  Location: [facility] ENDOSCOPY;  Service: Gastroenterology;  Laterality: N/A;  . HERNIA REPAIR      Home Medications:  Allergies as of 12/04/2018      Reactions   Iodine    Seizure Betadine ok   Penicillins Other (See Comments)   Has patient had a PCN reaction causing immediate rash, facial/tongue/throat swelling, SOB or lightheadedness with hypotension: Unknown Has patient had a PCN reaction causing severe rash involving mucus membranes or skin necrosis: Unknown Has patient had a PCN reaction that required hospitalization: Unknown Has patient had a PCN reaction occurring within the last 10 years: Unknown If all of the above answers are "NO", then may proceed with Cephalosporin use.      Medication List  Accurate as of December 04, 2018 11:59 PM. If you have any questions, ask your nurse or doctor.        ciprofloxacin 500 MG tablet Commonly known as: Cipro Take 1 tablet (500 mg total) by mouth 2 (two) times daily for 5 days. Started by: Debroah Loop, PA-C   losartan 25 MG tablet Commonly known as: COZAAR Take 25 mg by mouth daily.    meclizine 25 MG tablet Commonly known as: ANTIVERT TK 1 T PO TID   metoprolol succinate 25 MG 24 hr tablet Commonly known as: TOPROL-XL Take 25 mg by mouth daily.   pantoprazole 40 MG tablet Commonly known as: PROTONIX Take 40 mg by mouth daily.   rOPINIRole 0.25 MG tablet Commonly known as: REQUIP Take 1 tablet by mouth 1 day or 1 dose.   simvastatin 20 MG tablet Commonly known as: ZOCOR Take 70m by mouth once daily   traZODone 50 MG tablet Commonly known as: DESYREL Take 1 tablet by mouth at bedtime as needed.       Allergies:  Allergies  Allergen Reactions  . Iodine     Seizure  Betadine ok  . Penicillins Other (See Comments)    Has patient had a PCN reaction causing immediate rash, facial/tongue/throat swelling, SOB or lightheadedness with hypotension: Unknown Has patient had a PCN reaction causing severe rash involving mucus membranes or skin necrosis: Unknown Has patient had a PCN reaction that required hospitalization: Unknown Has patient had a PCN reaction occurring within the last 10 years: Unknown If all of the above answers are "NO", then may proceed with Cephalosporin use.     Family History: Family History  Problem Relation Age of Onset  . Cancer Mother   . Colon cancer Mother   . Colon polyps Mother   . Cancer Sister   . Breast cancer Sister 478 . Cancer Brother   . Bladder Cancer Neg Hx   . Kidney cancer Neg Hx     Social History:   reports that she has quit smoking. She has never used smokeless tobacco. She reports that she does not drink alcohol or use drugs.  ROS: UROLOGY Frequent Urination?: No Hard to postpone urination?: No Burning/pain with urination?: Yes Get up at night to urinate?: Yes Leakage of urine?: No Urine stream starts and stops?: No Trouble starting stream?: No Do you have to strain to urinate?: No Blood in urine?: No Urinary tract infection?: Yes Sexually transmitted disease?: No Injury to kidneys or  bladder?: No Painful intercourse?: No Weak stream?: No Currently pregnant?: No Vaginal bleeding?: No Last menstrual period?: n  Gastrointestinal Nausea?: No Vomiting?: No Indigestion/heartburn?: No Diarrhea?: No Constipation?: No  Constitutional Fever: No Night sweats?: No Weight loss?: No Fatigue?: Yes  Skin Skin rash/lesions?: No Itching?: No  Eyes Blurred vision?: No Double vision?: No  Ears/Nose/Throat Sore throat?: No Sinus problems?: No  Hematologic/Lymphatic Swollen glands?: No Easy bruising?: No  Cardiovascular Leg swelling?: No Chest pain?: No  Respiratory Cough?: No Shortness of breath?: No  Endocrine Excessive thirst?: No  Musculoskeletal Back pain?: Yes Joint pain?: Yes  Neurological Headaches?: No Dizziness?: No  Psychologic Depression?: No Anxiety?: No  Physical Exam: BP 103/70   Pulse 76   Ht 5' (1.524 m)   Wt 150 lb (68 kg)   BMI 29.29 kg/m   Constitutional:  Alert and oriented, no acute distress, nontoxic appearing HEENT: [place], AT Cardiovascular: No clubbing, cyanosis, or edema Respiratory: Normal respiratory effort, no increased work of breathing  Skin: No rashes, bruises or suspicious lesions Neurologic: Grossly intact, no focal deficits, moving all 4 extremities Psychiatric: Normal mood and affect  Laboratory Data: Results for orders placed or performed in visit on 12/04/18  Microscopic Examination   URINE  Result Value Ref Range   WBC, UA 11-30 (A) 0 - 5 /hpf   RBC 0-2 0 - 2 /hpf   Epithelial Cells (non renal) 0-10 0 - 10 /hpf   Renal Epithel, UA 0-10 (A) None seen /hpf   Bacteria, UA Many (A) None seen/Few  Urinalysis, Complete  Result Value Ref Range   Specific Gravity, UA 1.015 1.005 - 1.030   pH, UA 5.5 5.0 - 7.5   Color, UA Yellow Yellow   Appearance Ur Cloudy (A) Clear   Leukocytes,UA 1+ (A) Negative   Protein,UA Negative Negative/Trace   Glucose, UA Negative Negative   Ketones, UA Negative Negative    RBC, UA Trace (A) Negative   Bilirubin, UA Negative Negative   Urobilinogen, Ur 0.2 0.2 - 1.0 mg/dL   Nitrite, UA Positive (A) Negative   Microscopic Examination See below:    Assessment & Plan:   1. Acute cystitis, uncomplicated Patient with dysuria and grossly infected urine per UA today.  Will start treatment today.  Prescription sent to pharmacy, urine culture pending.  I counseled patient that I would be in touch with her with the results of her urine culture if they indicated a change in pharmacotherapy.  She expressed understanding of this plan. - Urinalysis, Complete -Urine culture -Ciprofloxacin 500 mg BID x5 days  Debroah Loop, [facility]  [place] [address] [place], [place] [address] [phone]

## 2018-12-08 LAB — CULTURE, URINE COMPREHENSIVE

## 2018-12-29 ENCOUNTER — Ambulatory Visit
Admission: RE | Admit: 2018-12-29 | Discharge: 2018-12-29 | Disposition: A | Discharge status: Home / Self Care | Payer: Medicare Other | Source: Ambulatory Visit | Attending: Internal Medicine | Admitting: Internal Medicine

## 2018-12-29 DIAGNOSIS — Z1231 Encounter for screening mammogram for malignant neoplasm of breast: Secondary | ICD-10-CM

## 2019-03-06 DIAGNOSIS — N1832 Chronic kidney disease, stage 3b: Secondary | ICD-10-CM | POA: Insufficient documentation

## 2019-03-06 DIAGNOSIS — N183 Chronic kidney disease, stage 3 unspecified: Secondary | ICD-10-CM | POA: Insufficient documentation

## 2019-03-06 DIAGNOSIS — I10 Essential (primary) hypertension: Secondary | ICD-10-CM | POA: Insufficient documentation

## 2019-03-21 DIAGNOSIS — K4090 Unilateral inguinal hernia, without obstruction or gangrene, not specified as recurrent: Secondary | ICD-10-CM | POA: Insufficient documentation

## 2019-05-07 ENCOUNTER — Inpatient Hospital Stay: Payer: Medicare Other | Attending: Oncology

## 2019-05-07 DIAGNOSIS — Z8551 Personal history of malignant neoplasm of bladder: Secondary | ICD-10-CM | POA: Insufficient documentation

## 2019-05-07 DIAGNOSIS — Z8673 Personal history of transient ischemic attack (TIA), and cerebral infarction without residual deficits: Secondary | ICD-10-CM | POA: Insufficient documentation

## 2019-05-07 DIAGNOSIS — R944 Abnormal results of kidney function studies: Secondary | ICD-10-CM | POA: Insufficient documentation

## 2019-05-07 DIAGNOSIS — K219 Gastro-esophageal reflux disease without esophagitis: Secondary | ICD-10-CM | POA: Insufficient documentation

## 2019-05-07 DIAGNOSIS — Z87891 Personal history of nicotine dependence: Secondary | ICD-10-CM | POA: Insufficient documentation

## 2019-05-07 DIAGNOSIS — Z809 Family history of malignant neoplasm, unspecified: Secondary | ICD-10-CM | POA: Insufficient documentation

## 2019-05-07 DIAGNOSIS — N189 Chronic kidney disease, unspecified: Secondary | ICD-10-CM | POA: Insufficient documentation

## 2019-05-07 DIAGNOSIS — Z79899 Other long term (current) drug therapy: Secondary | ICD-10-CM | POA: Insufficient documentation

## 2019-05-07 DIAGNOSIS — I129 Hypertensive chronic kidney disease with stage 1 through stage 4 chronic kidney disease, or unspecified chronic kidney disease: Secondary | ICD-10-CM | POA: Insufficient documentation

## 2019-05-07 DIAGNOSIS — M129 Arthropathy, unspecified: Secondary | ICD-10-CM | POA: Insufficient documentation

## 2019-05-07 DIAGNOSIS — C9000 Multiple myeloma not having achieved remission: Secondary | ICD-10-CM | POA: Insufficient documentation

## 2019-05-11 ENCOUNTER — Telehealth: Payer: Self-pay | Admitting: *Deleted

## 2019-05-11 ENCOUNTER — Other Ambulatory Visit: Payer: Self-pay | Admitting: Emergency Medicine

## 2019-05-11 ENCOUNTER — Telehealth: Payer: Self-pay | Admitting: Emergency Medicine

## 2019-05-11 DIAGNOSIS — D472 Monoclonal gammopathy: Secondary | ICD-10-CM

## 2019-05-11 DIAGNOSIS — C9000 Multiple myeloma not having achieved remission: Secondary | ICD-10-CM

## 2019-05-11 NOTE — Telephone Encounter (Signed)
Patient called trying to reschedule lab appointment and was told that she had to speak with Judson Roch Dr Virgel Manifold nurse. The patient missed her lab appointment 2/1 due to having heart tests done and she has a follow up [place] visit 2/8 for lab results. Please return her call [phone]

## 2019-05-11 NOTE — Telephone Encounter (Signed)
Thanks I called her

## 2019-05-11 NOTE — Telephone Encounter (Signed)
Called patient to get clarification on why she needed to talk to me about rescheduling appts. Pt reports that she had a NM study done and won't be able to have labs drawn until next Monday 2/8, and was told that she needs to follow up a week after lab draw. Will send schedule message.

## 2019-05-11 NOTE — Progress Notes (Deleted)
[place]  Telephone:([phone] [phone]  ID: Julie Khan OB: 07/30/1934  MR#: [id number]  AOZ#:[id number]  Patient Care Team: Jodi Marble, MD as PCP - General (Internal Medicine)  I connected with Julie Khan on 05/11/19 at  2:00 PM EST by {Blank single:19197::"video enabled telemedicine visit","telephone visit"} and verified that I am speaking with the correct person using two identifiers.   I discussed the limitations, risks, security and privacy concerns of performing an evaluation and management service by telemedicine and the availability of in-person appointments. I also discussed with the patient that there may be a patient responsible charge related to this service. The patient expressed understanding and agreed to proceed.   Other persons participating in the visit and their role in the encounter: Patient, MD.  Patient's location: Home. Provider's location: Clinic.  CHIEF COMPLAINT: Smoldering myeloma  INTERVAL HISTORY: Patient returns to clinic today for further evaluation and discussion of her laboratory work.  She continues to feel well and remains asymptomatic. She has no neurologic complaints.  She denies any bony pain.  She denies any recent fevers or illnesses.  She has a good appetite and denies weight loss.  She denies any chest pain, shortness of breath, cough, or hemoptysis.  She denies any nausea, vomiting, constipation, or diarrhea.  She has no urinary complaints.  Patient feels at her baseline offers no specific complaints today.  REVIEW OF SYSTEMS:   Review of Systems  Constitutional: Negative.  Negative for fever, malaise/fatigue and weight loss.  Respiratory: Negative.  Negative for cough and shortness of breath.   Cardiovascular: Negative.  Negative for chest pain and leg swelling.  Gastrointestinal: Negative.  Negative for abdominal pain, blood in stool and melena.  Genitourinary: Negative.  Negative for  dysuria.  Musculoskeletal: Negative.  Negative for back pain.  Skin: Negative.  Negative for rash.  Neurological: Negative.  Negative for sensory change, focal weakness and weakness.  Psychiatric/Behavioral: Negative.  The patient is not nervous/anxious.     As per HPI. Otherwise, a complete review of systems is negative.  PAST MEDICAL HISTORY: Past Medical History:  Diagnosis Date  . Arthritis   . Bladder cancer ([place])   . Bladder cancer ([place])   . Cancer ([place])    multiple myeloma  . Chronic kidney disease   . Chronic kidney disease   . Dizziness   . GERD (gastroesophageal reflux disease)   . Hypertension   . Multiple myeloma ([place])   . Stroke ([place])    tia x 2  . Tubular adenoma of colon     PAST SURGICAL HISTORY: Past Surgical History:  Procedure Laterality Date  . bladder tumor removed    . BREAST EXCISIONAL BIOPSY Bilateral 1970   Benign  . BREAST SURGERY     bx  . CATARACT EXTRACTION W/PHACO Left 12/16/2016   Procedure: CATARACT EXTRACTION PHACO AND INTRAOCULAR LENS PLACEMENT (IOC);  Surgeon: Eulogio Bear, MD;  Location: [facility] ORS;  Service: Ophthalmology;  Laterality: Left;  [place] 00:38.0 AP% 14.6 CDE 5.54 Fluid pack lot # [id number]  . CATARACT EXTRACTION W/PHACO Right 03/03/2017   Procedure: CATARACT EXTRACTION PHACO AND INTRAOCULAR LENS PLACEMENT ([place]);  Surgeon: Eulogio Bear, MD;  Location: [facility] ORS;  Service: Ophthalmology;  Laterality: Right;  Lot # [id number] H [place]: 00:29.8 AP%: 10.1 CDE: 3.01  . COLONOSCOPY WITH PROPOFOL N/A 07/25/2015   Procedure: COLONOSCOPY WITH PROPOFOL;  Surgeon: Josefine Class, MD;  Location: [facility] ENDOSCOPY;  Service: Endoscopy;  Laterality: N/A;  .  COLONOSCOPY WITH PROPOFOL N/A 06/29/2017   Procedure: COLONOSCOPY WITH PROPOFOL;  Surgeon: Toledo, Benay Pike, MD;  Location: [facility] ENDOSCOPY;  Service: Gastroenterology;  Laterality: N/A;  . ESOPHAGOGASTRODUODENOSCOPY (EGD) WITH PROPOFOL N/A 07/25/2015   Procedure:  ESOPHAGOGASTRODUODENOSCOPY (EGD) WITH PROPOFOL;  Surgeon: Josefine Class, MD;  Location[facility] ENDOSCOPY;  Service: Endoscopy;  Laterality: N/A;  . ESOPHAGOGASTRODUODENOSCOPY (EGD) WITH PROPOFOL N/A 06/29/2017   Procedure: ESOPHAGOGASTRODUODENOSCOPY (EGD) WITH PROPOFOL;  Surgeon: Toledo, Benay Pike, MD;  Location: [facility] ENDOSCOPY;  Service: Gastroenterology;  Laterality: N/A;  . HERNIA REPAIR      FAMILY HISTORY: Family History  Problem Relation Age of Onset  . Cancer Mother   . Colon cancer Mother   . Colon polyps Mother   . Cancer Sister   . Breast cancer Sister 45  . Cancer Brother   . Bladder Cancer Neg Hx   . Kidney cancer Neg Hx     ADVANCED DIRECTIVES (Y/N):  N  HEALTH MAINTENANCE: Social History   Tobacco Use  . Smoking status: Former Research scientist (life sciences)  . Smokeless tobacco: Never Used  Substance Use Topics  . Alcohol use: No  . Drug use: No     Colonoscopy:  PAP:  Bone density:  Lipid panel:  Allergies  Allergen Reactions  . Iodine     Seizure  Betadine ok  . Penicillins Other (See Comments)    Has patient had a PCN reaction causing immediate rash, facial/tongue/throat swelling, SOB or lightheadedness with hypotension: Unknown Has patient had a PCN reaction causing severe rash involving mucus membranes or skin necrosis: Unknown Has patient had a PCN reaction that required hospitalization: Unknown Has patient had a PCN reaction occurring within the last 10 years: Unknown If all of the above answers are "NO", then may proceed with Cephalosporin use.     Current Outpatient Medications  Medication Sig Dispense Refill  . losartan (COZAAR) 25 MG tablet Take 25 mg by mouth daily.    . meclizine (ANTIVERT) 25 MG tablet TK 1 T PO TID  1  . metoprolol succinate (TOPROL-XL) 25 MG 24 hr tablet Take 25 mg by mouth daily.    . pantoprazole (PROTONIX) 40 MG tablet Take 40 mg by mouth daily.    Marland Kitchen rOPINIRole (REQUIP) 0.25 MG tablet Take 1 tablet by mouth 1 day or 1 dose.    .  simvastatin (ZOCOR) 20 MG tablet Take 11m by mouth once daily  1  . traZODone (DESYREL) 50 MG tablet Take 1 tablet by mouth at bedtime as needed.     No current facility-administered medications for this visit.    OBJECTIVE: There were no vitals filed for this visit.   There is no height or weight on file to calculate BMI.    ECOG FS:0 - Asymptomatic  General: Well-developed, well-nourished, no acute distress. Eyes: Pink conjunctiva, anicteric sclera. HEENT: Normocephalic, moist mucous membrane. Lungs: Clear to auscultation bilaterally. Heart: Regular rate and rhythm. No rubs, murmurs, or gallops. Abdomen: Soft, nontender, nondistended. No organomegaly noted, normoactive bowel sounds. Musculoskeletal: No edema, cyanosis, or clubbing. Neuro: Alert, answering all questions appropriately. Cranial nerves grossly intact. Skin: No rashes or petechiae noted. Psych: Normal affect.  LAB RESULTS:  Lab Results  Component Value Date   NA 129 (L) 11/02/2018   K 5.0 11/02/2018   CL 99 11/02/2018   CO2 23 11/02/2018   GLUCOSE 98 11/02/2018   BUN 32 (H) 11/02/2018   CREATININE 1.49 (H) 11/02/2018   CALCIUM 9.2 11/02/2018   PROT 8.8 (H)  11/02/2018   ALBUMIN 3.9 11/02/2018   AST 34 11/02/2018   ALT 17 11/02/2018   ALKPHOS 99 11/02/2018   BILITOT 0.7 11/02/2018   GFRNONAA 32 (L) 11/02/2018   GFRAA 38 (L) 11/02/2018    Lab Results  Component Value Date   WBC 4.7 11/02/2018   NEUTROABS 2.4 11/02/2018   HGB 11.7 (L) 11/02/2018   HCT 34.0 (L) 11/02/2018   MCV 91.2 11/02/2018   PLT 202 11/02/2018   Lab Results  Component Value Date   TOTALPROTELP 8.4 11/02/2018   ALBUMINELP 3.7 04/14/2017   A1GS 0.2 04/14/2017   A2GS 0.8 04/14/2017   BETS 1.1 04/14/2017   GAMS 2.8 (H) 04/14/2017   MSPIKE 2.5 (H) 04/14/2017   SPEI Comment 04/14/2017     STUDIES: No results found.  ASSESSMENT: Smoldering myeloma  PLAN:   1. Smoldering myeloma: Bone marrow biopsy on December 23, 2011  revealed 15% plasma cells with cytogenetics having monosomy 13 in approximately 7%. Skeletal survey on February 20, 2016 was negative for any lesions. Patient's M spike continues to be stable ranging from approximately 2.0-2.5.  Today's result is 2.1.  Her IgG levels remain significantly elevated greater than 3000, today's result is 3338.  IgM and IgA continue to be within normal limits.  Kappa free light chains have slightly decreased and are now 162.7 with a kappa/lambda light chain ratio of 14.93.  She continues to have a mild renal insufficiency, but no other evidence of endorgan damage. No intervention is needed at this time. Patient does not require repeat bone marrow biopsy.  Return to clinic in 6 months with repeat laboratory work and video enabled telemedicine visit. 2. Renal insufficiency: Patient's creatinine is 1.49 which is approximately her baseline.  Continue to monitor closely. 3.  Anemia: Chronic and unchanged.  I provided *** minutes of {Blank single:19197::"face-to-face video visit time","non face-to-face telephone visit time"} during this encounter which included chart review, counseling, and coordination of care as documented above.   Lloyd Huger, MD   05/11/2019 6:09 AM

## 2019-05-14 ENCOUNTER — Other Ambulatory Visit: Payer: Self-pay

## 2019-05-14 ENCOUNTER — Inpatient Hospital Stay: Payer: Medicare Other

## 2019-05-14 ENCOUNTER — Inpatient Hospital Stay: Payer: Medicare Other | Admitting: Oncology

## 2019-05-14 DIAGNOSIS — Z79899 Other long term (current) drug therapy: Secondary | ICD-10-CM | POA: Diagnosis not present

## 2019-05-14 DIAGNOSIS — C9000 Multiple myeloma not having achieved remission: Secondary | ICD-10-CM

## 2019-05-14 DIAGNOSIS — M129 Arthropathy, unspecified: Secondary | ICD-10-CM | POA: Diagnosis not present

## 2019-05-14 DIAGNOSIS — R944 Abnormal results of kidney function studies: Secondary | ICD-10-CM | POA: Diagnosis not present

## 2019-05-14 DIAGNOSIS — Z87891 Personal history of nicotine dependence: Secondary | ICD-10-CM | POA: Diagnosis not present

## 2019-05-14 DIAGNOSIS — N189 Chronic kidney disease, unspecified: Secondary | ICD-10-CM | POA: Diagnosis not present

## 2019-05-14 DIAGNOSIS — K219 Gastro-esophageal reflux disease without esophagitis: Secondary | ICD-10-CM | POA: Diagnosis not present

## 2019-05-14 DIAGNOSIS — I129 Hypertensive chronic kidney disease with stage 1 through stage 4 chronic kidney disease, or unspecified chronic kidney disease: Secondary | ICD-10-CM | POA: Diagnosis not present

## 2019-05-14 DIAGNOSIS — Z8551 Personal history of malignant neoplasm of bladder: Secondary | ICD-10-CM | POA: Diagnosis not present

## 2019-05-14 DIAGNOSIS — Z8673 Personal history of transient ischemic attack (TIA), and cerebral infarction without residual deficits: Secondary | ICD-10-CM | POA: Diagnosis not present

## 2019-05-14 DIAGNOSIS — Z809 Family history of malignant neoplasm, unspecified: Secondary | ICD-10-CM | POA: Diagnosis not present

## 2019-05-14 LAB — COMPREHENSIVE METABOLIC PANEL
ALT: 22 U/L (ref 0–44)
AST: 38 U/L (ref 15–41)
Albumin: 3.9 g/dL (ref 3.5–5.0)
Alkaline Phosphatase: 114 U/L (ref 38–126)
Anion gap: 10 (ref 5–15)
BUN: 32 mg/dL — ABNORMAL HIGH (ref 8–23)
CO2: 22 mmol/L (ref 22–32)
Calcium: 9.1 mg/dL (ref 8.9–10.3)
Chloride: 99 mmol/L (ref 98–111)
Creatinine, Ser: 1.82 mg/dL — ABNORMAL HIGH (ref 0.44–1.00)
GFR calc Af Amer: 29 mL/min — ABNORMAL LOW (ref >60)
GFR calc non Af Amer: 25 mL/min — ABNORMAL LOW (ref >60)
Glucose, Bld: 92 mg/dL (ref 70–99)
Potassium: 4.3 mmol/L (ref 3.5–5.1)
Sodium: 131 mmol/L — ABNORMAL LOW (ref 135–145)
Total Bilirubin: 0.7 mg/dL (ref 0.3–1.2)
Total Protein: 9.4 g/dL — ABNORMAL HIGH (ref 6.5–8.1)

## 2019-05-14 LAB — CBC WITH DIFFERENTIAL/PLATELET
Abs Immature Granulocytes: 0.01 10*3/uL (ref 0.00–0.07)
Basophils Absolute: 0 10*3/uL (ref 0.0–0.1)
Basophils Relative: 1 %
Eosinophils Absolute: 0 10*3/uL (ref 0.0–0.5)
Eosinophils Relative: 1 %
HCT: 38.3 % (ref 36.0–46.0)
Hemoglobin: 12.2 g/dL (ref 12.0–15.0)
Immature Granulocytes: 0 %
Lymphocytes Relative: 39 %
Lymphs Abs: 1.6 10*3/uL (ref 0.7–4.0)
MCH: 30 pg (ref 26.0–34.0)
MCHC: 31.9 g/dL (ref 30.0–36.0)
MCV: 94.3 fL (ref 80.0–100.0)
Monocytes Absolute: 0.3 10*3/uL (ref 0.1–1.0)
Monocytes Relative: 8 %
Neutro Abs: 2.1 10*3/uL (ref 1.7–7.7)
Neutrophils Relative %: 51 %
Platelets: 181 10*3/uL (ref 150–400)
RBC: 4.06 MIL/uL (ref 3.87–5.11)
RDW: 12.6 % (ref 11.5–15.5)
WBC: 4 10*3/uL (ref 4.0–10.5)
nRBC: 0 % (ref 0.0–0.2)

## 2019-05-16 LAB — KAPPA/LAMBDA LIGHT CHAINS
Kappa free light chain: 188.2 mg/L — ABNORMAL HIGH (ref 3.3–19.4)
Kappa, lambda light chain ratio: 14.48 — ABNORMAL HIGH (ref 0.26–1.65)
Lambda free light chains: 13 mg/L (ref 5.7–26.3)

## 2019-05-16 LAB — MULTIPLE MYELOMA PANEL, SERUM
Albumin SerPl Elph-Mcnc: 3.7 g/dL (ref 2.9–4.4)
Albumin/Glob SerPl: 0.8 (ref 0.7–1.7)
Alpha 1: 0.2 g/dL (ref 0.0–0.4)
Alpha2 Glob SerPl Elph-Mcnc: 0.7 g/dL (ref 0.4–1.0)
B-Globulin SerPl Elph-Mcnc: 1.1 g/dL (ref 0.7–1.3)
Gamma Glob SerPl Elph-Mcnc: 3.1 g/dL — ABNORMAL HIGH (ref 0.4–1.8)
Globulin, Total: 5.1 g/dL — ABNORMAL HIGH (ref 2.2–3.9)
IgA: 84 mg/dL (ref 64–422)
IgG (Immunoglobin G), Serum: 3724 mg/dL — ABNORMAL HIGH (ref 586–1602)
IgM (Immunoglobulin M), Srm: 133 mg/dL (ref 26–217)
M Protein SerPl Elph-Mcnc: 2.6 g/dL — ABNORMAL HIGH
Total Protein ELP: 8.8 g/dL — ABNORMAL HIGH (ref 6.0–8.5)

## 2019-05-16 LAB — IGG, IGA, IGM
IgA: 81 mg/dL (ref 64–422)
IgG (Immunoglobin G), Serum: 3597 mg/dL — ABNORMAL HIGH (ref 586–1602)
IgM (Immunoglobulin M), Srm: 133 mg/dL (ref 26–217)

## 2019-05-19 NOTE — Progress Notes (Signed)
[place]  [phone] [phone]  ID: Julie Khan OB: 10-30-1934  MR#: [id number]  UXL#:[id number]  Patient Care Team: Jodi Marble, MD as PCP - General (Internal Medicine)   CHIEF COMPLAINT: Smoldering myeloma  INTERVAL HISTORY: Patient returns to clinic today for further evaluation and discussion of her laboratory work.  She continues to feel well and remains asymptomatic. She has no neurologic complaints.  She denies any bony pain.  She denies any recent fevers or illnesses.  She has a good appetite and denies weight loss.  She denies any chest pain, shortness of breath, cough, or hemoptysis.  She denies any nausea, vomiting, constipation, or diarrhea.  She has no urinary complaints.  Patient offers no specific complaints today.  REVIEW OF SYSTEMS:   Review of Systems  Constitutional: Negative.  Negative for fever, malaise/fatigue and weight loss.  Respiratory: Negative.  Negative for cough and shortness of breath.   Cardiovascular: Negative.  Negative for chest pain and leg swelling.  Gastrointestinal: Negative.  Negative for abdominal pain, blood in stool and melena.  Genitourinary: Negative.  Negative for dysuria.  Musculoskeletal: Negative.  Negative for back pain.  Skin: Negative.  Negative for rash.  Neurological: Negative.  Negative for sensory change, focal weakness and weakness.  Psychiatric/Behavioral: Negative.  The patient is not nervous/anxious.     As per HPI. Otherwise, a complete review of systems is negative.  PAST MEDICAL HISTORY: Past Medical History:  Diagnosis Date  . Arthritis   . Bladder cancer ([place])   . Bladder cancer ([place])   . Cancer ([place])    multiple myeloma  . Chronic kidney disease   . Chronic kidney disease   . Dizziness   . GERD (gastroesophageal reflux disease)   . Hypertension   . Multiple myeloma ([place])   . Stroke ([place])    tia x 2  . Tubular adenoma of colon     PAST SURGICAL  HISTORY: Past Surgical History:  Procedure Laterality Date  . bladder tumor removed    . BREAST EXCISIONAL BIOPSY Bilateral 1970   Benign  . BREAST SURGERY     bx  . CATARACT EXTRACTION W/PHACO Left 12/16/2016   Procedure: CATARACT EXTRACTION PHACO AND INTRAOCULAR LENS PLACEMENT (IOC);  Surgeon: Eulogio Bear, MD;  Location: [facility] ORS;  Service: Ophthalmology;  Laterality: Left;  [place] 00:38.0 AP% 14.6 CDE 5.54 Fluid pack lot # [id number]  . CATARACT EXTRACTION W/PHACO Right 03/03/2017   Procedure: CATARACT EXTRACTION PHACO AND INTRAOCULAR LENS PLACEMENT ([place]);  Surgeon: Eulogio Bear, MD;  Location: [facility] ORS;  Service: Ophthalmology;  Laterality: Right;  Lot # [id number] [place]: 00:29.8 AP%: 10.1 CDE: 3.01  . COLONOSCOPY WITH PROPOFOL N/A 07/25/2015   Procedure: COLONOSCOPY WITH PROPOFOL;  Surgeon: Josefine Class, MD;  Location: [facility] ENDOSCOPY;  Service: Endoscopy;  Laterality: N/A;  . COLONOSCOPY WITH PROPOFOL N/A 06/29/2017   Procedure: COLONOSCOPY WITH PROPOFOL;  Surgeon: Toledo, Benay Pike, MD;  Location: [facility] ENDOSCOPY;  Service: Gastroenterology;  Laterality: N/A;  . ESOPHAGOGASTRODUODENOSCOPY (EGD) WITH PROPOFOL N/A 07/25/2015   Procedure: ESOPHAGOGASTRODUODENOSCOPY (EGD) WITH PROPOFOL;  Surgeon: Josefine Class, MD;  Location: [facility] ENDOSCOPY;  Service: Endoscopy;  Laterality: N/A;  . ESOPHAGOGASTRODUODENOSCOPY (EGD) WITH PROPOFOL N/A 06/29/2017   Procedure: ESOPHAGOGASTRODUODENOSCOPY (EGD) WITH PROPOFOL;  Surgeon: Toledo, Benay Pike, MD;  Location: [facility] ENDOSCOPY;  Service: Gastroenterology;  Laterality: N/A;  . HERNIA REPAIR      FAMILY HISTORY: Family History  Problem Relation Age of Onset  . Cancer  Mother   . Colon cancer Mother   . Colon polyps Mother   . Cancer Sister   . Breast cancer Sister 7  . Cancer Brother   . Bladder Cancer Neg Hx   . Kidney cancer Neg Hx     ADVANCED DIRECTIVES (Y/N):  N  HEALTH MAINTENANCE: Social History   Tobacco Use  .  Smoking status: Former Research scientist (life sciences)  . Smokeless tobacco: Never Used  Substance Use Topics  . Alcohol use: No  . Drug use: No     Colonoscopy:  PAP:  Bone density:  Lipid panel:  Allergies  Allergen Reactions  . Iodine     Seizure  Betadine ok Other reaction(s): Other (See Comments) Sneezing Seizure  Betadine ok  . Penicillins Other (See Comments) and Rash    Has patient had a PCN reaction causing immediate rash, facial/tongue/throat swelling, SOB or lightheadedness with hypotension: Unknown Has patient had a PCN reaction causing severe rash involving mucus membranes or skin necrosis: Unknown Has patient had a PCN reaction that required hospitalization: Unknown Has patient had a PCN reaction occurring within the last 10 years: Unknown If all of the above answers are "NO", then may proceed with Cephalosporin use.  Other reaction(s): Other (See Comments) Has patient had a PCN reaction causing immediate rash, facial/tongue/throat swelling, SOB or lightheadedness with hypotension: Unknown Has patient had a PCN reaction causing severe rash involving mucus membranes or skin necrosis: Unknown Has patient had a PCN reaction that required hospitalization: Unknown Has patient had a PCN reaction occurring within the last 10 years: Unknown If all of the above answers are "NO", then may proceed with Cephalosporin use. Has patient had a PCN reaction causing immediate rash, facial/tongue/throat swelling, SOB or lightheadedness with hypotension: Unknown Has patient had a PCN reaction causing severe rash involving mucus membranes or skin necrosis: Unknown Has patient had a PCN reaction that required hospitalization: Unknown Has patient had a PCN reaction occurring within the last 10 years: Unknown If all of the above answers are "NO", then may proceed with Cephalosporin use.    Current Outpatient Medications  Medication Sig Dispense Refill  . isosorbide dinitrate (ISORDIL) 30 MG tablet Take 30  mg by mouth daily.    Marland Kitchen losartan (COZAAR) 25 MG tablet Take 25 mg by mouth daily.    . meclizine (ANTIVERT) 25 MG tablet TK 1 T PO TID  1  . metoprolol succinate (TOPROL-XL) 25 MG 24 hr tablet Take 25 mg by mouth daily.    . nitroGLYCERIN (NITROSTAT) 0.3 MG SL tablet Place 1 tablet under the tongue as needed.    . pantoprazole (PROTONIX) 40 MG tablet Take 40 mg by mouth daily.    Marland Kitchen rOPINIRole (REQUIP) 0.25 MG tablet Take 1 tablet by mouth 1 day or 1 dose.    . traZODone (DESYREL) 50 MG tablet Take 1 tablet by mouth at bedtime as needed.    . rosuvastatin (CRESTOR) 10 MG tablet Take 10 mg by mouth daily.    . simvastatin (ZOCOR) 20 MG tablet Take 55m by mouth once daily  1   No current facility-administered medications for this visit.    OBJECTIVE: Vitals:   05/21/19 1437  BP: (!) 145/78  Pulse: 63  Resp: 16  Temp: (!) 97 F (36.1 C)  SpO2: 98%     Body mass index is 30.35 kg/m.    ECOG FS:0 - Asymptomatic  General: Well-developed, well-nourished, no acute distress. Eyes: Pink conjunctiva, anicteric sclera. HEENT: Normocephalic, moist mucous  membranes. Lungs: No audible wheezing or coughing. Heart: Regular rate and rhythm. Abdomen: Soft, nontender, no obvious distention. Musculoskeletal: No edema, cyanosis, or clubbing. Neuro: Alert, answering all questions appropriately. Cranial nerves grossly intact. Skin: No rashes or petechiae noted. Psych: Normal affect.   LAB RESULTS:  Lab Results  Component Value Date   NA 131 (L) 05/14/2019   K 4.3 05/14/2019   CL 99 05/14/2019   CO2 22 05/14/2019   GLUCOSE 92 05/14/2019   BUN 32 (H) 05/14/2019   CREATININE 1.82 (H) 05/14/2019   CALCIUM 9.1 05/14/2019   PROT 9.4 (H) 05/14/2019   ALBUMIN 3.9 05/14/2019   AST 38 05/14/2019   ALT 22 05/14/2019   ALKPHOS 114 05/14/2019   BILITOT 0.7 05/14/2019   GFRNONAA 25 (L) 05/14/2019   GFRAA 29 (L) 05/14/2019    Lab Results  Component Value Date   WBC 4.0 05/14/2019   NEUTROABS  2.1 05/14/2019   HGB 12.2 05/14/2019   HCT 38.3 05/14/2019   MCV 94.3 05/14/2019   PLT 181 05/14/2019   Lab Results  Component Value Date   TOTALPROTELP 8.8 (H) 05/14/2019   ALBUMINELP 3.7 04/14/2017   A1GS 0.2 04/14/2017   A2GS 0.8 04/14/2017   BETS 1.1 04/14/2017   GAMS 2.8 (H) 04/14/2017   MSPIKE 2.5 (H) 04/14/2017   SPEI Comment 04/14/2017     STUDIES: No results found.  ASSESSMENT: Smoldering myeloma  PLAN:   1. Smoldering myeloma: Bone marrow biopsy on December 23, 2011 revealed 15% plasma cells with cytogenetics having monosomy 13 in approximately 7%. Skeletal survey on February 20, 2016 was negative for any lesions. Patient's M spike continues to be stable ranging from approximately 2.0-2.5.  Today's result is slightly higher at 2.6.  Her IgG levels remain significantly elevated greater than 3000, today's result has trended up slightly to 3724.  IgM and IgA continue to be within normal limits.  Kappa free light chains are essentially stable at 188.2 with a kappa/lambda light chain ratio 14.48.  Her renal insufficiency is slightly worse today at 1.82, but no other evidence of endorgan damage.  Patient does not require repeat bone marrow biopsy at this time, but would consider 1 in the near future if her laboratory work continues to trend up.  Return to clinic in 6 months with repeat laboratory work and further evaluation.  Video visit was offered to patient, but she declined preferring all appointments in person.  2. Renal insufficiency: Patient's creatinine is trended up slightly to 1.82.  Continue to monitor closely. 3.  Anemia: Resolved.    Lloyd Huger, MD   05/22/2019 9:23 AM

## 2019-05-21 ENCOUNTER — Inpatient Hospital Stay (HOSPITAL_BASED_OUTPATIENT_CLINIC_OR_DEPARTMENT_OTHER): Payer: Medicare Other | Admitting: Oncology

## 2019-05-21 ENCOUNTER — Other Ambulatory Visit: Payer: Self-pay

## 2019-05-21 ENCOUNTER — Encounter (INDEPENDENT_AMBULATORY_CARE_PROVIDER_SITE_OTHER): Payer: Self-pay

## 2019-05-21 VITALS — BP 145/78 | HR 63 | Temp 97.0°F | Resp 16 | Wt 155.4 lb

## 2019-05-21 DIAGNOSIS — I248 Other forms of acute ischemic heart disease: Secondary | ICD-10-CM | POA: Diagnosis not present

## 2019-05-21 DIAGNOSIS — C9000 Multiple myeloma not having achieved remission: Secondary | ICD-10-CM | POA: Diagnosis not present

## 2019-05-21 DIAGNOSIS — I214 Non-ST elevation (NSTEMI) myocardial infarction: Secondary | ICD-10-CM | POA: Diagnosis not present

## 2019-05-21 DIAGNOSIS — D472 Monoclonal gammopathy: Secondary | ICD-10-CM

## 2019-05-21 NOTE — Progress Notes (Signed)
Pt reports that she recently had a sharp pain between her breasts and went to see her cardiologist who ran several tests, including a stress test. She was told that he couldn't find out why she had the pain, but gave her a medication that made her feel like she had a mini stroke after only taking one dose and she doesn't want to take it anymore. Pt feels as though the pain is coming from her stomach.

## 2019-05-22 ENCOUNTER — Inpatient Hospital Stay
Admission: EM | Admit: 2019-05-22 | Discharge: 2019-05-24 | DRG: 286 | Disposition: A | Discharge status: Home / Self Care | Payer: Medicare Other | Attending: Internal Medicine | Admitting: Internal Medicine

## 2019-05-22 ENCOUNTER — Encounter: Payer: Self-pay | Admitting: Emergency Medicine

## 2019-05-22 ENCOUNTER — Other Ambulatory Visit: Payer: Self-pay

## 2019-05-22 DIAGNOSIS — Z803 Family history of malignant neoplasm of breast: Secondary | ICD-10-CM

## 2019-05-22 DIAGNOSIS — N184 Chronic kidney disease, stage 4 (severe): Secondary | ICD-10-CM | POA: Diagnosis present

## 2019-05-22 DIAGNOSIS — Z20822 Contact with and (suspected) exposure to covid-19: Secondary | ICD-10-CM | POA: Diagnosis present

## 2019-05-22 DIAGNOSIS — N189 Chronic kidney disease, unspecified: Secondary | ICD-10-CM

## 2019-05-22 DIAGNOSIS — R778 Other specified abnormalities of plasma proteins: Secondary | ICD-10-CM

## 2019-05-22 DIAGNOSIS — K219 Gastro-esophageal reflux disease without esophagitis: Secondary | ICD-10-CM | POA: Diagnosis present

## 2019-05-22 DIAGNOSIS — Z8551 Personal history of malignant neoplasm of bladder: Secondary | ICD-10-CM

## 2019-05-22 DIAGNOSIS — C9000 Multiple myeloma not having achieved remission: Secondary | ICD-10-CM | POA: Diagnosis present

## 2019-05-22 DIAGNOSIS — Z8673 Personal history of transient ischemic attack (TIA), and cerebral infarction without residual deficits: Secondary | ICD-10-CM

## 2019-05-22 DIAGNOSIS — K859 Acute pancreatitis without necrosis or infection, unspecified: Secondary | ICD-10-CM | POA: Diagnosis present

## 2019-05-22 DIAGNOSIS — K5730 Diverticulosis of large intestine without perforation or abscess without bleeding: Secondary | ICD-10-CM | POA: Diagnosis present

## 2019-05-22 DIAGNOSIS — R7989 Other specified abnormal findings of blood chemistry: Secondary | ICD-10-CM | POA: Diagnosis present

## 2019-05-22 DIAGNOSIS — R1013 Epigastric pain: Secondary | ICD-10-CM | POA: Diagnosis present

## 2019-05-22 DIAGNOSIS — R079 Chest pain, unspecified: Secondary | ICD-10-CM

## 2019-05-22 DIAGNOSIS — Z800 Family history of malignant neoplasm of digestive organs: Secondary | ICD-10-CM

## 2019-05-22 DIAGNOSIS — I248 Other forms of acute ischemic heart disease: Principal | ICD-10-CM | POA: Diagnosis present

## 2019-05-22 DIAGNOSIS — Z8371 Family history of colonic polyps: Secondary | ICD-10-CM

## 2019-05-22 DIAGNOSIS — E785 Hyperlipidemia, unspecified: Secondary | ICD-10-CM | POA: Diagnosis present

## 2019-05-22 DIAGNOSIS — I129 Hypertensive chronic kidney disease with stage 1 through stage 4 chronic kidney disease, or unspecified chronic kidney disease: Secondary | ICD-10-CM | POA: Diagnosis present

## 2019-05-22 DIAGNOSIS — Z87891 Personal history of nicotine dependence: Secondary | ICD-10-CM

## 2019-05-22 DIAGNOSIS — N179 Acute kidney failure, unspecified: Secondary | ICD-10-CM

## 2019-05-22 DIAGNOSIS — R112 Nausea with vomiting, unspecified: Secondary | ICD-10-CM

## 2019-05-22 DIAGNOSIS — M1990 Unspecified osteoarthritis, unspecified site: Secondary | ICD-10-CM | POA: Diagnosis present

## 2019-05-22 DIAGNOSIS — Z79899 Other long term (current) drug therapy: Secondary | ICD-10-CM

## 2019-05-22 MED ORDER — ONDANSETRON HCL 4 MG/2ML IJ SOLN
4.0000 mg | Freq: Once | INTRAMUSCULAR | Status: AC
  Administered 2019-05-22: 4 mg via INTRAVENOUS

## 2019-05-22 MED ORDER — ONDANSETRON HCL 4 MG/2ML IJ SOLN
INTRAMUSCULAR | Status: AC
  Filled 2019-05-22: qty 2

## 2019-05-22 NOTE — ED Triage Notes (Signed)
Pt arrived via EMS from home where pt called out for N/V and upper epigastric pain x1 day. Pt denies SOB. Hx/o GERD.

## 2019-05-23 ENCOUNTER — Emergency Department: Payer: Medicare Other

## 2019-05-23 ENCOUNTER — Other Ambulatory Visit: Payer: Self-pay

## 2019-05-23 ENCOUNTER — Ambulatory Visit: Payer: Self-pay | Admitting: Cardiovascular Disease

## 2019-05-23 ENCOUNTER — Encounter: Payer: Self-pay | Admitting: Family Medicine

## 2019-05-23 DIAGNOSIS — R1013 Epigastric pain: Secondary | ICD-10-CM | POA: Diagnosis not present

## 2019-05-23 DIAGNOSIS — C9000 Multiple myeloma not having achieved remission: Secondary | ICD-10-CM | POA: Diagnosis present

## 2019-05-23 DIAGNOSIS — Z8551 Personal history of malignant neoplasm of bladder: Secondary | ICD-10-CM | POA: Diagnosis not present

## 2019-05-23 DIAGNOSIS — I214 Non-ST elevation (NSTEMI) myocardial infarction: Secondary | ICD-10-CM | POA: Diagnosis present

## 2019-05-23 DIAGNOSIS — N184 Chronic kidney disease, stage 4 (severe): Secondary | ICD-10-CM | POA: Diagnosis present

## 2019-05-23 DIAGNOSIS — K859 Acute pancreatitis without necrosis or infection, unspecified: Secondary | ICD-10-CM | POA: Diagnosis present

## 2019-05-23 DIAGNOSIS — M1990 Unspecified osteoarthritis, unspecified site: Secondary | ICD-10-CM | POA: Diagnosis present

## 2019-05-23 DIAGNOSIS — I249 Acute ischemic heart disease, unspecified: Secondary | ICD-10-CM | POA: Diagnosis not present

## 2019-05-23 DIAGNOSIS — I248 Other forms of acute ischemic heart disease: Secondary | ICD-10-CM | POA: Diagnosis present

## 2019-05-23 DIAGNOSIS — Z8371 Family history of colonic polyps: Secondary | ICD-10-CM | POA: Diagnosis not present

## 2019-05-23 DIAGNOSIS — N189 Chronic kidney disease, unspecified: Secondary | ICD-10-CM

## 2019-05-23 DIAGNOSIS — R778 Other specified abnormalities of plasma proteins: Secondary | ICD-10-CM | POA: Diagnosis not present

## 2019-05-23 DIAGNOSIS — Z8673 Personal history of transient ischemic attack (TIA), and cerebral infarction without residual deficits: Secondary | ICD-10-CM | POA: Diagnosis not present

## 2019-05-23 DIAGNOSIS — E785 Hyperlipidemia, unspecified: Secondary | ICD-10-CM

## 2019-05-23 DIAGNOSIS — Z20822 Contact with and (suspected) exposure to covid-19: Secondary | ICD-10-CM | POA: Diagnosis present

## 2019-05-23 DIAGNOSIS — Z800 Family history of malignant neoplasm of digestive organs: Secondary | ICD-10-CM | POA: Diagnosis not present

## 2019-05-23 DIAGNOSIS — N179 Acute kidney failure, unspecified: Secondary | ICD-10-CM

## 2019-05-23 DIAGNOSIS — K5730 Diverticulosis of large intestine without perforation or abscess without bleeding: Secondary | ICD-10-CM | POA: Diagnosis present

## 2019-05-23 DIAGNOSIS — Z87891 Personal history of nicotine dependence: Secondary | ICD-10-CM | POA: Diagnosis not present

## 2019-05-23 DIAGNOSIS — I129 Hypertensive chronic kidney disease with stage 1 through stage 4 chronic kidney disease, or unspecified chronic kidney disease: Secondary | ICD-10-CM | POA: Diagnosis present

## 2019-05-23 DIAGNOSIS — Z803 Family history of malignant neoplasm of breast: Secondary | ICD-10-CM | POA: Diagnosis not present

## 2019-05-23 DIAGNOSIS — K219 Gastro-esophageal reflux disease without esophagitis: Secondary | ICD-10-CM | POA: Diagnosis present

## 2019-05-23 DIAGNOSIS — Z79899 Other long term (current) drug therapy: Secondary | ICD-10-CM | POA: Diagnosis not present

## 2019-05-23 DIAGNOSIS — I219 Acute myocardial infarction, unspecified: Secondary | ICD-10-CM | POA: Insufficient documentation

## 2019-05-23 LAB — CBC WITH DIFFERENTIAL/PLATELET
Abs Immature Granulocytes: 0.02 10*3/uL (ref 0.00–0.07)
Basophils Absolute: 0 10*3/uL (ref 0.0–0.1)
Basophils Relative: 1 %
Eosinophils Absolute: 0.1 10*3/uL (ref 0.0–0.5)
Eosinophils Relative: 2 %
HCT: 36.4 % (ref 36.0–46.0)
Hemoglobin: 12.3 g/dL (ref 12.0–15.0)
Immature Granulocytes: 0 %
Lymphocytes Relative: 51 %
Lymphs Abs: 3.5 10*3/uL (ref 0.7–4.0)
MCH: 31.1 pg (ref 26.0–34.0)
MCHC: 33.8 g/dL (ref 30.0–36.0)
MCV: 91.9 fL (ref 80.0–100.0)
Monocytes Absolute: 0.5 10*3/uL (ref 0.1–1.0)
Monocytes Relative: 8 %
Neutro Abs: 2.6 10*3/uL (ref 1.7–7.7)
Neutrophils Relative %: 38 %
Platelets: 170 10*3/uL (ref 150–400)
RBC: 3.96 MIL/uL (ref 3.87–5.11)
RDW: 12.5 % (ref 11.5–15.5)
WBC: 6.8 10*3/uL (ref 4.0–10.5)
nRBC: 0 % (ref 0.0–0.2)

## 2019-05-23 LAB — COMPREHENSIVE METABOLIC PANEL
ALT: 21 U/L (ref 0–44)
AST: 43 U/L — ABNORMAL HIGH (ref 15–41)
Albumin: 3.7 g/dL (ref 3.5–5.0)
Alkaline Phosphatase: 102 U/L (ref 38–126)
Anion gap: 11 (ref 5–15)
BUN: 39 mg/dL — ABNORMAL HIGH (ref 8–23)
CO2: 22 mmol/L (ref 22–32)
Calcium: 9.3 mg/dL (ref 8.9–10.3)
Chloride: 100 mmol/L (ref 98–111)
Creatinine, Ser: 2.11 mg/dL — ABNORMAL HIGH (ref 0.44–1.00)
GFR calc Af Amer: 25 mL/min — ABNORMAL LOW (ref >60)
GFR calc non Af Amer: 21 mL/min — ABNORMAL LOW (ref >60)
Glucose, Bld: 121 mg/dL — ABNORMAL HIGH (ref 70–99)
Potassium: 4.2 mmol/L (ref 3.5–5.1)
Sodium: 133 mmol/L — ABNORMAL LOW (ref 135–145)
Total Bilirubin: 0.6 mg/dL (ref 0.3–1.2)
Total Protein: 9.1 g/dL — ABNORMAL HIGH (ref 6.5–8.1)

## 2019-05-23 LAB — BASIC METABOLIC PANEL
Anion gap: 4 — ABNORMAL LOW (ref 5–15)
Anion gap: 4 — ABNORMAL LOW (ref 5–15)
BUN: 36 mg/dL — ABNORMAL HIGH (ref 8–23)
BUN: 25 mg/dL — ABNORMAL HIGH (ref 8–23)
CO2: 26 mmol/L (ref 22–32)
CO2: 25 mmol/L (ref 22–32)
Calcium: 8.6 mg/dL — ABNORMAL LOW (ref 8.9–10.3)
Calcium: 8.5 mg/dL — ABNORMAL LOW (ref 8.9–10.3)
Chloride: 104 mmol/L (ref 98–111)
Chloride: 106 mmol/L (ref 98–111)
Creatinine, Ser: 1.75 mg/dL — ABNORMAL HIGH (ref 0.44–1.00)
Creatinine, Ser: 1.66 mg/dL — ABNORMAL HIGH (ref 0.44–1.00)
GFR calc Af Amer: 31 mL/min — ABNORMAL LOW (ref >60)
GFR calc Af Amer: 33 mL/min — ABNORMAL LOW (ref >60)
GFR calc non Af Amer: 27 mL/min — ABNORMAL LOW (ref >60)
GFR calc non Af Amer: 28 mL/min — ABNORMAL LOW (ref >60)
Glucose, Bld: 188 mg/dL — ABNORMAL HIGH (ref 70–99)
Glucose, Bld: 114 mg/dL — ABNORMAL HIGH (ref 70–99)
Potassium: 4.5 mmol/L (ref 3.5–5.1)
Potassium: 4.2 mmol/L (ref 3.5–5.1)
Sodium: 134 mmol/L — ABNORMAL LOW (ref 135–145)
Sodium: 135 mmol/L (ref 135–145)

## 2019-05-23 LAB — APTT
aPTT: 29 seconds (ref 24–36)
aPTT: >160 seconds (ref 24–36)

## 2019-05-23 LAB — LIPID PANEL
Cholesterol: 120 mg/dL (ref 0–200)
HDL: 41 mg/dL (ref >40)
LDL Cholesterol: 71 mg/dL (ref 0–99)
Total CHOL/HDL Ratio: 2.9 RATIO
Triglycerides: 41 mg/dL (ref <150)
VLDL: 8 mg/dL (ref 0–40)

## 2019-05-23 LAB — PROTIME-INR
INR: 0.9 (ref 0.8–1.2)
INR: 1.1 (ref 0.8–1.2)
Prothrombin Time: 12.3 seconds (ref 11.4–15.2)
Prothrombin Time: 13.6 seconds (ref 11.4–15.2)

## 2019-05-23 LAB — CBC
HCT: 32.9 % — ABNORMAL LOW (ref 36.0–46.0)
HCT: 30.8 % — ABNORMAL LOW (ref 36.0–46.0)
Hemoglobin: 11.1 g/dL — ABNORMAL LOW (ref 12.0–15.0)
Hemoglobin: 10.1 g/dL — ABNORMAL LOW (ref 12.0–15.0)
MCH: 31.4 pg (ref 26.0–34.0)
MCH: 30.5 pg (ref 26.0–34.0)
MCHC: 33.7 g/dL (ref 30.0–36.0)
MCHC: 32.8 g/dL (ref 30.0–36.0)
MCV: 93.2 fL (ref 80.0–100.0)
MCV: 93.1 fL (ref 80.0–100.0)
Platelets: 170 10*3/uL (ref 150–400)
Platelets: 151 10*3/uL (ref 150–400)
RBC: 3.53 MIL/uL — ABNORMAL LOW (ref 3.87–5.11)
RBC: 3.31 MIL/uL — ABNORMAL LOW (ref 3.87–5.11)
RDW: 12.6 % (ref 11.5–15.5)
RDW: 12.7 % (ref 11.5–15.5)
WBC: 6.6 10*3/uL (ref 4.0–10.5)
WBC: 5.6 10*3/uL (ref 4.0–10.5)
nRBC: 0 % (ref 0.0–0.2)
nRBC: 0 % (ref 0.0–0.2)

## 2019-05-23 LAB — RESPIRATORY PANEL BY RT PCR (FLU A&B, COVID)
Influenza A by PCR: NEGATIVE
Influenza B by PCR: NEGATIVE
SARS Coronavirus 2 by RT PCR: NEGATIVE

## 2019-05-23 LAB — LIPASE, BLOOD
Lipase: 62 U/L — ABNORMAL HIGH (ref 11–51)
Lipase: 90 U/L — ABNORMAL HIGH (ref 11–51)

## 2019-05-23 LAB — HEPARIN LEVEL (UNFRACTIONATED)
Heparin Unfractionated: 1.9 IU/mL — ABNORMAL HIGH (ref 0.30–0.70)
Heparin Unfractionated: 1.18 IU/mL — ABNORMAL HIGH (ref 0.30–0.70)

## 2019-05-23 LAB — TROPONIN I (HIGH SENSITIVITY)
Troponin I (High Sensitivity): 78 ng/L — ABNORMAL HIGH (ref <18)
Troponin I (High Sensitivity): 22265 ng/L (ref <18)

## 2019-05-23 LAB — HEMOGLOBIN A1C
Hgb A1c MFr Bld: 5.9 % — ABNORMAL HIGH (ref 4.8–5.6)
Mean Plasma Glucose: 122.63 mg/dL

## 2019-05-23 MED ORDER — ZOLPIDEM TARTRATE 5 MG PO TABS: 5.0000 mg | ORAL_TABLET | Freq: Every evening | ORAL | Status: DC | PRN

## 2019-05-23 MED ORDER — NITROGLYCERIN 0.4 MG SL SUBL: 0.4000 mg | SUBLINGUAL_TABLET | SUBLINGUAL | Status: DC | PRN

## 2019-05-23 MED ORDER — ROSUVASTATIN CALCIUM 10 MG PO TABS
10.0000 mg | ORAL_TABLET | Freq: Every day | ORAL | Status: DC
  Administered 2019-05-23 – 2019-05-24 (×2): 10 mg via ORAL
  Filled 2019-05-23 (×2): qty 1

## 2019-05-23 MED ORDER — ROPINIROLE HCL 0.25 MG PO TABS
0.2500 mg | ORAL_TABLET | ORAL | Status: DC
  Filled 2019-05-23 (×2): qty 1

## 2019-05-23 MED ORDER — ASPIRIN 300 MG RE SUPP: 300.0000 mg | RECTAL | Status: DC

## 2019-05-23 MED ORDER — SODIUM CHLORIDE 0.9 % IV BOLUS
1000.0000 mL | Freq: Once | INTRAVENOUS | Status: AC
  Administered 2019-05-23: 1000 mL via INTRAVENOUS

## 2019-05-23 MED ORDER — ATORVASTATIN CALCIUM 20 MG PO TABS: 40.0000 mg | ORAL_TABLET | Freq: Every day | ORAL | Status: DC

## 2019-05-23 MED ORDER — SODIUM CHLORIDE 0.9% FLUSH
3.0000 mL | Freq: Two times a day (BID) | INTRAVENOUS | Status: AC
  Filled 2019-05-23: qty 3

## 2019-05-23 MED ORDER — ONDANSETRON HCL 4 MG/2ML IJ SOLN
4.0000 mg | Freq: Four times a day (QID) | INTRAMUSCULAR | Status: DC | PRN
  Administered 2019-05-23: 4 mg via INTRAVENOUS
  Filled 2019-05-23: qty 2

## 2019-05-23 MED ORDER — ASPIRIN 81 MG PO CHEW
324.0000 mg | CHEWABLE_TABLET | Freq: Once | ORAL | Status: DC
  Filled 2019-05-23: qty 4

## 2019-05-23 MED ORDER — LOSARTAN POTASSIUM 25 MG PO TABS
25.0000 mg | ORAL_TABLET | Freq: Every day | ORAL | Status: DC
  Administered 2019-05-23 – 2019-05-24 (×2): 25 mg via ORAL
  Filled 2019-05-23 (×2): qty 1

## 2019-05-23 MED ORDER — MORPHINE SULFATE (PF) 2 MG/ML IV SOLN
2.0000 mg | INTRAVENOUS | Status: DC | PRN
  Filled 2019-05-23: qty 1

## 2019-05-23 MED ORDER — ASPIRIN EC 325 MG PO TBEC: 325.0000 mg | DELAYED_RELEASE_TABLET | Freq: Every day | ORAL | Status: DC

## 2019-05-23 MED ORDER — DEXTROSE-NACL 5-0.9 % IV SOLN: INTRAVENOUS | Status: DC

## 2019-05-23 MED ORDER — TRAZODONE HCL 50 MG PO TABS: 50.0000 mg | ORAL_TABLET | Freq: Every evening | ORAL | Status: DC | PRN

## 2019-05-23 MED ORDER — SODIUM CHLORIDE 0.9% FLUSH: 3.0000 mL | INTRAVENOUS | Status: DC | PRN

## 2019-05-23 MED ORDER — SODIUM CHLORIDE 0.9 % IV SOLN: 250.0000 mL | INTRAVENOUS | Status: DC | PRN

## 2019-05-23 MED ORDER — HEPARIN (PORCINE) 25000 UT/250ML-% IV SOLN
850.0000 [IU]/h | INTRAVENOUS | Status: DC
  Administered 2019-05-23: 850 [IU]/h via INTRAVENOUS
  Filled 2019-05-23: qty 250

## 2019-05-23 MED ORDER — SUCRALFATE 1 G PO TABS: 1.0000 g | ORAL_TABLET | Freq: Four times a day (QID) | ORAL | Status: DC

## 2019-05-23 MED ORDER — ACETAMINOPHEN 325 MG PO TABS
650.0000 mg | ORAL_TABLET | ORAL | Status: DC | PRN
  Administered 2019-05-23: 650 mg via ORAL
  Filled 2019-05-23: qty 2

## 2019-05-23 MED ORDER — HEPARIN (PORCINE) 25000 UT/250ML-% IV SOLN
700.0000 [IU]/h | INTRAVENOUS | Status: DC
  Administered 2019-05-23: 700 [IU]/h via INTRAVENOUS
  Filled 2019-05-23: qty 250

## 2019-05-23 MED ORDER — ISOSORBIDE DINITRATE 30 MG PO TABS: 30.0000 mg | ORAL_TABLET | Freq: Every day | ORAL | Status: DC

## 2019-05-23 MED ORDER — MECLIZINE HCL 12.5 MG PO TABS
12.5000 mg | ORAL_TABLET | Freq: Three times a day (TID) | ORAL | Status: DC | PRN
  Filled 2019-05-23: qty 1

## 2019-05-23 MED ORDER — PANTOPRAZOLE SODIUM 40 MG PO TBEC
40.0000 mg | DELAYED_RELEASE_TABLET | Freq: Every day | ORAL | Status: DC
  Administered 2019-05-23 – 2019-05-24 (×2): 40 mg via ORAL
  Filled 2019-05-23 (×2): qty 1

## 2019-05-23 MED ORDER — MORPHINE SULFATE (PF) 4 MG/ML IV SOLN
4.0000 mg | Freq: Once | INTRAVENOUS | Status: AC
  Administered 2019-05-23: 4 mg via INTRAVENOUS
  Filled 2019-05-23: qty 1

## 2019-05-23 MED ORDER — ASPIRIN 81 MG PO CHEW: 324.0000 mg | CHEWABLE_TABLET | ORAL | Status: DC

## 2019-05-23 MED ORDER — SODIUM CHLORIDE 0.9 % IV SOLN: INTRAVENOUS | Status: DC

## 2019-05-23 MED ORDER — METOPROLOL SUCCINATE ER 25 MG PO TB24
25.0000 mg | ORAL_TABLET | Freq: Every day | ORAL | Status: DC
  Administered 2019-05-23 – 2019-05-24 (×2): 25 mg via ORAL
  Filled 2019-05-23 (×2): qty 1

## 2019-05-23 MED ORDER — ASPIRIN 81 MG PO CHEW
81.0000 mg | CHEWABLE_TABLET | ORAL | Status: DC
  Filled 2019-05-23: qty 1

## 2019-05-23 MED ORDER — HEPARIN BOLUS VIA INFUSION
4000.0000 [IU] | Freq: Once | INTRAVENOUS | Status: AC
  Administered 2019-05-23: 4000 [IU] via INTRAVENOUS
  Filled 2019-05-23: qty 4000

## 2019-05-23 MED ORDER — NITROGLYCERIN 2 % TD OINT
1.0000 [in_us] | TOPICAL_OINTMENT | Freq: Once | TRANSDERMAL | Status: AC
  Administered 2019-05-23: 1 [in_us] via TOPICAL
  Filled 2019-05-23: qty 1

## 2019-05-23 MED ORDER — HYDROMORPHONE HCL 1 MG/ML IJ SOLN
0.5000 mg | INTRAMUSCULAR | Status: AC
  Administered 2019-05-23: 0.5 mg via INTRAVENOUS
  Filled 2019-05-23: qty 1

## 2019-05-23 NOTE — ED Notes (Signed)
Date and time results received: 05/23/19 1048 (use smartphrase ".now" to insert current time)  Test: trop Critical Value: 22,265  Name of Provider Notified: MD Mal Misty  Orders Received? Or Actions Taken?: Actions Taken: Mal Misty MD made aware

## 2019-05-23 NOTE — ED Provider Notes (Signed)
[facility] Emergency Department Provider Note  ____________________________________________   First MD Initiated Contact with Patient 05/22/19 2345     (approximate)  I have reviewed the triage vital signs and the nursing notes.   HISTORY  Chief Complaint Emesis and Nausea    HPI Julie Khan is a 85 y.o. female with medical history as listed below who presents by EMS for evaluation of epigastric versus chest pain and persistent vomiting.  She says that she had at least one episode last night but then the symptoms went away and she was fine this morning, even to the point of being asymptomatic when she went to her primary care doctor earlier today.  She told him of the symptoms but since they had resolved it did not seem to be an issue.  However the symptoms started again this evening and have been persistent and severe.  She is having repeated episodes of vomiting and severe sharp pain in the upper part of her abdomen versus the lower part of her chest.  She has no history of heart disease or blood clots in the lungs.  She is reporting the pain is severe and she has vomited multiple times.  She does not have any lower abdominal pain.  She has never had symptoms like this in the past.  She is followed by Dr. Grayland Ormond for multiple myeloma but has been stable for an extended period of time.         Past Medical History:  Diagnosis Date  . Arthritis   . Bladder cancer ([place])   . Bladder cancer ([place])   . Cancer ([place])    multiple myeloma  . Chronic kidney disease   . Chronic kidney disease   . Dizziness   . GERD (gastroesophageal reflux disease)   . Hypertension   . Multiple myeloma ([place])   . Stroke ([place])    tia x 2  . Tubular adenoma of colon     Patient Active Problem List   Diagnosis Date Noted  . Benign essential hypertension 03/06/2019  . Stage 3 chronic kidney disease 03/06/2019  . Leg cramping 11/04/2017  . Tingling 11/04/2017  . IDA  (iron deficiency anemia) 10/05/2017  . Hypertension 01/11/2017  . Dizziness 01/11/2017  . Carotid stenosis 01/11/2017  . Bladder cancer ([place]) 06/29/2016  . 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 Chondrocalcinosis of knee 12/19/2014  . Osteoarthritis of knee 12/19/2014    Past Surgical History:  Procedure Laterality Date  . bladder tumor removed    . BREAST EXCISIONAL BIOPSY Bilateral 1970   Benign  . BREAST SURGERY     bx  . CATARACT EXTRACTION W/PHACO Left 12/16/2016   Procedure: CATARACT EXTRACTION PHACO AND INTRAOCULAR LENS PLACEMENT (IOC);  Surgeon: Eulogio Bear, MD;  Location: [facility] ORS;  Service: Ophthalmology;  Laterality: Left;  [place] 00:38.0 AP% 14.6 CDE 5.54 Fluid pack lot # [id number]  . CATARACT EXTRACTION W/PHACO Right 03/03/2017   Procedure: CATARACT EXTRACTION PHACO AND INTRAOCULAR LENS PLACEMENT ([place]);  Surgeon: Eulogio Bear, MD;  Location: [facility] ORS;  Service: Ophthalmology;  Laterality: Right;  Lot # [id number] H [place]: 00:29.8 AP%: 10.1 CDE: 3.01  . COLONOSCOPY WITH PROPOFOL N/A 07/25/2015   Procedure: COLONOSCOPY WITH PROPOFOL;  Surgeon: Josefine Class, MD;  Location: [facility] ENDOSCOPY;  Service: Endoscopy;  Laterality: N/A;  . COLONOSCOPY WITH PROPOFOL N/A 06/29/2017   Procedure: COLONOSCOPY WITH PROPOFOL;  Surgeon: Toledo, Benay Pike, MD;  Location: [facility] ENDOSCOPY;  Service: Gastroenterology;  Laterality: N/A;  .  ESOPHAGOGASTRODUODENOSCOPY (EGD) WITH PROPOFOL N/A 07/25/2015   Procedure: ESOPHAGOGASTRODUODENOSCOPY (EGD) WITH PROPOFOL;  Surgeon: Josefine Class, MD;  Location[facility]COPY;  Service: Endoscopy;  Laterality: N/A;  . ESOPHAGOGASTRODUODENOSCOPY (EGD) WITH PROPOFOL N/A 06/29/2017   Procedure: ESOPHAGOGASTRODUODENOSCOPY (EGD) WITH PROPOFOL;  Surgeon: Toledo, Benay Pike, MD;  Location: [facility] ENDOSCOPY;  Service: Gastroenterology;  Laterality: N/A;  . HERNIA REPAIR      Prior to Admission medications   Medication Sig Start Date End Date Taking?  Authorizing Provider  isosorbide dinitrate (ISORDIL) 30 MG tablet Take 30 mg by mouth daily. 05/16/19   [provider]  losartan (COZAAR) 25 MG tablet Take 25 mg by mouth daily.    [provider]  meclizine (ANTIVERT) 25 MG tablet TK 1 T PO TID 10/26/17   [provider]  metoprolol succinate (TOPROL-XL) 25 MG 24 hr tablet Take 25 mg by mouth daily.    [provider]  nitroGLYCERIN (NITROSTAT) 0.3 MG SL tablet Place 1 tablet under the tongue as needed. 05/07/19   [provider]  pantoprazole (PROTONIX) 40 MG tablet Take 40 mg by mouth daily.    [provider]  rOPINIRole (REQUIP) 0.25 MG tablet Take 1 tablet by mouth 1 day or 1 dose. 10/24/18   [provider]  rosuvastatin (CRESTOR) 10 MG tablet Take 10 mg by mouth daily. 05/15/19   [provider]  simvastatin (ZOCOR) 20 MG tablet Take 46m by mouth once daily 07/21/16   [provider]  traZODone (DESYREL) 50 MG tablet Take 1 tablet by mouth at bedtime as needed. 10/16/18   [provider]    Allergies Iodine and Penicillins  Family History  Problem Relation Age of Onset  . Cancer Mother   . Colon cancer Mother   . Colon polyps Mother   . Cancer Sister   . Breast cancer Sister 434 . Cancer Brother   . Bladder Cancer Neg Hx   . Kidney cancer Neg Hx     Social History Social History   Tobacco Use  . Smoking status: Former SResearch scientist (life sciences) . Smokeless tobacco: Never Used  Substance Use Topics  . Alcohol use: No  . Drug use: No    Review of Systems Constitutional: No fever/chills Eyes: No visual changes. ENT: No sore throat. Cardiovascular: Lower chest pain versus upper abdominal pain as described above. Respiratory: Denies shortness of breath. Gastrointestinal: Upper abdominal versus lower chest pain with persistent nausea and vomiting.  Genitourinary: Negative for dysuria. Musculoskeletal: Negative for neck pain.  Negative for back  pain. Integumentary: Negative for rash. Neurological: Negative for headaches, focal weakness or numbness.   ____________________________________________   PHYSICAL EXAM:  VITAL SIGNS: ED Triage Vitals [05/22/19 2341]  Enc Vitals Group     BP (!) 176/95     Pulse Rate 81     Resp 20     Temp 99 F (37.2 C)     Temp Source Oral     SpO2 100 %     Weight      Height      Head Circumference      Peak Flow      Pain Score      Pain Loc      Pain Edu?      Excl. in [place]     Constitutional: Alert and oriented.  Patient appears very uncomfortable and is actively vomiting and reporting severe pain when I evaluated her. Eyes: Conjunctivae are normal.  Head: Atraumatic. Nose:  No congestion/rhinnorhea. Mouth/Throat: Patient is wearing a mask. Neck: No stridor.  No meningeal signs.   Cardiovascular: Normal rate, regular rhythm. Good peripheral circulation. Grossly normal heart sounds. Respiratory: Normal respiratory effort.  No retractions. Gastrointestinal: Severe epigastric tenderness to palpation, no significant tenderness to palpation of the right upper quadrant.  No significant distention. Musculoskeletal: No lower extremity tenderness nor edema. No gross deformities of extremities. Neurologic:  Normal speech and language. No gross focal neurologic deficits are appreciated.  Skin:  Skin is warm, dry and intact. Psychiatric: Mood and affect are normal. Speech and behavior are normal.  ____________________________________________   LABS (all labs ordered are listed, but only abnormal results are displayed)  Labs Reviewed  LIPASE, BLOOD - Abnormal; Notable for the following components:      Result Value   Lipase 90 (*)    All other components within normal limits  COMPREHENSIVE METABOLIC PANEL - Abnormal; Notable for the following components:   Sodium 133 (*)    Glucose, Bld 121 (*)    BUN 39 (*)    Creatinine, Ser 2.11 (*)    Total Protein 9.1 (*)    AST 43 (*)     GFR calc non Af Amer 21 (*)    GFR calc Af Amer 25 (*)    All other components within normal limits  TROPONIN I (HIGH SENSITIVITY) - Abnormal; Notable for the following components:   Troponin I (High Sensitivity) 78 (*)    All other components within normal limits  RESPIRATORY PANEL BY RT PCR (FLU A&B, COVID)  CBC WITH DIFFERENTIAL/PLATELET  PROTIME-INR  APTT   ____________________________________________  EKG  ED ECG REPORT I, Hinda Kehr, the attending physician, personally viewed and interpreted this ECG.  Date: 05/22/2019 EKG Time: 23: 46 Rate: 82 Rhythm: Sinus rhythm with first-degree AV block QRS Axis: normal Intervals: Prolonged PR interval at 236 ms.  QTc interval is 433 ms. ST/T Wave abnormalities: Diffuse ST-T segment depression particularly in the inferior leads as well as V3-V6. Narrative Interpretation: Concerning for acute ischemia but does not meet STEMI criteria.  ED ECG REPORT I, Hinda Kehr, the attending physician, personally viewed and interpreted this ECG.  Date: 05/23/2019 EKG Time: 1:38 AM Rate: 84 Rhythm: Sinus rhythm with first-degree AV block QRS Axis: normal Intervals: Slightly increased PR interval at 209 but less abnormal than prior EKG ST/T Wave abnormalities: Non-specific ST segment / T-wave changes, but no clear evidence of acute ischemia. Narrative Interpretation: no definitive evidence of acute ischemia; does not meet STEMI criteria.  Considerably improved morphology from prior EKG in spite of patient reporting that her symptoms are the same.   ____________________________________________  RADIOLOGY Ursula Alert, personally viewed and evaluated these images (plain radiographs) as part of my medical decision making, as well as reviewing the written report by the radiologist.  ED MD interpretation: CT scan appears consistent with mild pancreatitis.  No biliary dilatation.  Official radiology report(s): CT ABDOMEN PELVIS WO  CONTRAST  Result Date: 05/23/2019 CLINICAL DATA:  Nausea and vomiting. Epigastric abdominal pain. Elevated lipase, pancreatitis suspected. Chronic kidney disease with acute kidney injury. EXAM: CT ABDOMEN AND PELVIS WITHOUT CONTRAST TECHNIQUE: Multidetector CT imaging of the abdomen and pelvis was performed following the standard protocol without IV contrast. COMPARISON:  None. FINDINGS: Lower chest: Scattered linear atelectasis. No pleural fluid. Small hiatal hernia. Hepatobiliary: Low-density in the subcapsular right lobe appears linear and likely represents diaphragmatic undulation rather than focal lesion. Gallbladder physiologically distended, no calcified stone. No biliary dilatation.  Pancreas: Mild peripancreatic fat stranding about the pancreatic head, series 2, image 28. No focal peripancreatic fluid collection. No ductal dilatation. Spleen: Normal in size without focal abnormality. Adrenals/Urinary Tract: No adrenal nodule. No hydronephrosis or perinephric edema. Suspected parapelvic cysts in the left kidney. No renal stones. Urinary bladder is physiologically distended. No bladder wall thickening. Stomach/Bowel: Small hiatal hernia. Stomach is nondistended. Normal positioning of the ligament of Treitz. No small bowel obstruction or inflammation. Possible diminutive appendix, regardless, no appendicitis. Moderate volume of stool throughout the colon. Mild tortuosity of the splenic flexure. Mild left colonic diverticulosis, most prominent in the sigmoid. No acute diverticulitis. Vascular/Lymphatic: Aortic and branch atherosclerosis. No aortic aneurysm. No adenopathy. Reproductive: Atrophic uterus, normal for age. No adnexal mass. Other: No significant ascites or free fluid. No free air. No focal fluid collection. Musculoskeletal: Degenerative change in the spine and sacroiliac joints. There are no acute or suspicious osseous abnormalities. 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 IMPRESSION: 1. Mild  peripancreatic fat stranding about the pancreatic head consistent with acute pancreatitis. No peripancreatic fluid collection. 2. Colonic diverticulosis without diverticulitis. 3. Small hiatal hernia. Aortic Atherosclerosis (ICD10-I70.0). Electronically Signed   By: Keith Rake M.D.   On: 05/23/2019 01:40    ____________________________________________   PROCEDURES   Procedure(s) performed (including Critical Care):  .Critical Care Performed by: Hinda Kehr, MD Authorized by: Hinda Kehr, MD   Critical care provider statement:    Critical care time (minutes):  30   Critical care time was exclusive of:  Separately billable procedures and treating other patients   Critical care was necessary to treat or prevent imminent or life-threatening deterioration of the following conditions: possible NSTEMI requiring heparin.   Critical care was time spent personally by me on the following activities:  Development of treatment plan with patient or surrogate, discussions with consultants, evaluation of patient's response to treatment, examination of patient, obtaining history from patient or surrogate, ordering and performing treatments and interventions, ordering and review of laboratory studies, ordering and review of radiographic studies, pulse oximetry, re-evaluation of patient's condition and review of old charts     ____________________________________________   [place] / MDM / [place] / ED COURSE  As part of my medical decision making, I reviewed the following data within the [place] notes reviewed and incorporated, Labs reviewed , EKG interpreted , Old chart reviewed, Discussed with admitting physician (Dr. Sidney Ace) and reviewed Notes from prior ED visits   Differential diagnosis includes, but is not limited to, pancreatitis, biliary colic, SBO/ileus, ACS, viral gastroenteritis.  The patient has no known bad food exposures.  No  recent medication changes, no alcohol use.  She is presenting most consistent with a biliary issue.  Lab work is pending and I have also ordered an EKG.  Zofran 4 mg IV and morphine 4 mg IV and 1 L normal saline.  She has chronic kidney disease and she may be volume depleted.      Clinical Course as of May 22 201  Wed May 23, 2019  0044 Lab work demonstrates some degree of AKI on top of her existing chronic kidney disease with an elevation of her creatinine but not too far above baseline which seems to be around 1.7 (is 2.1 today).  However her GFR is only about 21.  Her lipase is 90 which could indicate acute pancreatitis.  I cannot use IV contrast but I will obtain a CT scan without contrast of her abdomen and  pelvis to look for any obstruction, ileus, or radiographic evidence of pancreatitis even though it is likely to be a suboptimal study given no contrast.  No leukocytosis on CBC.   [CF]  0127 Concerning for demand ischemia vs MI  Troponin I (High Sensitivity)(!): 78 [CF]  0138 The patient is still having pain but a second EKG has normalized.  There is still a small amount of ST depression but it is not as substantial as the original EKG.  Her blood pressure is elevated and I will give her 1 inch of nitroglycerin paste and another dose of morphine.  I am going to start heparin bolus and infusion.  There is no indication for emergent cardiology consultation at this time and they can see the patient in the morning.  I am getting a rapid Covid swab given the possibility that she may require Cath Lab.   [CF]  0151 I have ordered coagulation studies, heparin bolus plus infusion, aspirin 324 mg by mouth, Covid swab, IV fluid infusion after bolus completion, nitroglycerin 1 inch paste on chest wall.  Consulted hospitalist for admission.   [CF]  0151 Ordered Dilaudid 0.5 mg IV for improved pain control   [CF]  0153 Since the patient is n.p.o., I ordered D5 normal saline at 100 mL an hour rather than a  normal saline infusion.   [CF]  0203 Discussed case by phone with Dr. Sidney Ace who will admit.   [CF]    Clinical Course User Index [CF] Hinda Kehr, MD     ____________________________________________  FINAL CLINICAL IMPRESSION(S) / ED DIAGNOSES  Final diagnoses:  Acute pancreatitis, unspecified complication status, unspecified pancreatitis type  Elevated troponin level  Demand ischemia (HCC)  Chest pain, unspecified type  Epigastric pain  Intractable nausea and vomiting  Acute renal failure superimposed on chronic kidney disease, unspecified CKD stage, unspecified acute renal failure type ([place])     MEDICATIONS GIVEN DURING THIS VISIT:  Medications  aspirin chewable tablet 324 mg (has no administration in time range)  nitroGLYCERIN (NITROGLYN) 2 % ointment 1 inch (has no administration in time range)  HYDROmorphone (DILAUDID) injection 0.5 mg (has no administration in time range)  dextrose 5 %-0.9 % sodium chloride infusion (has no administration in time range)  heparin bolus via infusion 4,000 Units (has no administration in time range)    Followed by  heparin ADULT infusion 100 units/mL (25000 units/238m sodium chloride 0.45%) (has no administration in time range)  ondansetron (ZOFRAN) injection 4 mg (4 mg Intravenous Given 05/22/19 2355)  morphine 4 MG/ML injection 4 mg (4 mg Intravenous Given 05/23/19 0015)  sodium chloride 0.9 % bolus 1,000 mL (1,000 mLs Intravenous New Bag/Given 05/23/19 0043)     ED Discharge Orders    None      *Please note:  PShaina Gullattwas evaluated in Emergency Department on 05/23/2019 for the symptoms described in the history of present illness. She was evaluated in the context of the global COVID-19 pandemic, which necessitated consideration that the patient might be at risk for infection with the SARS-CoV-2 virus that causes COVID-19. Institutional protocols and algorithms that pertain to the evaluation of patients at risk for COVID-19 are  in a state of rapid change based on information released by regulatory bodies including the CDC and federal and state organizations. These policies and algorithms were followed during the patient's care in the ED.  Some ED evaluations and interventions may be delayed as a result of limited staffing during the pandemic.*  Note:  This document was prepared using Dragon voice recognition software and may include unintentional dictation errors.   Hinda Kehr, MD 05/23/19 [phone]

## 2019-05-23 NOTE — ED Notes (Signed)
MD Ayiku at bedside. 

## 2019-05-23 NOTE — ED Notes (Signed)
Mikki Santee (son) [phone] to provide status update.

## 2019-05-23 NOTE — Progress Notes (Addendum)
Patient seen and examined this morning.  She has no complaints.  She feels better.  She had epigastric pain prior to admission but this has improved.  Vital signs are stable.  Physical exam is unremarkable.  Patient found to have acute non-ST elevation MI.  Initial troponin was 78 and repeat troponin 22,265.  Dr. Humphrey Rolls, cardiologist has been consulted.  Patient is on IV heparin infusion, aspirin and Crestor.  Of note, patient said she had not been taking aspirin at home because it caused stomach upset.

## 2019-05-23 NOTE — ED Notes (Signed)
Pt laying on bed comfortable. denies CP/SHOB at this tim. RR even an unlabored this Rn will continue to monitor pt.

## 2019-05-23 NOTE — H&P (Addendum)
[facility] NAME: Julie Khan    MR#:  [id number]  DATE OF BIRTH:  February 19, 1935  DATE OF ADMISSION:  05/22/2019  PRIMARY CARE PHYSICIAN: Jodi Marble, MD   REQUESTING/REFERRING PHYSICIAN: Hinda Kehr, MD  CHIEF COMPLAINT:   Chief Complaint  Patient presents with  . Emesis  . Nausea    HISTORY OF PRESENT ILLNESS:  Julie Khan  is a 85 y.o. [place]female with a known history of hypertension, multiple myeloma, CVA and chronic kidney disease, presented to emergency room with acute onset of epigastric and substernal chest pain which she described as a sharp pain that started last night and was graded 10/10 in severity with diaphoresis, nausea and vomiting.  She denied any dyspnea or palpitations, cough or wheezing or hemoptysis.  No leg pain or edema recent travels or surgeries.  No fever or chills.  No dysuria, oliguria or hematuria or flank pain.  No COVID-19 exposure.  Upon presentation to the emergency room, blood pressure was 145/78 with otherwise normal vital signs.  Blood pressure later on in the evening 134/114 with a pulse of 26.  The patient's pulse oximetry is within normal.  Labs revealed minimal hyponatremia and a BUN of 39 with creatinine of 2.11 compared to 32 and 1.4 on 11/02/2018.  CBC is unremarkable.  EKG showed normal sinus rhythm with rate of 82 with left atrial enlargement, prolonged PR interval and inferior ST segment depression.  Abdominal pelvic CT scan revealed: 1. Mild peripancreatic fat stranding about the pancreatic head consistent with acute pancreatitis. No peripancreatic fluid collection. 2. Colonic diverticulosis without diverticulitis. 3. Small hiatal hernia.   The patient was given 4 baby aspirin, IV heparin bolus and drip, 0.5 mg IV Dilaudid, 4 mg IV morphine sulfate and 4 mg IV Zofran as well as an inch Nitropaste and 1 L bolus of IV normal saline.  She was then started on D5 normal saline at 100 mill per  hour.  He will be admitted to a telemetry bed for further evaluation and management.    PAST MEDICAL HISTORY:   Past Medical History:  Diagnosis Date  . Arthritis   . Bladder cancer ([place])   . Bladder cancer ([place])   . Cancer ([place])    multiple myeloma  . Chronic kidney disease   . Chronic kidney disease   . Dizziness   . GERD (gastroesophageal reflux disease)   . Hypertension   . Multiple myeloma ([place])   . Stroke ([place])    tia x 2  . Tubular adenoma of colon     PAST SURGICAL HISTORY:   Past Surgical History:  Procedure Laterality Date  . bladder tumor removed    . BREAST EXCISIONAL BIOPSY Bilateral 1970   Benign  . BREAST SURGERY     bx  . CATARACT EXTRACTION W/PHACO Left 12/16/2016   Procedure: CATARACT EXTRACTION PHACO AND INTRAOCULAR LENS PLACEMENT (IOC);  Surgeon: Eulogio Bear, MD;  Location: [facility] ORS;  Service: Ophthalmology;  Laterality: Left;  [place] 00:38.0 AP% 14.6 CDE 5.54 Fluid pack lot # [id number]  . CATARACT EXTRACTION W/PHACO Right 03/03/2017   Procedure: CATARACT EXTRACTION PHACO AND INTRAOCULAR LENS PLACEMENT ([place]);  Surgeon: Eulogio Bear, MD;  Location: [facility] ORS;  Service: Ophthalmology;  Laterality: Right;  Lot # [id number] [place]: 00:29.8 AP%: 10.1 CDE: 3.01  . COLONOSCOPY WITH PROPOFOL N/A 07/25/2015   Procedure: COLONOSCOPY WITH PROPOFOL;  Surgeon: Josefine Class, MD;  Location: [facility] ENDOSCOPY;  Service:  Endoscopy;  Laterality: N/A;  . COLONOSCOPY WITH PROPOFOL N/A 06/29/2017   Procedure: COLONOSCOPY WITH PROPOFOL;  Surgeon: Toledo, Benay Pike, MD;  Location: [facility] ENDOSCOPY;  Service: Gastroenterology;  Laterality: N/A;  . ESOPHAGOGASTRODUODENOSCOPY (EGD) WITH PROPOFOL N/A 07/25/2015   Procedure: ESOPHAGOGASTRODUODENOSCOPY (EGD) WITH PROPOFOL;  Surgeon: Josefine Class, MD;  Location[facility] ENDOSCOPY;  Service: Endoscopy;  Laterality: N/A;  . ESOPHAGOGASTRODUODENOSCOPY (EGD) WITH PROPOFOL N/A 06/29/2017   Procedure: ESOPHAGOGASTRODUODENOSCOPY  (EGD) WITH PROPOFOL;  Surgeon: Toledo, Benay Pike, MD;  Location: [facility] ENDOSCOPY;  Service: Gastroenterology;  Laterality: N/A;  . HERNIA REPAIR      SOCIAL HISTORY:   Social History   Tobacco Use  . Smoking status: Former Research scientist (life sciences)  . Smokeless tobacco: Never Used  Substance Use Topics  . Alcohol use: No    FAMILY HISTORY:   Family History  Problem Relation Age of Onset  . Cancer Mother   . Colon cancer Mother   . Colon polyps Mother   . Cancer Sister   . Breast cancer Sister 72  . Cancer Brother   . Bladder Cancer Neg Hx   . Kidney cancer Neg Hx     DRUG ALLERGIES:   Allergies  Allergen Reactions  . Iodine     Seizure  Betadine ok Other reaction(s): Other (See Comments) Sneezing Seizure  Betadine ok  . Penicillins Other (See Comments) and Rash    Has patient had a PCN reaction causing immediate rash, facial/tongue/throat swelling, SOB or lightheadedness with hypotension: Unknown Has patient had a PCN reaction causing severe rash involving mucus membranes or skin necrosis: Unknown Has patient had a PCN reaction that required hospitalization: Unknown Has patient had a PCN reaction occurring within the last 10 years: Unknown If all of the above answers are "NO", then may proceed with Cephalosporin use.  Other reaction(s): Other (See Comments) Has patient had a PCN reaction causing immediate rash, facial/tongue/throat swelling, SOB or lightheadedness with hypotension: Unknown Has patient had a PCN reaction causing severe rash involving mucus membranes or skin necrosis: Unknown Has patient had a PCN reaction that required hospitalization: Unknown Has patient had a PCN reaction occurring within the last 10 years: Unknown If all of the above answers are "NO", then may proceed with Cephalosporin use. Has patient had a PCN reaction causing immediate rash, facial/tongue/throat swelling, SOB or lightheadedness with hypotension: Unknown Has patient had a PCN reaction causing  severe rash involving mucus membranes or skin necrosis: Unknown Has patient had a PCN reaction that required hospitalization: Unknown Has patient had a PCN reaction occurring within the last 10 years: Unknown If all of the above answers are "NO", then may proceed with Cephalosporin use.    REVIEW OF SYSTEMS:   ROS As per history of present illness. All pertinent systems were reviewed above. Constitutional,  HEENT, cardiovascular, respiratory, GI, GU, musculoskeletal, neuro, psychiatric, endocrine,  integumentary and hematologic systems were reviewed and are otherwise  negative/unremarkable except for positive findings mentioned above in the HPI.   MEDICATIONS AT HOME:   Prior to Admission medications   Medication Sig Start Date End Date Taking? Authorizing Provider  isosorbide dinitrate (ISORDIL) 30 MG tablet Take 30 mg by mouth daily. 05/16/19   [provider]  losartan (COZAAR) 25 MG tablet Take 25 mg by mouth daily.    [provider]  meclizine (ANTIVERT) 25 MG tablet TK 1 T PO TID 10/26/17   [provider]  metoprolol succinate (TOPROL-XL) 25 MG 24 hr tablet Take 25 mg by  mouth daily.    [provider]  nitroGLYCERIN (NITROSTAT) 0.3 MG SL tablet Place 1 tablet under the tongue as needed. 05/07/19   [provider]  pantoprazole (PROTONIX) 40 MG tablet Take 40 mg by mouth daily.    [provider]  rOPINIRole (REQUIP) 0.25 MG tablet Take 1 tablet by mouth 1 day or 1 dose. 10/24/18   [provider]  rosuvastatin (CRESTOR) 10 MG tablet Take 10 mg by mouth daily. 05/15/19   [provider]  simvastatin (ZOCOR) 20 MG tablet Take 43m by mouth once daily 07/21/16   [provider]  sucralfate (CARAFATE) 1 g tablet Take 1 g by mouth 4 (four) times daily. 05/22/19   [provider]  traZODone (DESYREL) 50 MG tablet Take 1 tablet by mouth at bedtime as needed. 10/16/18   [provider]       VITAL SIGNS:  Blood pressure (!) 161/87, pulse 87, temperature 99 F (37.2 C), temperature source Oral, resp. rate 18, SpO2 96 %.  PHYSICAL EXAMINATION:  Physical Exam  GENERAL:  85y.o.-year-old patient lying in the bed with no acute distress.  EYES: Pupils equal, round, reactive to light and accommodation. No scleral icterus. Extraocular muscles intact.  HEENT: Head atraumatic, normocephalic. Oropharynx and nasopharynx clear.  NECK:  Supple, no jugular venous distention. No thyroid enlargement, no tenderness.  LUNGS: Normal breath sounds bilaterally, no wheezing, rales,rhonchi or crepitation. No use of accessory muscles of respiration.  CARDIOVASCULAR: Regular rate and rhythm, S1, S2 normal. No murmurs, rubs, or gallops.  ABDOMEN: Soft, nondistended with mild epigastric tenderness without rebound tenderness guarding or rigidity.  Bowel sounds present. No organomegaly or mass.  EXTREMITIES: No pedal edema, cyanosis, or clubbing.  NEUROLOGIC: Cranial nerves II through XII are intact. Muscle strength 5/5 in all extremities. Sensation intact. Gait not checked.  PSYCHIATRIC: The patient is alert and oriented x 3.  Normal affect and good eye contact. SKIN: No obvious rash, lesion, or ulcer.   LABORATORY PANEL:   CBC Recent Labs  Lab 05/22/19 2351  WBC 6.8  HGB 12.3  HCT 36.4  PLT 170   ------------------------------------------------------------------------------------------------------------------  Chemistries  Recent Labs  Lab 05/22/19 2351  NA 133*  K 4.2  CL 100  CO2 22  GLUCOSE 121*  BUN 39*  CREATININE 2.11*  CALCIUM 9.3  AST 43*  ALT 21  ALKPHOS 102  BILITOT 0.6   ------------------------------------------------------------------------------------------------------------------  Cardiac Enzymes No results for input(s): TROPONINI in the last 168  hours. ------------------------------------------------------------------------------------------------------------------  RADIOLOGY:  CT ABDOMEN PELVIS WO CONTRAST  Result Date: 05/23/2019 CLINICAL DATA:  Nausea and vomiting. Epigastric abdominal pain. Elevated lipase, pancreatitis suspected. Chronic kidney disease with acute kidney injury. EXAM: CT ABDOMEN AND PELVIS WITHOUT CONTRAST TECHNIQUE: Multidetector CT imaging of the abdomen and pelvis was performed following the standard protocol without IV contrast. COMPARISON:  None. FINDINGS: Lower chest: Scattered linear atelectasis. No pleural fluid. Small hiatal hernia. Hepatobiliary: Low-density in the subcapsular right lobe appears linear and likely represents diaphragmatic undulation rather than focal lesion. Gallbladder physiologically distended, no calcified stone. No biliary dilatation. Pancreas: Mild peripancreatic fat stranding about the pancreatic head, series 2, image 28. No focal peripancreatic fluid collection. No ductal dilatation. Spleen: Normal in size without focal abnormality. Adrenals/Urinary Tract: No adrenal nodule. No hydronephrosis or perinephric edema. Suspected parapelvic cysts in the left kidney. No renal stones. Urinary bladder is physiologically distended. No bladder wall thickening. Stomach/Bowel: Small hiatal hernia. Stomach is nondistended. Normal positioning of the ligament of  Treitz. No small bowel obstruction or inflammation. Possible diminutive appendix, regardless, no appendicitis. Moderate volume of stool throughout the colon. Mild tortuosity of the splenic flexure. Mild left colonic diverticulosis, most prominent in the sigmoid. No acute diverticulitis. Vascular/Lymphatic: Aortic and branch atherosclerosis. No aortic aneurysm. No adenopathy. Reproductive: Atrophic uterus, normal for age. No adnexal mass. Other: No significant ascites or free fluid. No free air. No focal fluid collection. Musculoskeletal: Degenerative  change in the spine and sacroiliac joints. There are no acute or suspicious osseous abnormalities. 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 IMPRESSION: 1. Mild peripancreatic fat stranding about the pancreatic head consistent with acute pancreatitis. No peripancreatic fluid collection. 2. Colonic diverticulosis without diverticulitis. 3. Small hiatal hernia. Aortic Atherosclerosis (ICD10-I70.0). Electronically Signed   By: Keith Rake M.D.   On: 05/23/2019 01:40      IMPRESSION AND PLAN:   1.  Substernal chest pain with elevated troponin I, rule out acute coronary syndrome/non-ST elevation MI.  The patient will be admitted to a telemetry bed.  We will follow serial troponin I levels.  Pain management to be provided with as needed IV morphine sulfate and sublingual nitroglycerin.  The patient will be on aspirin and will continue her Isordil and ARB as well as beta-blocker therapy with Toprol-XL and statin therapy with Crestor.  Will obtain a cardiology consultation by Dr. Humphrey Rolls, the patient's cardiologist.  She apparently had a fairly recent negative stress test and 2D echo by Dr. Humphrey Rolls.  2.  Acute pancreatitis.  We will check fasting lipids.  The patient will be kept n.p.o.  Pain management will be provided.  3.  Acute kidney injury on stage IIIb chronic kidney disease.  The patient will be placed on hydration with IV normal saline and will follow his BMP.  We will hold nephrotoxins.  4.  Dyslipidemia.  We will continue Crestor.  5.  Hypertension.  We will hold off Cozaar and continue Imdur and Toprol-XL.  6.  DVT prophylaxis.  Subcutaneous Lovenox.  7.  GI prophylaxis.  The patient will be placed on PPI therapy  All the records are reviewed and case discussed with ED provider. The plan of care was discussed in details with the patient (and family). I answered all questions. The patient agreed to proceed with the above mentioned plan. Further management will depend upon hospital  course.   CODE STATUS: Full code  TOTAL TIME TAKING CARE OF THIS PATIENT: 60 minutes.    Christel Mormon M.D on 05/23/2019 at 3:04 AM  [facility]   From 7 PM-7 AM, contact night-coverage [email or web address]  CC: Primary care physician; Jodi Marble, MD   Note: This dictation was prepared with Dragon dictation along with smaller phrase technology. Any transcriptional errors that result from this process are unintentional.

## 2019-05-23 NOTE — ED Notes (Signed)
Pt provided with water by this RN.

## 2019-05-23 NOTE — ED Notes (Signed)
Pt denies CP/SHOB at this time.

## 2019-05-23 NOTE — Consult Note (Addendum)
With the Patient's increase in troponin. Plan on Lt Heart Cath 2/18   Julie Khan is a 85 y.o. female  [id number]  Primary Cardiologist: Neoma Laming Reason for Consultation: Angina  HPI: Patient with a history of hypertension, multiple myeloma, CVA, and CKD presented to the ED with acute angina. Patient described the pain as epigastric and 10/10 without radiation. Patient was also diaphoretic with N/V. Patient had stable BP and pulse with an increase in creatinine to 2.11. Abd CT with evidence of pancreatitis with an elevated lipase. Upon arrival, patient was given aspirin and 1 in nitropaste and started on a heparin drip in case of ACS.    Review of Systems: Denies angina, dyspnea, orthopnea, PND   Past Medical History:  Diagnosis Date  . Arthritis   . Bladder cancer ([place])   . Bladder cancer ([place])   . Cancer ([place])    multiple myeloma  . Chronic kidney disease   . Chronic kidney disease   . Dizziness   . GERD (gastroesophageal reflux disease)   . Hypertension   . Multiple myeloma ([place])   . Stroke ([place])    tia x 2  . Tubular adenoma of colon     Medications Prior to Admission  Medication Sig Dispense Refill  . losartan (COZAAR) 25 MG tablet Take 25 mg by mouth daily.    . meclizine (ANTIVERT) 25 MG tablet Take 25 mg by mouth 3 (three) times daily as needed.   1  . metoprolol succinate (TOPROL-XL) 25 MG 24 hr tablet Take 50 mg by mouth daily.     . nitroGLYCERIN (NITROSTAT) 0.3 MG SL tablet Place 1 tablet under the tongue as needed.    . pantoprazole (PROTONIX) 40 MG tablet Take 40 mg by mouth daily.    . rosuvastatin (CRESTOR) 10 MG tablet Take 10 mg by mouth daily.    . isosorbide dinitrate (ISORDIL) 30 MG tablet Take 30 mg by mouth daily.    . sucralfate (CARAFATE) 1 g tablet Take 1 g by mouth 4 (four) times daily.    . traZODone (DESYREL) 50 MG tablet Take 1 tablet by mouth at bedtime as needed.       Marland Kitchen aspirin  324 mg Oral NOW   Or  . aspirin  300 mg Rectal NOW   . [START ON 05/24/2019] aspirin EC  325 mg Oral Daily  . losartan  25 mg Oral Daily  . metoprolol succinate  25 mg Oral Daily  . pantoprazole  40 mg Oral Daily  . rOPINIRole  0.25 mg Oral 1 day or 1 dose  . rosuvastatin  10 mg Oral Daily    Infusions: . sodium chloride 75 mL/hr at 05/23/19 1344  . heparin 700 Units/hr (05/23/19 1346)    Allergies  Allergen Reactions  . Iodine     Seizure  Betadine ok Other reaction(s): Other (See Comments) Sneezing Seizure  Betadine ok  . Penicillins Other (See Comments) and Rash    Has patient had a PCN reaction causing immediate rash, facial/tongue/throat swelling, SOB or lightheadedness with hypotension: Unknown Has patient had a PCN reaction causing severe rash involving mucus membranes or skin necrosis: Unknown Has patient had a PCN reaction that required hospitalization: Unknown Has patient had a PCN reaction occurring within the last 10 years: Unknown If all of the above answers are "NO", then may proceed with Cephalosporin use.  Other reaction(s): Other (See Comments) Has patient had a PCN reaction causing immediate rash, facial/tongue/throat swelling, SOB or lightheadedness  with hypotension: Unknown Has patient had a PCN reaction causing severe rash involving mucus membranes or skin necrosis: Unknown Has patient had a PCN reaction that required hospitalization: Unknown Has patient had a PCN reaction occurring within the last 10 years: Unknown If all of the above answers are "NO", then may proceed with Cephalosporin use. Has patient had a PCN reaction causing immediate rash, facial/tongue/throat swelling, SOB or lightheadedness with hypotension: Unknown Has patient had a PCN reaction causing severe rash involving mucus membranes or skin necrosis: Unknown Has patient had a PCN reaction that required hospitalization: Unknown Has patient had a PCN reaction occurring within the last 10 years: Unknown If all of the above answers are "NO",  then may proceed with Cephalosporin use.    Social History   Socioeconomic History  . Marital status: Divorced    Spouse name: Not on file  . Number of children: Not on file  . Years of education: Not on file  . Highest education level: Not on file  Occupational History  . Not on file  Tobacco Use  . Smoking status: Former Research scientist (life sciences)  . Smokeless tobacco: Never Used  Substance and Sexual Activity  . Alcohol use: No  . Drug use: No  . Sexual activity: Not on file    Comment: Single   Other Topics Concern  . Not on file  Social History Narrative  . Not on file   Social Determinants of Health   Financial Resource Strain:   . Difficulty of Paying Living Expenses: Not on file  Food Insecurity:   . Worried About Charity fundraiser in the Last Year: Not on file  . Ran Out of Food in the Last Year: Not on file  Transportation Needs:   . Lack of Transportation (Medical): Not on file  . Lack of Transportation (Non-Medical): Not on file  Physical Activity:   . Days of Exercise per Week: Not on file  . Minutes of Exercise per Session: Not on file  Stress:   . Feeling of Stress : Not on file  Social Connections:   . Frequency of Communication with Friends and Family: Not on file  . Frequency of Social Gatherings with Friends and Family: Not on file  . Attends Religious Services: Not on file  . Active Member of Clubs or Organizations: Not on file  . Attends Archivist Meetings: Not on file  . Marital Status: Not on file  Intimate Partner Violence:   . Fear of Current or Ex-Partner: Not on file  . Emotionally Abused: Not on file  . Physically Abused: Not on file  . Sexually Abused: Not on file    Family History  Problem Relation Age of Onset  . Cancer Mother   . Colon cancer Mother   . Colon polyps Mother   . Cancer Sister   . Breast cancer Sister 52  . Cancer Brother   . Bladder Cancer Neg Hx   . Kidney cancer Neg Hx     PHYSICAL EXAM: Vitals:   05/23/19  1146 05/23/19 1330  BP: 119/85 112/69  Pulse: 87 75  Resp:  18  Temp:  97.6 F (36.4 C)  SpO2:  99%     Intake/Output Summary (Last 24 hours) at 05/23/2019 1408 Last data filed at 05/23/2019 0222 Gross per 24 hour  Intake 1000 ml  Output --  Net 1000 ml    General:  Well appearing. No respiratory difficulty HEENT: normal Neck: supple. no JVD. Carotids 2+  bilat; no bruits. No lymphadenopathy or thryomegaly appreciated. Cor: PMI nondisplaced. Regular rate & rhythm. No rubs, gallops or murmurs. Lungs: clear Abdomen: soft, nontender, nondistended. No hepatosplenomegaly. No bruits or masses. Good bowel sounds. Extremities: no cyanosis, clubbing, rash, edema Neuro: alert & oriented x 3, cranial nerves grossly intact. moves all 4 extremities w/o difficulty. Affect pleasant.  ECG: NSR. HR 82. Mild ST depression in inferior leads.  Results for orders placed or performed during the hospital encounter of 05/22/19 (from the past 24 hour(s))  CBC with Differential/Platelet     Status: None   Collection Time: 05/22/19 11:51 PM  Result Value Ref Range   WBC 6.8 4.0 - 10.5 K/uL   RBC 3.96 3.87 - 5.11 MIL/uL   Hemoglobin 12.3 12.0 - 15.0 g/dL   HCT 36.4 36.0 - 46.0 %   MCV 91.9 80.0 - 100.0 fL   MCH 31.1 26.0 - 34.0 pg   MCHC 33.8 30.0 - 36.0 g/dL   RDW 12.5 11.5 - 15.5 %   Platelets 170 150 - 400 K/uL   nRBC 0.0 0.0 - 0.2 %   Neutrophils Relative % 38 %   Neutro Abs 2.6 1.7 - 7.7 K/uL   Lymphocytes Relative 51 %   Lymphs Abs 3.5 0.7 - 4.0 K/uL   Monocytes Relative 8 %   Monocytes Absolute 0.5 0.1 - 1.0 K/uL   Eosinophils Relative 2 %   Eosinophils Absolute 0.1 0.0 - 0.5 K/uL   Basophils Relative 1 %   Basophils Absolute 0.0 0.0 - 0.1 K/uL   Immature Granulocytes 0 %   Abs Immature Granulocytes 0.02 0.00 - 0.07 K/uL  Lipase, blood     Status: Abnormal   Collection Time: 05/22/19 11:51 PM  Result Value Ref Range   Lipase 90 (H) 11 - 51 U/L  Comprehensive metabolic panel      Status: Abnormal   Collection Time: 05/22/19 11:51 PM  Result Value Ref Range   Sodium 133 (L) 135 - 145 mmol/L   Potassium 4.2 3.5 - 5.1 mmol/L   Chloride 100 98 - 111 mmol/L   CO2 22 22 - 32 mmol/L   Glucose, Bld 121 (H) 70 - 99 mg/dL   BUN 39 (H) 8 - 23 mg/dL   Creatinine, Ser 2.11 (H) 0.44 - 1.00 mg/dL   Calcium 9.3 8.9 - 10.3 mg/dL   Total Protein 9.1 (H) 6.5 - 8.1 g/dL   Albumin 3.7 3.5 - 5.0 g/dL   AST 43 (H) 15 - 41 U/L   ALT 21 0 - 44 U/L   Alkaline Phosphatase 102 38 - 126 U/L   Total Bilirubin 0.6 0.3 - 1.2 mg/dL   GFR calc non Af Amer 21 (L) >60 mL/min   GFR calc Af Amer 25 (L) >60 mL/min   Anion gap 11 5 - 15  Troponin I (High Sensitivity)     Status: Abnormal   Collection Time: 05/22/19 11:51 PM  Result Value Ref Range   Troponin I (High Sensitivity) 78 (H) <18 ng/L  Protime-INR     Status: None   Collection Time: 05/23/19  2:17 AM  Result Value Ref Range   Prothrombin Time 12.3 11.4 - 15.2 seconds   INR 0.9 0.8 - 1.2  APTT     Status: None   Collection Time: 05/23/19  2:17 AM  Result Value Ref Range   aPTT 29 24 - 36 seconds  Respiratory Panel by RT PCR (Flu A&B, Covid) - Nasopharyngeal Swab  Status: None   Collection Time: 05/23/19  2:38 AM   Specimen: Nasopharyngeal Swab  Result Value Ref Range   SARS Coronavirus 2 by RT PCR NEGATIVE NEGATIVE   Influenza A by PCR NEGATIVE NEGATIVE   Influenza B by PCR NEGATIVE NEGATIVE  Basic metabolic panel     Status: Abnormal   Collection Time: 05/23/19  4:55 AM  Result Value Ref Range   Sodium 134 (L) 135 - 145 mmol/L   Potassium 4.5 3.5 - 5.1 mmol/L   Chloride 104 98 - 111 mmol/L   CO2 26 22 - 32 mmol/L   Glucose, Bld 188 (H) 70 - 99 mg/dL   BUN 36 (H) 8 - 23 mg/dL   Creatinine, Ser 1.75 (H) 0.44 - 1.00 mg/dL   Calcium 8.6 (L) 8.9 - 10.3 mg/dL   GFR calc non Af Amer 27 (L) >60 mL/min   GFR calc Af Amer 31 (L) >60 mL/min   Anion gap 4 (L) 5 - 15  Lipid panel     Status: None   Collection Time: 05/23/19   4:55 AM  Result Value Ref Range   Cholesterol 120 0 - 200 mg/dL   Triglycerides 41 <150 mg/dL   HDL 41 >40 mg/dL   Total CHOL/HDL Ratio 2.9 RATIO   VLDL 8 0 - 40 mg/dL   LDL Cholesterol 71 0 - 99 mg/dL  CBC     Status: Abnormal   Collection Time: 05/23/19  4:55 AM  Result Value Ref Range   WBC 6.6 4.0 - 10.5 K/uL   RBC 3.53 (L) 3.87 - 5.11 MIL/uL   Hemoglobin 11.1 (L) 12.0 - 15.0 g/dL   HCT 32.9 (L) 36.0 - 46.0 %   MCV 93.2 80.0 - 100.0 fL   MCH 31.4 26.0 - 34.0 pg   MCHC 33.7 30.0 - 36.0 g/dL   RDW 12.6 11.5 - 15.5 %   Platelets 170 150 - 400 K/uL   nRBC 0.0 0.0 - 0.2 %  Lipase, blood     Status: Abnormal   Collection Time: 05/23/19  4:55 AM  Result Value Ref Range   Lipase 62 (H) 11 - 51 U/L  Heparin level (unfractionated)     Status: Abnormal   Collection Time: 05/23/19  9:59 AM  Result Value Ref Range   Heparin Unfractionated 1.90 (H) 0.30 - 0.70 IU/mL  Troponin I (High Sensitivity)     Status: Abnormal   Collection Time: 05/23/19  9:59 AM  Result Value Ref Range   Troponin I (High Sensitivity) 22,265 (HH) <18 ng/L   CT ABDOMEN PELVIS WO CONTRAST  Result Date: 05/23/2019 CLINICAL DATA:  Nausea and vomiting. Epigastric abdominal pain. Elevated lipase, pancreatitis suspected. Chronic kidney disease with acute kidney injury. EXAM: CT ABDOMEN AND PELVIS WITHOUT CONTRAST TECHNIQUE: Multidetector CT imaging of the abdomen and pelvis was performed following the standard protocol without IV contrast. COMPARISON:  None. FINDINGS: Lower chest: Scattered linear atelectasis. No pleural fluid. Small hiatal hernia. Hepatobiliary: Low-density in the subcapsular right lobe appears linear and likely represents diaphragmatic undulation rather than focal lesion. Gallbladder physiologically distended, no calcified stone. No biliary dilatation. Pancreas: Mild peripancreatic fat stranding about the pancreatic head, series 2, image 28. No focal peripancreatic fluid collection. No ductal dilatation.  Spleen: Normal in size without focal abnormality. Adrenals/Urinary Tract: No adrenal nodule. No hydronephrosis or perinephric edema. Suspected parapelvic cysts in the left kidney. No renal stones. Urinary bladder is physiologically distended. No bladder wall thickening. Stomach/Bowel: Small hiatal hernia. Stomach  is nondistended. Normal positioning of the ligament of Treitz. No small bowel obstruction or inflammation. Possible diminutive appendix, regardless, no appendicitis. Moderate volume of stool throughout the colon. Mild tortuosity of the splenic flexure. Mild left colonic diverticulosis, most prominent in the sigmoid. No acute diverticulitis. Vascular/Lymphatic: Aortic and branch atherosclerosis. No aortic aneurysm. No adenopathy. Reproductive: Atrophic uterus, normal for age. No adnexal mass. Other: No significant ascites or free fluid. No free air. No focal fluid collection. Musculoskeletal: Degenerative change in the spine and sacroiliac joints. There are no acute or suspicious osseous abnormalities. 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 IMPRESSION: 1. Mild peripancreatic fat stranding about the pancreatic head consistent with acute pancreatitis. No peripancreatic fluid collection. 2. Colonic diverticulosis without diverticulitis. 3. Small hiatal hernia. Aortic Atherosclerosis (ICD10-I70.0). Electronically Signed   By: Keith Rake M.D.   On: 05/23/2019 01:40     ASSESSMENT AND PLAN: Patient in no acute distress on examination. Patient currently denying chest pain and when describing the pain it appears more epigastric. Patient has been given morphine and dilaudid and pain has resolved. Patient with current troponin of 78. Patient appears to have pancreatitis, but plan to continue heparin drip and trend troponin for possible ACS.     Upon reviewing patients labs this afternoon, patient found to have a troponin increased to 22,265. Patient most likely with an NSTEMI. Repeat EKG and  echocardiogram ordered.  Mohsin Crum A

## 2019-05-23 NOTE — ED Notes (Signed)
Blue tube sent to lab 

## 2019-05-23 NOTE — ED Notes (Signed)
Pt denies CP/SHOB/dizziness a this time.

## 2019-05-23 NOTE — ED Notes (Signed)
Patient assisted to bathroom at this time.

## 2019-05-23 NOTE — Progress Notes (Signed)
ANTICOAGULATION CONSULT NOTE - Initial Consult  Pharmacy Consult for Heparin Indication: chest pain/ACS  Allergies  Allergen Reactions  . Iodine     Seizure  Betadine ok Other reaction(s): Other (See Comments) Sneezing Seizure  Betadine ok  . Penicillins Other (See Comments) and Rash    Has patient had a PCN reaction causing immediate rash, facial/tongue/throat swelling, SOB or lightheadedness with hypotension: Unknown Has patient had a PCN reaction causing severe rash involving mucus membranes or skin necrosis: Unknown Has patient had a PCN reaction that required hospitalization: Unknown Has patient had a PCN reaction occurring within the last 10 years: Unknown If all of the above answers are "NO", then may proceed with Cephalosporin use.  Other reaction(s): Other (See Comments) Has patient had a PCN reaction causing immediate rash, facial/tongue/throat swelling, SOB or lightheadedness with hypotension: Unknown Has patient had a PCN reaction causing severe rash involving mucus membranes or skin necrosis: Unknown Has patient had a PCN reaction that required hospitalization: Unknown Has patient had a PCN reaction occurring within the last 10 years: Unknown If all of the above answers are "NO", then may proceed with Cephalosporin use. Has patient had a PCN reaction causing immediate rash, facial/tongue/throat swelling, SOB or lightheadedness with hypotension: Unknown Has patient had a PCN reaction causing severe rash involving mucus membranes or skin necrosis: Unknown Has patient had a PCN reaction that required hospitalization: Unknown Has patient had a PCN reaction occurring within the last 10 years: Unknown If all of the above answers are "NO", then may proceed with Cephalosporin use.    Patient Measurements: Height: 5' (152.4 cm) Weight: 157 lb 3.2 oz (71.3 kg) IBW/kg (Calculated) : 45.5 HEPARIN DW (KG): 61.2  Vital Signs: Temp: 97.6 F (36.4 C) (02/17 1330) Temp  Source: Oral (02/17 1330) BP: 112/69 (02/17 1330) Pulse Rate: 75 (02/17 1330)  Labs: Recent Labs    05/22/19 2351 05/23/19 0217 05/23/19 0455 05/23/19 0959  HGB 12.3  --  11.1*  --   HCT 36.4  --  32.9*  --   PLT 170  --  170  --   APTT  --  29  --   --   LABPROT  --  12.3  --   --   INR  --  0.9  --   --   HEPARINUNFRC  --   --   --  1.90*  CREATININE 2.11*  --  1.75*  --   TROPONINIHS 78*  --   --  22,265*    Estimated Creatinine Clearance: 21.8 mL/min (A) (by C-G formula based on SCr of 1.75 mg/dL (H)).   Medical History: Past Medical History:  Diagnosis Date  . Arthritis   . Bladder cancer ([place])   . Bladder cancer ([place])   . Cancer ([place])    multiple myeloma  . Chronic kidney disease   . Chronic kidney disease   . Dizziness   . GERD (gastroesophageal reflux disease)   . Hypertension   . Multiple myeloma ([place])   . Stroke ([place])    tia x 2  . Tubular adenoma of colon     Medications:  Medications Prior to Admission  Medication Sig Dispense Refill Last Dose  . losartan (COZAAR) 25 MG tablet Take 25 mg by mouth daily.   05/22/2019 at Unknown time  . meclizine (ANTIVERT) 25 MG tablet Take 25 mg by mouth 3 (three) times daily as needed.   1 prn at prn  . metoprolol succinate (TOPROL-XL) 25 MG 24  hr tablet Take 50 mg by mouth daily.    05/22/2019 at Unknown time  . nitroGLYCERIN (NITROSTAT) 0.3 MG SL tablet Place 1 tablet under the tongue as needed.   prn at prn  . pantoprazole (PROTONIX) 40 MG tablet Take 40 mg by mouth daily.   05/22/2019 at Unknown time  . rosuvastatin (CRESTOR) 10 MG tablet Take 10 mg by mouth daily.   05/22/2019 at Unknown time  . isosorbide dinitrate (ISORDIL) 30 MG tablet Take 30 mg by mouth daily.   Not Taking at Unknown time  . sucralfate (CARAFATE) 1 g tablet Take 1 g by mouth 4 (four) times daily.   Not Taking at Unknown time  . traZODone (DESYREL) 50 MG tablet Take 1 tablet by mouth at bedtime as needed.   Not Taking at Unknown time     Assessment: Heparin protocol initiated for ACS.  Baseline labs ordered.  No anticoagulants PTA per current med list.   Goal of Therapy:  Heparin level 0.3-0.7 units/ml Monitor platelets by anticoagulation protocol: Yes   Plan:  2/16 10:00 HL 1.90, supratherapeutic. Patient was not taking anticoagulant prior to admission. Spoke to RN about lab draw and if Heparin was paused for lab draw, she said it was but only for about 5 minutes. Will hold Heparin and redraw HL, also ordered aPTT. Will assess HL when available and adjust rate accordingly, will leave Heparin off until level returns.  Heparin was stopped at 12:16 but labs were not drawn. Heparin has been off for ~1hr 62mn. Doubt that HL would be as accurate at this point. Will go ahead and restart Heparin at decreased rate of 700 units/hr. Will check a HL and aPTT in 8 hours. Daily CBC while on Heparin drip.  LPaulina Fusi PharmD, BCPS 05/23/2019 1:35 PM

## 2019-05-23 NOTE — ED Notes (Signed)
Pt assisted to toilet. Pt then assisted back to bed. Pt placed back on monitor. Pt states she was just vomiting. Will inform RN

## 2019-05-23 NOTE — ED Notes (Signed)
Transporting pt to [id number]

## 2019-05-23 NOTE — Progress Notes (Signed)
ANTICOAGULATION CONSULT NOTE - Initial Consult  Pharmacy Consult for Heparin Indication: chest pain/ACS  Allergies  Allergen Reactions  . Iodine     Seizure  Betadine ok Other reaction(s): Other (See Comments) Sneezing Seizure  Betadine ok  . Penicillins Other (See Comments) and Rash    Has patient had a PCN reaction causing immediate rash, facial/tongue/throat swelling, SOB or lightheadedness with hypotension: Unknown Has patient had a PCN reaction causing severe rash involving mucus membranes or skin necrosis: Unknown Has patient had a PCN reaction that required hospitalization: Unknown Has patient had a PCN reaction occurring within the last 10 years: Unknown If all of the above answers are "NO", then may proceed with Cephalosporin use.  Other reaction(s): Other (See Comments) Has patient had a PCN reaction causing immediate rash, facial/tongue/throat swelling, SOB or lightheadedness with hypotension: Unknown Has patient had a PCN reaction causing severe rash involving mucus membranes or skin necrosis: Unknown Has patient had a PCN reaction that required hospitalization: Unknown Has patient had a PCN reaction occurring within the last 10 years: Unknown If all of the above answers are "NO", then may proceed with Cephalosporin use. Has patient had a PCN reaction causing immediate rash, facial/tongue/throat swelling, SOB or lightheadedness with hypotension: Unknown Has patient had a PCN reaction causing severe rash involving mucus membranes or skin necrosis: Unknown Has patient had a PCN reaction that required hospitalization: Unknown Has patient had a PCN reaction occurring within the last 10 years: Unknown If all of the above answers are "NO", then may proceed with Cephalosporin use.    Patient Measurements:      Vital Signs: Temp: 99 F (37.2 C) (02/16 2341) Temp Source: Oral (02/16 2341) BP: 161/87 (02/17 0200) Pulse Rate: 87 (02/17 0200)  Labs: Recent Labs   05/22/19 2351  HGB 12.3  HCT 36.4  PLT 170  CREATININE 2.11*  TROPONINIHS 78*    Estimated Creatinine Clearance: 18 mL/min (A) (by C-G formula based on SCr of 2.11 mg/dL (H)).   Medical History: Past Medical History:  Diagnosis Date  . Arthritis   . Bladder cancer ([place])   . Bladder cancer ([place])   . Cancer ([place])    multiple myeloma  . Chronic kidney disease   . Chronic kidney disease   . Dizziness   . GERD (gastroesophageal reflux disease)   . Hypertension   . Multiple myeloma ([place])   . Stroke ([place])    tia x 2  . Tubular adenoma of colon     Medications:  (Not in a hospital admission)   Assessment: Heparin protocol initiated for ACS.  Baseline labs ordered.  No anticoagulants PTA per current med list.   Goal of Therapy:  Heparin level 0.3-0.7 units/ml Monitor platelets by anticoagulation protocol: Yes   Plan:  Heparin 4000 units bolus x 1 then infusion at 850 units/hr, check HL in ~ 8 hours  Hart Robinsons A 05/23/2019,2:43 AM

## 2019-05-23 NOTE — ED Notes (Signed)
Thi RN assisted pt to toilet. Pt able to ambulate to toilet to void with a steady gait. Pt assisted back to bed. Monitor placed.

## 2019-05-23 NOTE — Progress Notes (Signed)
ANTICOAGULATION CONSULT NOTE - Initial Consult  Pharmacy Consult for Heparin Indication: chest pain/ACS  Allergies  Allergen Reactions  . Iodine     Seizure  Betadine ok Other reaction(s): Other (See Comments) Sneezing Seizure  Betadine ok  . Penicillins Other (See Comments) and Rash    Has patient had a PCN reaction causing immediate rash, facial/tongue/throat swelling, SOB or lightheadedness with hypotension: Unknown Has patient had a PCN reaction causing severe rash involving mucus membranes or skin necrosis: Unknown Has patient had a PCN reaction that required hospitalization: Unknown Has patient had a PCN reaction occurring within the last 10 years: Unknown If all of the above answers are "NO", then may proceed with Cephalosporin use.  Other reaction(s): Other (See Comments) Has patient had a PCN reaction causing immediate rash, facial/tongue/throat swelling, SOB or lightheadedness with hypotension: Unknown Has patient had a PCN reaction causing severe rash involving mucus membranes or skin necrosis: Unknown Has patient had a PCN reaction that required hospitalization: Unknown Has patient had a PCN reaction occurring within the last 10 years: Unknown If all of the above answers are "NO", then may proceed with Cephalosporin use. Has patient had a PCN reaction causing immediate rash, facial/tongue/throat swelling, SOB or lightheadedness with hypotension: Unknown Has patient had a PCN reaction causing severe rash involving mucus membranes or skin necrosis: Unknown Has patient had a PCN reaction that required hospitalization: Unknown Has patient had a PCN reaction occurring within the last 10 years: Unknown If all of the above answers are "NO", then may proceed with Cephalosporin use.    Patient Measurements:      Vital Signs: Temp: 97.6 F (36.4 C) (02/17 0723) Temp Source: Oral (02/17 0723) BP: 119/85 (02/17 1146) Pulse Rate: 87 (02/17 1146)  Labs: Recent Labs   05/22/19 2351 05/23/19 0217 05/23/19 0455 05/23/19 0959  HGB 12.3  --  11.1*  --   HCT 36.4  --  32.9*  --   PLT 170  --  170  --   APTT  --  29  --   --   LABPROT  --  12.3  --   --   INR  --  0.9  --   --   HEPARINUNFRC  --   --   --  1.90*  CREATININE 2.11*  --  1.75*  --   TROPONINIHS 78*  --   --  22,265*    Estimated Creatinine Clearance: 21.7 mL/min (A) (by C-G formula based on SCr of 1.75 mg/dL (H)).   Medical History: Past Medical History:  Diagnosis Date  . Arthritis   . Bladder cancer ([place])   . Bladder cancer ([place])   . Cancer ([place])    multiple myeloma  . Chronic kidney disease   . Chronic kidney disease   . Dizziness   . GERD (gastroesophageal reflux disease)   . Hypertension   . Multiple myeloma ([place])   . Stroke ([place])    tia x 2  . Tubular adenoma of colon     Medications:  (Not in a hospital admission)   Assessment: Heparin protocol initiated for ACS.  Baseline labs ordered.  No anticoagulants PTA per current med list.   Goal of Therapy:  Heparin level 0.3-0.7 units/ml Monitor platelets by anticoagulation protocol: Yes   Plan:  2/16 10:00 HL 1.90, supratherapeutic. Patient was not taking anticoagulant prior to admission. Spoke to RN about lab draw and if Heparin was paused for lab draw, she said it was but only for  about 5 minutes. Will hold Heparin and redraw HL, also ordered aPTT. Will assess HL when available and adjust rate accordingly, will leave Heparin off until level returns.  Paulina Fusi, PharmD, BCPS 05/23/2019 11:57 AM

## 2019-05-23 NOTE — ED Notes (Signed)
Pt taken to CT.

## 2019-05-24 ENCOUNTER — Inpatient Hospital Stay: Admit: 2019-05-24 | Payer: Medicare Other

## 2019-05-24 ENCOUNTER — Other Ambulatory Visit: Payer: Self-pay

## 2019-05-24 ENCOUNTER — Inpatient Hospital Stay
Admit: 2019-05-24 | Discharge: 2019-05-24 | Disposition: A | Discharge status: Home / Self Care | Payer: Medicare Other | Attending: Cardiovascular Disease | Admitting: Cardiovascular Disease

## 2019-05-24 ENCOUNTER — Encounter
Admission: EM | Disposition: A | Discharge status: Home / Self Care | Payer: Self-pay | Source: Home / Self Care | Attending: Internal Medicine

## 2019-05-24 ENCOUNTER — Encounter: Payer: Self-pay | Admitting: Family Medicine

## 2019-05-24 DIAGNOSIS — I214 Non-ST elevation (NSTEMI) myocardial infarction: Secondary | ICD-10-CM

## 2019-05-24 DIAGNOSIS — R1013 Epigastric pain: Secondary | ICD-10-CM

## 2019-05-24 DIAGNOSIS — R7989 Other specified abnormal findings of blood chemistry: Secondary | ICD-10-CM | POA: Diagnosis present

## 2019-05-24 DIAGNOSIS — R778 Other specified abnormalities of plasma proteins: Secondary | ICD-10-CM

## 2019-05-24 HISTORY — PX: CORONARY ANGIOGRAPHY: CATH118303

## 2019-05-24 HISTORY — PX: LEFT HEART CATH: CATH118248

## 2019-05-24 LAB — CBC
HCT: 29.4 % — ABNORMAL LOW (ref 36.0–46.0)
Hemoglobin: 9.8 g/dL — ABNORMAL LOW (ref 12.0–15.0)
MCH: 31.3 pg (ref 26.0–34.0)
MCHC: 33.3 g/dL (ref 30.0–36.0)
MCV: 93.9 fL (ref 80.0–100.0)
Platelets: 157 10*3/uL (ref 150–400)
RBC: 3.13 MIL/uL — ABNORMAL LOW (ref 3.87–5.11)
RDW: 12.9 % (ref 11.5–15.5)
WBC: 5.5 10*3/uL (ref 4.0–10.5)
nRBC: 0 % (ref 0.0–0.2)

## 2019-05-24 LAB — ECHOCARDIOGRAM COMPLETE
Height: 60 in
Weight: 2515.01 oz

## 2019-05-24 LAB — APTT: aPTT: >160 seconds (ref 24–36)

## 2019-05-24 LAB — LIPASE, BLOOD: Lipase: 48 U/L (ref 11–51)

## 2019-05-24 LAB — HEPARIN LEVEL (UNFRACTIONATED): Heparin Unfractionated: 1.26 IU/mL — ABNORMAL HIGH (ref 0.30–0.70)

## 2019-05-24 SURGERY — LEFT HEART CATH
Anesthesia: Moderate Sedation

## 2019-05-24 MED ORDER — IOHEXOL 300 MG/ML  SOLN
INTRAMUSCULAR | Status: DC | PRN
  Administered 2019-05-24: 50 mL

## 2019-05-24 MED ORDER — HYDRALAZINE HCL 20 MG/ML IJ SOLN: 10.0000 mg | INTRAMUSCULAR | Status: AC | PRN

## 2019-05-24 MED ORDER — MIDAZOLAM HCL 2 MG/2ML IJ SOLN
INTRAMUSCULAR | Status: AC
  Filled 2019-05-24: qty 2

## 2019-05-24 MED ORDER — RANOLAZINE ER 500 MG PO TB12: 500.0000 mg | ORAL_TABLET | Freq: Two times a day (BID) | ORAL | 0 refills | Status: DC

## 2019-05-24 MED ORDER — HEPARIN (PORCINE) IN NACL 1000-0.9 UT/500ML-% IV SOLN
INTRAVENOUS | Status: AC
  Filled 2019-05-24: qty 1000

## 2019-05-24 MED ORDER — DIPHENHYDRAMINE HCL 50 MG/ML IJ SOLN
INTRAMUSCULAR | Status: DC | PRN
  Administered 2019-05-24: 25 mg via INTRAVENOUS

## 2019-05-24 MED ORDER — CLOPIDOGREL BISULFATE 75 MG PO TABS: 75.0000 mg | ORAL_TABLET | Freq: Every day | ORAL | 0 refills | Status: DC

## 2019-05-24 MED ORDER — SODIUM CHLORIDE 0.9 % IV SOLN: 250.0000 mL | INTRAVENOUS | Status: DC | PRN

## 2019-05-24 MED ORDER — ACETAMINOPHEN 325 MG PO TABS: 650.0000 mg | ORAL_TABLET | ORAL | Status: DC | PRN

## 2019-05-24 MED ORDER — SODIUM CHLORIDE 0.9 % WEIGHT BASED INFUSION
1.0000 mL/kg/h | INTRAVENOUS | Status: DC
  Administered 2019-05-24: 1 mL/kg/h via INTRAVENOUS

## 2019-05-24 MED ORDER — FENTANYL CITRATE (PF) 100 MCG/2ML IJ SOLN
INTRAMUSCULAR | Status: DC | PRN
  Administered 2019-05-24: 25 ug via INTRAVENOUS

## 2019-05-24 MED ORDER — HEPARIN (PORCINE) IN NACL 1000-0.9 UT/500ML-% IV SOLN
INTRAVENOUS | Status: DC | PRN
  Administered 2019-05-24: 500 mL

## 2019-05-24 MED ORDER — FAMOTIDINE 20 MG PO TABS
ORAL_TABLET | ORAL | Status: AC
  Filled 2019-05-24: qty 1

## 2019-05-24 MED ORDER — RANOLAZINE ER 500 MG PO TB12
500.0000 mg | ORAL_TABLET | Freq: Two times a day (BID) | ORAL | Status: DC
  Administered 2019-05-24: 500 mg via ORAL
  Filled 2019-05-24 (×3): qty 1

## 2019-05-24 MED ORDER — METHYLPREDNISOLONE SODIUM SUCC 125 MG IJ SOLR
INTRAMUSCULAR | Status: AC
  Filled 2019-05-24: qty 2

## 2019-05-24 MED ORDER — HEPARIN (PORCINE) 25000 UT/250ML-% IV SOLN
500.0000 [IU]/h | INTRAVENOUS | Status: DC
  Administered 2019-05-24: 500 [IU]/h via INTRAVENOUS

## 2019-05-24 MED ORDER — LABETALOL HCL 5 MG/ML IV SOLN: 10.0000 mg | INTRAVENOUS | Status: AC | PRN

## 2019-05-24 MED ORDER — FAMOTIDINE 10 MG PO TABS
ORAL_TABLET | ORAL | Status: DC | PRN
  Administered 2019-05-24: 20 mg via ORAL

## 2019-05-24 MED ORDER — ASPIRIN EC 81 MG PO TBEC: 81.0000 mg | DELAYED_RELEASE_TABLET | Freq: Every day | ORAL | Status: DC

## 2019-05-24 MED ORDER — FENTANYL CITRATE (PF) 100 MCG/2ML IJ SOLN
INTRAMUSCULAR | Status: AC
  Filled 2019-05-24: qty 2

## 2019-05-24 MED ORDER — METHYLPREDNISOLONE SODIUM SUCC 125 MG IJ SOLR
INTRAMUSCULAR | Status: DC | PRN
  Administered 2019-05-24: 125 mg via INTRAVENOUS

## 2019-05-24 MED ORDER — DIPHENHYDRAMINE HCL 50 MG/ML IJ SOLN
INTRAMUSCULAR | Status: AC
  Filled 2019-05-24: qty 1

## 2019-05-24 MED ORDER — SODIUM CHLORIDE 0.9% FLUSH: 3.0000 mL | Freq: Two times a day (BID) | INTRAVENOUS | Status: DC

## 2019-05-24 MED ORDER — MIDAZOLAM HCL 2 MG/2ML IJ SOLN
INTRAMUSCULAR | Status: DC | PRN
  Administered 2019-05-24: 1 mg via INTRAVENOUS

## 2019-05-24 MED ORDER — ONDANSETRON HCL 4 MG/2ML IJ SOLN: 4.0000 mg | Freq: Four times a day (QID) | INTRAMUSCULAR | Status: DC | PRN

## 2019-05-24 MED ORDER — SODIUM CHLORIDE 0.9% FLUSH: 3.0000 mL | INTRAVENOUS | Status: DC | PRN

## 2019-05-24 SURGICAL SUPPLY — 9 items
CATH INFINITI 5FR ANG PIGTAIL (CATHETERS) ×3 IMPLANT
CATH INFINITI 5FR JL4 (CATHETERS) ×3 IMPLANT
CATH INFINITI JR4 5F (CATHETERS) ×3 IMPLANT
DEVICE CLOSURE MYNXGRIP 5F (Vascular Products) ×3 IMPLANT
KIT MANI 3VAL PERCEP (MISCELLANEOUS) ×3 IMPLANT
NEEDLE PERC 18GX7CM (NEEDLE) ×3 IMPLANT
PACK CARDIAC CATH (CUSTOM PROCEDURE TRAY) ×3 IMPLANT
SHEATH AVANTI 5FR X 11CM (SHEATH) ×3 IMPLANT
WIRE GUIDERIGHT .035X150 (WIRE) ×3 IMPLANT

## 2019-05-24 NOTE — Progress Notes (Signed)
[facility] for Heparin Indication: chest pain/ACS  Allergies  Allergen Reactions  . Iodine     Seizure  Betadine ok Other reaction(s): Other (See Comments) Sneezing Seizure  Betadine ok  . Penicillins Other (See Comments) and Rash    Has patient had a PCN reaction causing immediate rash, facial/tongue/throat swelling, SOB or lightheadedness with hypotension: Unknown Has patient had a PCN reaction causing severe rash involving mucus membranes or skin necrosis: Unknown Has patient had a PCN reaction that required hospitalization: Unknown Has patient had a PCN reaction occurring within the last 10 years: Unknown If all of the above answers are "NO", then may proceed with Cephalosporin use.  Other reaction(s): Other (See Comments) Has patient had a PCN reaction causing immediate rash, facial/tongue/throat swelling, SOB or lightheadedness with hypotension: Unknown Has patient had a PCN reaction causing severe rash involving mucus membranes or skin necrosis: Unknown Has patient had a PCN reaction that required hospitalization: Unknown Has patient had a PCN reaction occurring within the last 10 years: Unknown If all of the above answers are "NO", then may proceed with Cephalosporin use. Has patient had a PCN reaction causing immediate rash, facial/tongue/throat swelling, SOB or lightheadedness with hypotension: Unknown Has patient had a PCN reaction causing severe rash involving mucus membranes or skin necrosis: Unknown Has patient had a PCN reaction that required hospitalization: Unknown Has patient had a PCN reaction occurring within the last 10 years: Unknown If all of the above answers are "NO", then may proceed with Cephalosporin use.    Patient Measurements: Height: 5' (152.4 cm) Weight: 157 lb 3.2 oz (71.3 kg) IBW/kg (Calculated) : 45.5 HEPARIN DW (KG): 61.2  Vital Signs: Temp: 98.5 F (36.9 C) (02/17 2343) Temp Source: Oral (02/17  2343) BP: 117/65 (02/17 2343) Pulse Rate: 71 (02/17 2343)  Labs: Recent Labs    05/22/19 2351 05/22/19 2351 05/23/19 0217 05/23/19 0455 05/23/19 0959 05/23/19 1947 05/23/19 2133 05/23/19 2331  HGB 12.3   < >  --  11.1*  --  10.1*  --   --   HCT 36.4  --   --  32.9*  --  30.8*  --   --   PLT 170  --   --  170  --  151  --   --   APTT  --   --  29  --   --   --  >160* >160*  LABPROT  --   --  12.3  --   --  13.6  --   --   INR  --   --  0.9  --   --  1.1  --   --   HEPARINUNFRC  --   --   --   --  1.90*  --  1.18* 1.26*  CREATININE 2.11*  --   --  1.75*  --  1.66*  --   --   TROPONINIHS 78*  --   --   --  22,265*  --   --   --    < > = values in this interval not displayed.    Estimated Creatinine Clearance: 23 mL/min (A) (by C-G formula based on SCr of 1.66 mg/dL (H)).   Medical History: Past Medical History:  Diagnosis Date  . Arthritis   . Bladder cancer ([place])   . Bladder cancer ([place])   . Cancer ([place])    multiple myeloma  . Chronic kidney disease   . Chronic kidney disease   .  Dizziness   . GERD (gastroesophageal reflux disease)   . Hypertension   . Multiple myeloma ([place])   . Stroke ([place])    tia x 2  . Tubular adenoma of colon     Medications:  Medications Prior to Admission  Medication Sig Dispense Refill Last Dose  . losartan (COZAAR) 25 MG tablet Take 25 mg by mouth daily.   05/22/2019 at Unknown time  . meclizine (ANTIVERT) 25 MG tablet Take 25 mg by mouth 3 (three) times daily as needed.   1 prn at prn  . metoprolol succinate (TOPROL-XL) 25 MG 24 hr tablet Take 50 mg by mouth daily.    05/22/2019 at Unknown time  . nitroGLYCERIN (NITROSTAT) 0.3 MG SL tablet Place 1 tablet under the tongue as needed.   prn at prn  . pantoprazole (PROTONIX) 40 MG tablet Take 40 mg by mouth daily.   05/22/2019 at Unknown time  . rosuvastatin (CRESTOR) 10 MG tablet Take 10 mg by mouth daily.   05/22/2019 at Unknown time  . isosorbide dinitrate (ISORDIL) 30 MG tablet Take 30 mg  by mouth daily.   Not Taking at Unknown time  . sucralfate (CARAFATE) 1 g tablet Take 1 g by mouth 4 (four) times daily.   Not Taking at Unknown time  . traZODone (DESYREL) 50 MG tablet Take 1 tablet by mouth at bedtime as needed.   Not Taking at Unknown time    Assessment: Heparin protocol initiated for ACS.  Baseline labs ordered.  No anticoagulants PTA per current med list.   Goal of Therapy:  Heparin level 0.3-0.7 units/ml Monitor platelets by anticoagulation protocol: Yes   Plan:  2/16 10:00 HL 1.90, supratherapeutic. Patient was not taking anticoagulant prior to admission. Spoke to RN about lab draw and if Heparin was paused for lab draw, she said it was but only for about 5 minutes. Will hold Heparin and redraw HL, also ordered aPTT. Will assess HL when available and adjust rate accordingly, will leave Heparin off until level returns.  Heparin was stopped at 12:16 but labs were not drawn. Heparin has been off for ~1hr 85mn. Doubt that HL would be as accurate at this point. Will go ahead and restart Heparin at decreased rate of 700 units/hr. Will check a HL and aPTT in 8 hours. Daily CBC while on Heparin drip.  2/17 2331 HL 1.26, aPTT > 160 which was repeated and still > 160.  D/W nurse.  Will hold heparin x 2 hours then resume Heparin at reduced rate of 500 units/hr.  Will recheck HL and aPTT in ~ 8 hours.  CBC is worse, will continue to monitor for s/sx bleeding complications.   SHart Robinsons PharmD Clinical Pharmacist  05/24/2019 12:12 AM

## 2019-05-24 NOTE — Discharge Summary (Signed)
Physician Discharge Summary  Julie Khan MAY:[id number] DOB: June 10, 1934 DOA: 05/22/2019  PCP: Jodi Marble, MD  Admit date: 05/22/2019 Discharge date: 05/24/2019  Discharge disposition: Home   Recommendations for Outpatient Follow-Up:   Follow up with PCP and cardiologist in 1 to 2 weeks  Discharge Diagnosis:   Principal Problem:   Elevated troponin Active Problems:   Epigastric pain    Discharge Condition: Stable.  Diet recommendation: Low-salt diet  Code status: Full code.    Hospital Course:   Julie Khan is an 85 year old woman with medical history significant for hypertension, multiple myeloma, stroke, CKD stage IIIa, GERD, who presented to the hospital with acute onset of epigastric pain.  Initial troponin was elevated at 78 and repeat troponin was 22,265.  She was admitted to telemetry room.  She was treated with IV heparin infusion.  Dr. Humphrey Rolls, cardiologist, was consulted and patient underwent cardiac catheterization which showed mid LAD lesion with 50% stenosis and severe calcification of the vessel.  LV gram was not done because of renal insufficiency.  Dr. Humphrey Rolls, cardiologist, recommended medical therapy with aspirin, Plavix and Ranexa.  Patient was advised to continue with Protonix because of report of previous stomach irritation from aspirin.  Her condition has improved.  Epigastric pain/lower substernal chest pain has resolved and she is deemed stable for discharge.  Discharge plan was discussed with Dr. Humphrey Rolls, cardiologist who said patient was stable for discharge from his standpoint.  He attributed the elevated troponins to demand ischemia he said acute NSTEMI was unlikely given findings on cardiac catheterization.  Of note, patient was admitted as an inpatient and was suspected to stay at least 2 midnights.  However, his condition improved rather quickly and there was no need for her to stay for 2 midnights.    Discharge Exam:   Vitals:   05/24/19 1030 05/24/19 1554  BP: 130/80 116/63  Pulse: 63 82  Resp: 12 17  Temp:  98.4 F (36.9 C)  SpO2: 93% 95%   Vitals:   05/24/19 1000 05/24/19 1015 05/24/19 1030 05/24/19 1554  BP: 127/72 132/67 130/80 116/63  Pulse: 68 65 63 82  Resp: 17 14 12 17   Temp:    98.4 F (36.9 C)  TempSrc:      SpO2: 93% 93% 93% 95%  Weight:      Height:         GEN: NAD SKIN: No rash EYES: EOMI ENT: MMM CV: RRR PULM: CTA B ABD: soft, ND, NT, +BS CNS: AAO x 3, non focal EXT: No edema or tenderness   The results of significant diagnostics from this hospitalization (including imaging, microbiology, ancillary and laboratory) are listed below for reference.     Procedures and Diagnostic Studies:   CARDIAC CATHETERIZATION  Result Date: 05/24/2019  Mid LAD lesion is 50% stenosed.  Moderate CAD with 50% disease in the mid LAD.  Vessel is severely calcified and we will treat the patient medically.  LV gram was deferred due to renal insufficiency.   ECHOCARDIOGRAM COMPLETE  Result Date: 05/24/2019    ECHOCARDIOGRAM REPORT   Patient Name:   Julie Khan Date of Exam: 05/24/2019 Medical Rec #:  [id number]       Height:       60.0 in Accession #:    [id number]      Weight:       157.2 lb Date of Birth:  1935/02/25       BSA:  1.68 m Patient Age:    13 years        BP:           132/67 mmHg Patient Gender: F               HR:           65 bpm. Exam Location:  [facility] Procedure: 2D Echo, Cardiac Doppler and Color Doppler Indications:     NSTEMI 121.4  History:         Patient has no prior history of Echocardiogram examinations.                  Stroke; Risk Factors:Hypertension. Dizziness.  Sonographer:     Sherrie Sport RDCS (AE) Referring Phys:  [place] Diagnosing Phys: Neoma Laming MD IMPRESSIONS  1. Left ventricular ejection fraction, by estimation, is 60 to 65%. The left ventricle has normal function. The left ventricle has no regional wall motion abnormalities. Left ventricular  diastolic parameters are consistent with Grade I diastolic dysfunction (impaired relaxation).  2. Right ventricular systolic function is normal. The right ventricular size is normal. There is mildly elevated pulmonary artery systolic pressure.  3. The mitral valve is normal in structure and function. No evidence of mitral valve regurgitation. No evidence of mitral stenosis.  4. The aortic valve is normal in structure and function. Aortic valve regurgitation is not visualized. No aortic stenosis is present.  5. The inferior vena cava is normal in size with greater than 50% respiratory variability, suggesting right atrial pressure of 3 mmHg. FINDINGS  Left Ventricle: Left ventricular ejection fraction, by estimation, is 60 to 65%. The left ventricle has normal function. The left ventricle has no regional wall motion abnormalities. The left ventricular internal cavity size was normal in size. There is  no left ventricular hypertrophy. Left ventricular diastolic parameters are consistent with Grade I diastolic dysfunction (impaired relaxation). Right Ventricle: The right ventricular size is normal. No increase in right ventricular wall thickness. Right ventricular systolic function is normal. There is mildly elevated pulmonary artery systolic pressure. The tricuspid regurgitant velocity is 2.43  m/s, and with an assumed right atrial pressure of 10 mmHg, the estimated right ventricular systolic pressure is 92.3 mmHg. Left Atrium: Left atrial size was normal in size. Right Atrium: Right atrial size was normal in size. Pericardium: There is no evidence of pericardial effusion. Mitral Valve: The mitral valve is normal in structure and function. Normal mobility of the mitral valve leaflets. No evidence of mitral valve regurgitation. No evidence of mitral valve stenosis. Tricuspid Valve: The tricuspid valve is normal in structure. Tricuspid valve regurgitation is not demonstrated. No evidence of tricuspid stenosis. Aortic  Valve: The aortic valve is normal in structure and function. Aortic valve regurgitation is not visualized. No aortic stenosis is present. Aortic valve mean gradient measures 2.5 mmHg. Aortic valve peak gradient measures 4.6 mmHg. Aortic valve area, by VTI measures 2.59 cm. Pulmonic Valve: The pulmonic valve was normal in structure. Pulmonic valve regurgitation is not visualized. No evidence of pulmonic stenosis. Aorta: The aortic root is normal in size and structure. Venous: The inferior vena cava is normal in size with greater than 50% respiratory variability, suggesting right atrial pressure of 3 mmHg. IAS/Shunts: No atrial level shunt detected by color flow Doppler.  LEFT VENTRICLE PLAX 2D LVIDd:         4.36 cm  Diastology LVIDs:         2.90 cm  LV e'  lateral:   5.44 cm/s LV PW:         0.89 cm  LV E/e' lateral: 17.3 LV IVS:        0.89 cm  LV e' medial:    6.96 cm/s LVOT diam:     2.00 cm  LV E/e' medial:  13.5 LV SV:         67.23 ml LV SV Index:   30.36 LVOT Area:     3.14 cm  RIGHT VENTRICLE RV Basal diam:  3.30 cm RV S prime:     11.40 cm/s TAPSE (M-mode): 2.6 cm LEFT ATRIUM           Index       RIGHT ATRIUM           Index LA diam:      4.00 cm 2.37 cm/m  RA Area:     12.80 cm LA Vol (A2C): 81.6 ml 48.43 ml/m RA Volume:   28.60 ml  16.97 ml/m LA Vol (A4C): 34.7 ml 20.59 ml/m  AORTIC VALVE                   PULMONIC VALVE AV Area (Vmax):    2.64 cm    PV Vmax:        0.67 m/s AV Area (Vmean):   2.36 cm    PV Peak grad:   1.8 mmHg AV Area (VTI):     2.59 cm    RVOT Peak grad: 1 mmHg AV Vmax:           107.50 cm/s AV Vmean:          71.450 cm/s AV VTI:            0.260 m AV Peak Grad:      4.6 mmHg AV Mean Grad:      2.5 mmHg LVOT Vmax:         90.20 cm/s LVOT Vmean:        53.600 cm/s LVOT VTI:          0.214 m LVOT/AV VTI ratio: 0.82  AORTA Ao Root diam: 2.90 cm MITRAL VALVE               TRICUSPID VALVE MV Area (PHT): 4.06 cm    TR Peak grad:   23.6 mmHg MV Decel Time: 187 msec    TR Vmax:         243.00 cm/s MV E velocity: 94.10 cm/s MV A velocity: 86.30 cm/s  SHUNTS MV E/A ratio:  1.09        Systemic VTI:  0.21 m                            Systemic Diam: 2.00 cm Neoma Laming MD Electronically signed by Neoma Laming MD Signature Date/Time: 05/24/2019/11:15:00 AM    Final      Labs:   Basic Metabolic Panel: Recent Labs  Lab 05/22/19 2351 05/22/19 2351 05/23/19 0455 05/23/19 1947  NA 133*  --  134* 135  K 4.2   < > 4.5 4.2  CL 100  --  104 106  CO2 22  --  26 25  GLUCOSE 121*  --  188* 114*  BUN 39*  --  36* 25*  CREATININE 2.11*  --  1.75* 1.66*  CALCIUM 9.3  --  8.6* 8.5*   < > = values in this interval not displayed.   GFR Estimated  Creatinine Clearance: 23 mL/min (A) (by C-G formula based on SCr of 1.66 mg/dL (H)). Liver Function Tests: Recent Labs  Lab 05/22/19 2351  AST 43*  ALT 21  ALKPHOS 102  BILITOT 0.6  PROT 9.1*  ALBUMIN 3.7   Recent Labs  Lab 05/22/19 2351 05/23/19 0455 05/24/19 0356  LIPASE 90* 62* 48   No results for input(s): AMMONIA in the last 168 hours. Coagulation profile Recent Labs  Lab 05/23/19 0217 05/23/19 1947  INR 0.9 1.1    CBC: Recent Labs  Lab 05/22/19 2351 05/23/19 0455 05/23/19 1947 05/24/19 0356  WBC 6.8 6.6 5.6 5.5  NEUTROABS 2.6  --   --   --   HGB 12.3 11.1* 10.1* 9.8*  HCT 36.4 32.9* 30.8* 29.4*  MCV 91.9 93.2 93.1 93.9  PLT 170 170 151 157   Cardiac Enzymes: No results for input(s): CKTOTAL, CKMB, CKMBINDEX, TROPONINI in the last 168 hours. BNP: Invalid input(s): POCBNP CBG: No results for input(s): GLUCAP in the last 168 hours. D-Dimer No results for input(s): DDIMER in the last 72 hours. Hgb A1c Recent Labs    05/23/19 0455  HGBA1C 5.9*   Lipid Profile Recent Labs    05/23/19 0455  CHOL 120  HDL 41  LDLCALC 71  TRIG 41  CHOLHDL 2.9   Thyroid function studies No results for input(s): TSH, T4TOTAL, T3FREE, THYROIDAB in the last 72 hours.  Invalid input(s): FREET3 Anemia work up  No results for input(s): VITAMINB12, FOLATE, FERRITIN, TIBC, IRON, RETICCTPCT in the last 72 hours. Microbiology Recent Results (from the past 240 hour(s))  Respiratory Panel by RT PCR (Flu A&B, Covid) - Nasopharyngeal Swab     Status: None   Collection Time: 05/23/19  2:38 AM   Specimen: Nasopharyngeal Swab  Result Value Ref Range Status   SARS Coronavirus 2 by RT PCR NEGATIVE NEGATIVE Final    Comment: (NOTE) SARS-CoV-2 target nucleic acids are NOT DETECTED. The SARS-CoV-2 RNA is generally detectable in upper respiratoy specimens during the acute phase of infection. The lowest concentration of SARS-CoV-2 viral copies this assay can detect is 131 copies/mL. A negative result does not preclude SARS-Cov-2 infection and should not be used as the sole basis for treatment or other patient management decisions. A negative result may occur with  improper specimen collection/handling, submission of specimen other than nasopharyngeal swab, presence of viral mutation(s) within the areas targeted by this assay, and inadequate number of viral copies (<131 copies/mL). A negative result must be combined with clinical observations, patient history, and epidemiological information. The expected result is Negative. Fact Sheet for Patients:  PinkCheek.be Fact Sheet for Healthcare Providers:  GravelBags.it This test is not yet ap proved or cleared by the [place] FDA and  has been authorized for detection and/or diagnosis of SARS-CoV-2 by FDA under an Emergency Use Authorization (EUA). This EUA will remain  in effect (meaning this test can be used) for the duration of the COVID-19 declaration under Section 564(b)(1) of the Act, 21 U.S.C. section 360bbb-3(b)(1), unless the authorization is terminated or revoked sooner.    Influenza A by PCR NEGATIVE NEGATIVE Final   Influenza B by PCR NEGATIVE NEGATIVE Final    Comment: (NOTE) The Xpert  Xpress SARS-CoV-2/FLU/RSV assay is intended as an aid in  the diagnosis of influenza from Nasopharyngeal swab specimens and  should not be used as a sole basis for treatment. Nasal washings and  aspirates are unacceptable for Xpert Xpress SARS-CoV-2/FLU/RSV  testing. Fact Sheet for Patients:  PinkCheek.be Fact Sheet for Healthcare Providers: GravelBags.it This test is not yet approved or cleared by the [place] FDA and  has been authorized for detection and/or diagnosis of SARS-CoV-2 by  FDA under an Emergency Use Authorization (EUA). This EUA will remain  in effect (meaning this test can be used) for the duration of the  Covid-19 declaration under Section 564(b)(1) of the Act, 21  U.S.C. section 360bbb-3(b)(1), unless the authorization is  terminated or revoked. Performed at [facility], [address] [place], [place] [address]      Discharge Instructions:   Discharge Instructions    Diet - low sodium heart healthy   Complete by: As directed    Increase activity slowly   Complete by: As directed      Allergies as of 05/24/2019      Reactions   Iodine    Seizure Betadine ok Other reaction(s): Other (See Comments) Sneezing Seizure Betadine ok   Penicillins Other (See Comments), Rash   Has patient had a PCN reaction causing immediate rash, facial/tongue/throat swelling, SOB or lightheadedness with hypotension: Unknown Has patient had a PCN reaction causing severe rash involving mucus membranes or skin necrosis: Unknown Has patient had a PCN reaction that required hospitalization: Unknown Has patient had a PCN reaction occurring within the last 10 years: Unknown If all of the above answers are "NO", then may proceed with Cephalosporin use. Other reaction(s): Other (See Comments) Has patient had a PCN reaction causing immediate rash, facial/tongue/throat swelling, SOB or lightheadedness with  hypotension: Unknown Has patient had a PCN reaction causing severe rash involving mucus membranes or skin necrosis: Unknown Has patient had a PCN reaction that required hospitalization: Unknown Has patient had a PCN reaction occurring within the last 10 years: Unknown If all of the above answers are "NO", then may proceed with Cephalosporin use. Has patient had a PCN reaction causing immediate rash, facial/tongue/throat swelling, SOB or lightheadedness with hypotension: Unknown Has patient had a PCN reaction causing severe rash involving mucus membranes or skin necrosis: Unknown Has patient had a PCN reaction that required hospitalization: Unknown Has patient had a PCN reaction occurring within the last 10 years: Unknown If all of the above answers are "NO", then may proceed with Cephalosporin use.      Medication List    TAKE these medications   aspirin EC 81 MG tablet Take 1 tablet (81 mg total) by mouth daily.   clopidogrel 75 MG tablet Commonly known as: Plavix Take 1 tablet (75 mg total) by mouth daily.   isosorbide dinitrate 30 MG tablet Commonly known as: ISORDIL Take 30 mg by mouth daily.   losartan 25 MG tablet Commonly known as: COZAAR Take 25 mg by mouth daily.   meclizine 25 MG tablet Commonly known as: ANTIVERT Take 25 mg by mouth 3 (three) times daily as needed.   metoprolol succinate 25 MG 24 hr tablet Commonly known as: TOPROL-XL Take 50 mg by mouth daily.   nitroGLYCERIN 0.3 MG SL tablet Commonly known as: NITROSTAT Place 1 tablet under the tongue as needed.   pantoprazole 40 MG tablet Commonly known as: PROTONIX Take 40 mg by mouth daily.   ranolazine 500 MG 12 hr tablet Commonly known as: RANEXA Take 1 tablet (500 mg total) by mouth 2 (two) times daily.   rosuvastatin 10 MG tablet Commonly known as: CRESTOR Take 10 mg by mouth daily.   sucralfate 1 g tablet Commonly known as: CARAFATE Take 1 g by mouth 4 (  four) times daily.   traZODone 50  MG tablet Commonly known as: DESYREL Take 1 tablet by mouth at bedtime as needed.         Time coordinating discharge: 32 minutes  Signed:  Montae Stager  [facility] 05/24/2019, 4:11 PM

## 2019-05-24 NOTE — Progress Notes (Signed)
CRITICAL VALUE ALERT  Critical Value:  APTT >160  Date & Time Notied:  05/23/19 at 2130  Provider Notified: Dr. Stark Klein  Orders Received/Actions taken: Pharmacy notified and instructed RN to hold heparin gtt until 0100 and then restart at 41mL/hr.

## 2019-05-24 NOTE — Progress Notes (Signed)
Pt received from Cath lab. VSS, right groin CDI.  Verbal order per Dr. Humphrey Rolls to d/c heparin gtt.

## 2019-05-24 NOTE — Progress Notes (Signed)
SUBJECTIVE: Patient denies any chest pain or shortness of breath   Vitals:   05/24/19 0824 05/24/19 0903 05/24/19 0950 05/24/19 1000  BP: (!) 144/78  133/67 127/72  Pulse: 78  72 68  Resp: 14  16 17   Temp: 98.2 F (36.8 C)     TempSrc: Oral     SpO2: 94% 97% 96% 93%  Weight: 71.3 kg     Height: 5' (1.524 m)       Intake/Output Summary (Last 24 hours) at 05/24/2019 1014 Last data filed at 05/24/2019 0550 Gross per 24 hour  Intake 973.06 ml  Output 1200 ml  Net -226.94 ml    LABS: Basic Metabolic Panel: Recent Labs    05/23/19 0455 05/23/19 1947  NA 134* 135  K 4.5 4.2  CL 104 106  CO2 26 25  GLUCOSE 188* 114*  BUN 36* 25*  CREATININE 1.75* 1.66*  CALCIUM 8.6* 8.5*   Liver Function Tests: Recent Labs    05/22/19 2351  AST 43*  ALT 21  ALKPHOS 102  BILITOT 0.6  PROT 9.1*  ALBUMIN 3.7   Recent Labs    05/23/19 0455 05/24/19 0356  LIPASE 62* 48   CBC: Recent Labs    05/22/19 2351 05/23/19 0455 05/23/19 1947 05/24/19 0356  WBC 6.8   < > 5.6 5.5  NEUTROABS 2.6  --   --   --   HGB 12.3   < > 10.1* 9.8*  HCT 36.4   < > 30.8* 29.4*  MCV 91.9   < > 93.1 93.9  PLT 170   < > 151 157   < > = values in this interval not displayed.   Cardiac Enzymes: No results for input(s): CKTOTAL, CKMB, CKMBINDEX, TROPONINI in the last 72 hours. BNP: Invalid input(s): POCBNP D-Dimer: No results for input(s): DDIMER in the last 72 hours. Hemoglobin A1C: Recent Labs    05/23/19 0455  HGBA1C 5.9*   Fasting Lipid Panel: Recent Labs    05/23/19 0455  CHOL 120  HDL 41  LDLCALC 71  TRIG 41  CHOLHDL 2.9   Thyroid Function Tests: No results for input(s): TSH, T4TOTAL, T3FREE, THYROIDAB in the last 72 hours.  Invalid input(s): FREET3 Anemia Panel: No results for input(s): VITAMINB12, FOLATE, FERRITIN, TIBC, IRON, RETICCTPCT in the last 72 hours.   PHYSICAL EXAM General: Well developed, well nourished, in no acute distress HEENT:  Normocephalic and  atramatic Neck:  No JVD.  Lungs: Clear bilaterally to auscultation and percussion. Heart: HRRR . Normal S1 and S2 without gallops or murmurs.  Abdomen: Bowel sounds are positive, abdomen soft and non-tender  Msk:  Back normal, normal gait. Normal strength and tone for age. Extremities: No clubbing, cyanosis or edema.   Neuro: Alert and oriented X 3. Psych:  Good affect, responds appropriately  TELEMETRY: Sinus rhythm  ASSESSMENT AND PLAN: Moderate disease in the mid LAD which is severely calcified and will treat the patient medically patient had mild renal insufficiency thus LV gram was deferred.  We will start the patient on aspirin Plavix and Protonix because has acid reflux and add Ranexa 500 mg p.o. twice daily beside high-dose statin therapy.  Also add metoprolol as antianginal agent.  Active Problems:   NSTEMI (non-ST elevated myocardial infarction) ([place])    Julie Kavanagh A, MD, [facility] 05/24/2019 10:14 AM

## 2019-05-24 NOTE — Progress Notes (Signed)
*  PRELIMINARY RESULTS* Echocardiogram 2D Echocardiogram has been performed.  Sherrie Sport 05/24/2019, 10:24 AM

## 2019-05-24 NOTE — Progress Notes (Signed)
Educated patient on discharge instructions and patient verbalized understanding. Patient driven home by son.

## 2019-06-05 ENCOUNTER — Other Ambulatory Visit: Payer: Self-pay | Admitting: Gastroenterology

## 2019-06-05 DIAGNOSIS — R1013 Epigastric pain: Secondary | ICD-10-CM

## 2019-06-11 ENCOUNTER — Ambulatory Visit
Admission: RE | Admit: 2019-06-11 | Discharge: 2019-06-11 | Disposition: A | Discharge status: Home / Self Care | Payer: Medicare Other | Source: Ambulatory Visit | Attending: Gastroenterology | Admitting: Gastroenterology

## 2019-06-11 ENCOUNTER — Other Ambulatory Visit: Payer: Self-pay

## 2019-06-11 DIAGNOSIS — R1013 Epigastric pain: Secondary | ICD-10-CM | POA: Diagnosis present

## 2019-07-26 ENCOUNTER — Other Ambulatory Visit: Payer: Self-pay | Admitting: Student

## 2019-07-26 DIAGNOSIS — R1013 Epigastric pain: Secondary | ICD-10-CM

## 2019-07-26 DIAGNOSIS — R630 Anorexia: Secondary | ICD-10-CM

## 2019-07-26 DIAGNOSIS — Z8719 Personal history of other diseases of the digestive system: Secondary | ICD-10-CM

## 2019-08-07 ENCOUNTER — Other Ambulatory Visit: Payer: Self-pay | Admitting: Student

## 2019-08-07 ENCOUNTER — Ambulatory Visit
Admission: RE | Admit: 2019-08-07 | Discharge: 2019-08-07 | Disposition: A | Discharge status: Home / Self Care | Payer: Medicare Other | Source: Ambulatory Visit | Attending: Student | Admitting: Student

## 2019-08-07 ENCOUNTER — Other Ambulatory Visit: Payer: Self-pay

## 2019-08-07 DIAGNOSIS — R630 Anorexia: Secondary | ICD-10-CM | POA: Diagnosis present

## 2019-08-07 DIAGNOSIS — R1013 Epigastric pain: Secondary | ICD-10-CM | POA: Insufficient documentation

## 2019-08-07 DIAGNOSIS — Z8719 Personal history of other diseases of the digestive system: Secondary | ICD-10-CM | POA: Insufficient documentation

## 2019-11-19 ENCOUNTER — Other Ambulatory Visit: Payer: Self-pay

## 2019-11-19 ENCOUNTER — Encounter (INDEPENDENT_AMBULATORY_CARE_PROVIDER_SITE_OTHER): Payer: Self-pay

## 2019-11-19 ENCOUNTER — Inpatient Hospital Stay: Payer: Medicare Other | Attending: Oncology

## 2019-11-19 DIAGNOSIS — C9000 Multiple myeloma not having achieved remission: Secondary | ICD-10-CM | POA: Diagnosis not present

## 2019-11-19 LAB — CBC WITH DIFFERENTIAL/PLATELET
Abs Immature Granulocytes: 0.01 10*3/uL (ref 0.00–0.07)
Basophils Absolute: 0 10*3/uL (ref 0.0–0.1)
Basophils Relative: 0 %
Eosinophils Absolute: 0 10*3/uL (ref 0.0–0.5)
Eosinophils Relative: 1 %
HCT: 34 % — ABNORMAL LOW (ref 36.0–46.0)
Hemoglobin: 11.5 g/dL — ABNORMAL LOW (ref 12.0–15.0)
Immature Granulocytes: 0 %
Lymphocytes Relative: 28 %
Lymphs Abs: 1.4 10*3/uL (ref 0.7–4.0)
MCH: 31.8 pg (ref 26.0–34.0)
MCHC: 33.8 g/dL (ref 30.0–36.0)
MCV: 93.9 fL (ref 80.0–100.0)
Monocytes Absolute: 0.5 10*3/uL (ref 0.1–1.0)
Monocytes Relative: 9 %
Neutro Abs: 3.1 10*3/uL (ref 1.7–7.7)
Neutrophils Relative %: 62 %
Platelets: 204 10*3/uL (ref 150–400)
RBC: 3.62 MIL/uL — ABNORMAL LOW (ref 3.87–5.11)
RDW: 12.7 % (ref 11.5–15.5)
WBC: 5 10*3/uL (ref 4.0–10.5)
nRBC: 0 % (ref 0.0–0.2)

## 2019-11-19 LAB — COMPREHENSIVE METABOLIC PANEL
ALT: 25 U/L (ref 0–44)
AST: 44 U/L — ABNORMAL HIGH (ref 15–41)
Albumin: 3.6 g/dL (ref 3.5–5.0)
Alkaline Phosphatase: 105 U/L (ref 38–126)
Anion gap: 6 (ref 5–15)
BUN: 33 mg/dL — ABNORMAL HIGH (ref 8–23)
CO2: 24 mmol/L (ref 22–32)
Calcium: 8.7 mg/dL — ABNORMAL LOW (ref 8.9–10.3)
Chloride: 101 mmol/L (ref 98–111)
Creatinine, Ser: 1.66 mg/dL — ABNORMAL HIGH (ref 0.44–1.00)
GFR calc Af Amer: 33 mL/min — ABNORMAL LOW (ref >60)
GFR calc non Af Amer: 28 mL/min — ABNORMAL LOW (ref >60)
Glucose, Bld: 93 mg/dL (ref 70–99)
Potassium: 4.4 mmol/L (ref 3.5–5.1)
Sodium: 131 mmol/L — ABNORMAL LOW (ref 135–145)
Total Bilirubin: 0.9 mg/dL (ref 0.3–1.2)
Total Protein: 9.3 g/dL — ABNORMAL HIGH (ref 6.5–8.1)

## 2019-11-20 LAB — IGG, IGA, IGM
IgA: 70 mg/dL (ref 64–422)
IgG (Immunoglobin G), Serum: 3853 mg/dL — ABNORMAL HIGH (ref 586–1602)
IgM (Immunoglobulin M), Srm: 99 mg/dL (ref 26–217)

## 2019-11-20 LAB — KAPPA/LAMBDA LIGHT CHAINS
Kappa free light chain: 181.6 mg/L — ABNORMAL HIGH (ref 3.3–19.4)
Kappa, lambda light chain ratio: 12.27 — ABNORMAL HIGH (ref 0.26–1.65)
Lambda free light chains: 14.8 mg/L (ref 5.7–26.3)

## 2019-11-22 ENCOUNTER — Other Ambulatory Visit: Payer: Self-pay | Admitting: Internal Medicine

## 2019-11-22 DIAGNOSIS — Z1231 Encounter for screening mammogram for malignant neoplasm of breast: Secondary | ICD-10-CM

## 2019-11-22 LAB — MULTIPLE MYELOMA PANEL, SERUM
Albumin SerPl Elph-Mcnc: 3.7 g/dL (ref 2.9–4.4)
Albumin/Glob SerPl: 0.8 (ref 0.7–1.7)
Alpha 1: 0.2 g/dL (ref 0.0–0.4)
Alpha2 Glob SerPl Elph-Mcnc: 0.7 g/dL (ref 0.4–1.0)
B-Globulin SerPl Elph-Mcnc: 1 g/dL (ref 0.7–1.3)
Gamma Glob SerPl Elph-Mcnc: 3.2 g/dL — ABNORMAL HIGH (ref 0.4–1.8)
Globulin, Total: 5.1 g/dL — ABNORMAL HIGH (ref 2.2–3.9)
IgA: 69 mg/dL (ref 64–422)
IgG (Immunoglobin G), Serum: 3873 mg/dL — ABNORMAL HIGH (ref 586–1602)
IgM (Immunoglobulin M), Srm: 96 mg/dL (ref 26–217)
M Protein SerPl Elph-Mcnc: 2.9 g/dL — ABNORMAL HIGH
Total Protein ELP: 8.8 g/dL — ABNORMAL HIGH (ref 6.0–8.5)

## 2019-11-22 NOTE — Progress Notes (Signed)
[place]  [phone] [phone]  ID: Julie Khan OB: 1935/03/10  MR#: [id number]  GYF#:[id number]  Patient Care Team: Jodi Marble, MD as PCP - General (Internal Medicine)  I connected with Julie Khan on 11/27/19 at  2:00 PM EDT by video enabled telemedicine visit and verified that I am speaking with the correct person using two identifiers.   I discussed the limitations, risks, security and privacy concerns of performing an evaluation and management service by telemedicine and the availability of in-person appointments. I also discussed with the patient that there may be a patient responsible charge related to this service. The patient expressed understanding and agreed to proceed.   Other persons participating in the visit and their role in the encounter: Patient, MD.  Patient's location: Clinic. Provider's location: Home.  CHIEF COMPLAINT: Smoldering myeloma  INTERVAL HISTORY: Patient agreed to video assisted telemedicine visit for further evaluation and discussion of her laboratory results.  She continues to feel well and remains asymptomatic.  She does not complain of weakness or fatigue. She has no neurologic complaints.  She denies any bony pain.  She denies any recent fevers or illnesses.  She has a good appetite and denies weight loss.  She denies any chest pain, shortness of breath, cough, or hemoptysis.  She denies any nausea, vomiting, constipation, or diarrhea.  She has no urinary complaints.  Patient offers no specific complaints today.  REVIEW OF SYSTEMS:   Review of Systems  Constitutional: Negative.  Negative for fever, malaise/fatigue and weight loss.  Respiratory: Negative.  Negative for cough and shortness of breath.   Cardiovascular: Negative.  Negative for chest pain and leg swelling.  Gastrointestinal: Negative.  Negative for abdominal pain, blood in stool and melena.  Genitourinary: Negative.  Negative for  dysuria.  Musculoskeletal: Negative.  Negative for back pain.  Skin: Negative.  Negative for rash.  Neurological: Negative.  Negative for sensory change, focal weakness and weakness.  Psychiatric/Behavioral: Negative.  The patient is not nervous/anxious.     As per HPI. Otherwise, a complete review of systems is negative.  PAST MEDICAL HISTORY: Past Medical History:  Diagnosis Date  . Arthritis   . Bladder cancer ([place])   . Bladder cancer ([place])   . Cancer ([place])    multiple myeloma  . Chronic kidney disease   . Chronic kidney disease   . Dizziness   . GERD (gastroesophageal reflux disease)   . Hypertension   . Multiple myeloma ([place])   . Stroke ([place])    tia x 2  . Tubular adenoma of colon     PAST SURGICAL HISTORY: Past Surgical History:  Procedure Laterality Date  . bladder tumor removed    . BREAST EXCISIONAL BIOPSY Bilateral 1970   Benign  . BREAST SURGERY     bx  . CATARACT EXTRACTION W/PHACO Left 12/16/2016   Procedure: CATARACT EXTRACTION PHACO AND INTRAOCULAR LENS PLACEMENT (IOC);  Surgeon: Eulogio Bear, MD;  Location: [facility] ORS;  Service: Ophthalmology;  Laterality: Left;  [place] 00:38.0 AP% 14.6 CDE 5.54 Fluid pack lot # [id number]  . CATARACT EXTRACTION W/PHACO Right 03/03/2017   Procedure: CATARACT EXTRACTION PHACO AND INTRAOCULAR LENS PLACEMENT ([place]);  Surgeon: Eulogio Bear, MD;  Location: [facility] ORS;  Service: Ophthalmology;  Laterality: Right;  Lot # [id number] H [place]: 00:29.8 AP%: 10.1 CDE: 3.01  . COLONOSCOPY WITH PROPOFOL N/A 07/25/2015   Procedure: COLONOSCOPY WITH PROPOFOL;  Surgeon: Josefine Class, MD;  Location: [facility] ENDOSCOPY;  Service: Endoscopy;  Laterality: N/A;  . COLONOSCOPY WITH PROPOFOL N/A 06/29/2017   Procedure: COLONOSCOPY WITH PROPOFOL;  Surgeon: Toledo, Benay Pike, MD;  Location: [facility] ENDOSCOPY;  Service: Gastroenterology;  Laterality: N/A;  . CORONARY ANGIOGRAPHY N/A 05/24/2019   Procedure: CORONARY ANGIOGRAPHY;  Surgeon: Dionisio David, MD;  Location: [place] [facility];  Service: Cardiovascular;  Laterality: N/A;  . ESOPHAGOGASTRODUODENOSCOPY (EGD) WITH PROPOFOL N/A 07/25/2015   Procedure: ESOPHAGOGASTRODUODENOSCOPY (EGD) WITH PROPOFOL;  Surgeon: Josefine Class, MD;  Location: [facility] ENDOSCOPY;  Service: Endoscopy;  Laterality: N/A;  . ESOPHAGOGASTRODUODENOSCOPY (EGD) WITH PROPOFOL N/A 06/29/2017   Procedure: ESOPHAGOGASTRODUODENOSCOPY (EGD) WITH PROPOFOL;  Surgeon: Toledo, Benay Pike, MD;  Location: [facility] ENDOSCOPY;  Service: Gastroenterology;  Laterality: N/A;  . HERNIA REPAIR    . LEFT HEART CATH N/A 05/24/2019   Procedure: Left Heart Cath;  Surgeon: Dionisio David, MD;  Location: [place] [facility];  Service: Cardiovascular;  Laterality: N/A;    FAMILY HISTORY: Family History  Problem Relation Age of Onset  . Cancer Mother   . Colon cancer Mother   . Colon polyps Mother   . Cancer Sister   . Breast cancer Sister 67  . Cancer Brother   . Bladder Cancer Neg Hx   . Kidney cancer Neg Hx     ADVANCED DIRECTIVES (Y/N):  N  HEALTH MAINTENANCE: Social History   Tobacco Use  . Smoking status: Former Research scientist (life sciences)  . Smokeless tobacco: Never Used  Vaping Use  . Vaping Use: Never used  Substance Use Topics  . Alcohol use: No  . Drug use: No     Colonoscopy:  PAP:  Bone density:  Lipid panel:  Allergies  Allergen Reactions  . Iodine     Seizure  Betadine ok Other reaction(s): Other (See Comments) Sneezing Seizure  Betadine ok  . Penicillins Other (See Comments) and Rash    Has patient had a PCN reaction causing immediate rash, facial/tongue/throat swelling, SOB or lightheadedness with hypotension: Unknown Has patient had a PCN reaction causing severe rash involving mucus membranes or skin necrosis: Unknown Has patient had a PCN reaction that required hospitalization: Unknown Has patient had a PCN reaction occurring within the last 10 years: Unknown If all of the above answers are "NO", then may  proceed with Cephalosporin use.  Other reaction(s): Other (See Comments) Has patient had a PCN reaction causing immediate rash, facial/tongue/throat swelling, SOB or lightheadedness with hypotension: Unknown Has patient had a PCN reaction causing severe rash involving mucus membranes or skin necrosis: Unknown Has patient had a PCN reaction that required hospitalization: Unknown Has patient had a PCN reaction occurring within the last 10 years: Unknown If all of the above answers are "NO", then may proceed with Cephalosporin use. Has patient had a PCN reaction causing immediate rash, facial/tongue/throat swelling, SOB or lightheadedness with hypotension: Unknown Has patient had a PCN reaction causing severe rash involving mucus membranes or skin necrosis: Unknown Has patient had a PCN reaction that required hospitalization: Unknown Has patient had a PCN reaction occurring within the last 10 years: Unknown If all of the above answers are "NO", then may proceed with Cephalosporin use.    Current Outpatient Medications  Medication Sig Dispense Refill  . losartan (COZAAR) 25 MG tablet Take 25 mg by mouth daily.    . meclizine (ANTIVERT) 25 MG tablet Take 25 mg by mouth 3 (three) times daily as needed.   1  . metoprolol succinate (TOPROL-XL) 25 MG 24 hr tablet Take 50 mg by  mouth daily.     . nitroGLYCERIN (NITROSTAT) 0.3 MG SL tablet Place 1 tablet under the tongue as needed.    . pantoprazole (PROTONIX) 40 MG tablet Take 40 mg by mouth daily.    . ranolazine (RANEXA) 500 MG 12 hr tablet Take 1 tablet (500 mg total) by mouth 2 (two) times daily. 60 tablet 0  . rosuvastatin (CRESTOR) 10 MG tablet Take 10 mg by mouth daily.     No current facility-administered medications for this visit.   Facility-Administered Medications Ordered in Other Visits  Medication Dose Route Frequency Provider Last Rate Last Admin  . sodium chloride flush (NS) 0.9 % injection 3 mL  3 mL Intravenous Q12H Neoma Laming  A, MD        OBJECTIVE: Vitals:   11/26/19 1353  BP: 118/63  Pulse: 70  Temp: (!) 97.3 F (36.3 C)  SpO2: 99%     Body mass index is 30.12 kg/m.    ECOG FS:0 - Asymptomatic  General: Well-developed, well-nourished, no acute distress. HEENT: Normocephalic. Neuro: Alert, answering all questions appropriately. Cranial nerves grossly intact. Psych: Normal affect.  LAB RESULTS:  Lab Results  Component Value Date   NA 131 (L) 11/19/2019   K 4.4 11/19/2019   CL 101 11/19/2019   CO2 24 11/19/2019   GLUCOSE 93 11/19/2019   BUN 33 (H) 11/19/2019   CREATININE 1.66 (H) 11/19/2019   CALCIUM 8.7 (L) 11/19/2019   PROT 9.3 (H) 11/19/2019   ALBUMIN 3.6 11/19/2019   AST 44 (H) 11/19/2019   ALT 25 11/19/2019   ALKPHOS 105 11/19/2019   BILITOT 0.9 11/19/2019   GFRNONAA 28 (L) 11/19/2019   GFRAA 33 (L) 11/19/2019    Lab Results  Component Value Date   WBC 5.0 11/19/2019   NEUTROABS 3.1 11/19/2019   HGB 11.5 (L) 11/19/2019   HCT 34.0 (L) 11/19/2019   MCV 93.9 11/19/2019   PLT 204 11/19/2019   Lab Results  Component Value Date   TOTALPROTELP 8.8 (H) 11/19/2019   ALBUMINELP 3.7 04/14/2017   A1GS 0.2 04/14/2017   A2GS 0.8 04/14/2017   BETS 1.1 04/14/2017   GAMS 2.8 (H) 04/14/2017   MSPIKE 2.5 (H) 04/14/2017   SPEI Comment 04/14/2017     STUDIES: No results found.  ASSESSMENT: Smoldering myeloma  PLAN:   1. Smoldering myeloma: Bone marrow biopsy on December 23, 2011 revealed 15% plasma cells with cytogenetics having monosomy 13 in approximately 7%. Skeletal survey on February 20, 2016 was negative for any lesions. Patient's M spike continues to be stable ranging from approximately 2.0-2.5, although today's result has trended up and is now 2.9.  Despite this, her IgG levels remain elevated, but stable at 3873.  Kappa free light chains also remain elevated, but stable at 181.6.  Although she continues to have renal insufficiency, but her creatinine has improved over the  past 4 months.  She has a mild anemia of 11.5 as well.  No intervention is needed at this time.  Patient acknowledges that she likely will require treatment in the future, but this is not necessary at this point.  She would require repeat bone marrow biopsy prior to initiating any treatments.  Return to clinic in 4 months with repeat laboratory work and further evaluation.   2. Renal insufficiency: Creatinine is slightly improved.  Continue follow-up with nephrology as indicated. 3.  Anemia: Mild, monitor.     I provided 20 minutes of face-to-face video visit time during this encounter which included  chart review, counseling, and coordination of care as documented above.    Lloyd Huger, MD   11/27/2019 9:06 AM

## 2019-11-26 ENCOUNTER — Inpatient Hospital Stay (HOSPITAL_BASED_OUTPATIENT_CLINIC_OR_DEPARTMENT_OTHER): Payer: Medicare Other | Admitting: Oncology

## 2019-11-26 ENCOUNTER — Other Ambulatory Visit: Payer: Self-pay

## 2019-11-26 VITALS — BP 118/63 | HR 70 | Temp 97.3°F | Wt 154.2 lb

## 2019-11-26 DIAGNOSIS — C9000 Multiple myeloma not having achieved remission: Secondary | ICD-10-CM | POA: Diagnosis not present

## 2019-11-26 DIAGNOSIS — D472 Monoclonal gammopathy: Secondary | ICD-10-CM

## 2019-11-26 NOTE — Progress Notes (Signed)
Reports generalized mild arthritis pains. Different places/ joints every day, "not bad". Appetite is 50% this is the case for last 6 years. Bowels are normal.

## 2019-12-31 ENCOUNTER — Other Ambulatory Visit: Payer: Self-pay

## 2019-12-31 ENCOUNTER — Ambulatory Visit
Admission: RE | Admit: 2019-12-31 | Discharge: 2019-12-31 | Disposition: A | Discharge status: Home / Self Care | Payer: Medicare Other | Source: Ambulatory Visit | Attending: Internal Medicine | Admitting: Internal Medicine

## 2019-12-31 DIAGNOSIS — Z1231 Encounter for screening mammogram for malignant neoplasm of breast: Secondary | ICD-10-CM | POA: Diagnosis not present

## 2020-03-14 DIAGNOSIS — K2970 Gastritis, unspecified, without bleeding: Secondary | ICD-10-CM | POA: Insufficient documentation

## 2020-03-30 NOTE — Progress Notes (Deleted)
[place]  [phone] Fax:([phone]  ID: Eustace Pen OB: [birth year]  MR#: [id number]  OZD#:[id number]  Patient Care Team: Jodi Marble, MD as PCP - General (Internal Medicine)   CHIEF COMPLAINT: Smoldering myeloma  INTERVAL HISTORY: Patient agreed to video assisted telemedicine visit for further evaluation and discussion of her laboratory results.  She continues to feel well and remains asymptomatic.  She does not complain of weakness or fatigue. She has no neurologic complaints.  She denies any bony pain.  She denies any recent fevers or illnesses.  She has a good appetite and denies weight loss.  She denies any chest pain, shortness of breath, cough, or hemoptysis.  She denies any nausea, vomiting, constipation, or diarrhea.  She has no urinary complaints.  Patient offers no specific complaints today.  REVIEW OF SYSTEMS:   Review of Systems  Constitutional: Negative.  Negative for fever, malaise/fatigue and weight loss.  Respiratory: Negative.  Negative for cough and shortness of breath.   Cardiovascular: Negative.  Negative for chest pain and leg swelling.  Gastrointestinal: Negative.  Negative for abdominal pain, blood in stool and melena.  Genitourinary: Negative.  Negative for dysuria.  Musculoskeletal: Negative.  Negative for back pain.  Skin: Negative.  Negative for rash.  Neurological: Negative.  Negative for sensory change, focal weakness and weakness.  Psychiatric/Behavioral: Negative.  The patient is not nervous/anxious.     As per HPI. Otherwise, a complete review of systems is negative.  PAST MEDICAL HISTORY: Past Medical History:  Diagnosis Date  . Arthritis   . Bladder cancer ([place])   . Bladder cancer ([place])   . Cancer ([place])    multiple myeloma  . Chronic kidney disease   . Chronic kidney disease   . Dizziness   . GERD (gastroesophageal reflux disease)   . Hypertension   . Multiple myeloma ([place])   . Stroke ([place])     tia x 2  . Tubular adenoma of colon     PAST SURGICAL HISTORY: Past Surgical History:  Procedure Laterality Date  . bladder tumor removed    . BREAST EXCISIONAL BIOPSY Bilateral 1970   Benign  . BREAST SURGERY     bx  . CATARACT EXTRACTION W/PHACO Left 12/16/2016   Procedure: CATARACT EXTRACTION PHACO AND INTRAOCULAR LENS PLACEMENT (IOC);  Surgeon: Eulogio Bear, MD;  Location: [facility] ORS;  Service: Ophthalmology;  Laterality: Left;  [place] 00:38.0 AP% 14.6 CDE 5.54 Fluid pack lot # [id number]  . CATARACT EXTRACTION W/PHACO Right 03/03/2017   Procedure: CATARACT EXTRACTION PHACO AND INTRAOCULAR LENS PLACEMENT ([place]);  Surgeon: Eulogio Bear, MD;  Location: [facility] ORS;  Service: Ophthalmology;  Laterality: Right;  Lot # [id number] H [place]: 00:29.8 AP%: 10.1 CDE: 3.01  . COLONOSCOPY WITH PROPOFOL N/A 07/25/2015   Procedure: COLONOSCOPY WITH PROPOFOL;  Surgeon: Josefine Class, MD;  Location: [facility] ENDOSCOPY;  Service: Endoscopy;  Laterality: N/A;  . COLONOSCOPY WITH PROPOFOL N/A 06/29/2017   Procedure: COLONOSCOPY WITH PROPOFOL;  Surgeon: Toledo, Benay Pike, MD;  Location: [facility] ENDOSCOPY;  Service: Gastroenterology;  Laterality: N/A;  . CORONARY ANGIOGRAPHY N/A 05/24/2019   Procedure: CORONARY ANGIOGRAPHY;  Surgeon: Dionisio David, MD;  Location: [place] [facility];  Service: Cardiovascular;  Laterality: N/A;  . ESOPHAGOGASTRODUODENOSCOPY (EGD) WITH PROPOFOL N/A 07/25/2015   Procedure: ESOPHAGOGASTRODUODENOSCOPY (EGD) WITH PROPOFOL;  Surgeon: Josefine Class, MD;  Location: [facility];  Service: Endoscopy;  Laterality: N/A;  . ESOPHAGOGASTRODUODENOSCOPY (EGD) WITH PROPOFOL N/A 06/29/2017   Procedure: ESOPHAGOGASTRODUODENOSCOPY (EGD)  WITH PROPOFOL;  Surgeon: Toledo, Benay Pike, MD;  Location: [facility] ENDOSCOPY;  Service: Gastroenterology;  Laterality: N/A;  . HERNIA REPAIR    . LEFT HEART CATH N/A 05/24/2019   Procedure: Left Heart Cath;  Surgeon: Dionisio David, MD;  Location: [place] [facility];  Service: Cardiovascular;  Laterality: N/A;    FAMILY HISTORY: Family History  Problem Relation Age of Onset  . Cancer Mother   . Colon cancer Mother   . Colon polyps Mother   . Cancer Sister   . Breast cancer Sister 26  . Cancer Brother   . Bladder Cancer Neg Hx   . Kidney cancer Neg Hx     ADVANCED DIRECTIVES (Y/N):  N  HEALTH MAINTENANCE: Social History   Tobacco Use  . Smoking status: Former Research scientist (life sciences)  . Smokeless tobacco: Never Used  Vaping Use  . Vaping Use: Never used  Substance Use Topics  . Alcohol use: No  . Drug use: No     Colonoscopy:  PAP:  Bone density:  Lipid panel:  Allergies  Allergen Reactions  . Iodine     Seizure  Betadine ok Other reaction(s): Other (See Comments) Sneezing Seizure  Betadine ok  . Penicillins Other (See Comments) and Rash    Has patient had a PCN reaction causing immediate rash, facial/tongue/throat swelling, SOB or lightheadedness with hypotension: Unknown Has patient had a PCN reaction causing severe rash involving mucus membranes or skin necrosis: Unknown Has patient had a PCN reaction that required hospitalization: Unknown Has patient had a PCN reaction occurring within the last 10 years: Unknown If all of the above answers are "NO", then may proceed with Cephalosporin use.  Other reaction(s): Other (See Comments) Has patient had a PCN reaction causing immediate rash, facial/tongue/throat swelling, SOB or lightheadedness with hypotension: Unknown Has patient had a PCN reaction causing severe rash involving mucus membranes or skin necrosis: Unknown Has patient had a PCN reaction that required hospitalization: Unknown Has patient had a PCN reaction occurring within the last 10 years: Unknown If all of the above answers are "NO", then may proceed with Cephalosporin use. Has patient had a PCN reaction causing immediate rash, facial/tongue/throat swelling, SOB or lightheadedness with hypotension:  Unknown Has patient had a PCN reaction causing severe rash involving mucus membranes or skin necrosis: Unknown Has patient had a PCN reaction that required hospitalization: Unknown Has patient had a PCN reaction occurring within the last 10 years: Unknown If all of the above answers are "NO", then may proceed with Cephalosporin use.    Current Outpatient Medications  Medication Sig Dispense Refill  . losartan (COZAAR) 25 MG tablet Take 25 mg by mouth daily.    . meclizine (ANTIVERT) 25 MG tablet Take 25 mg by mouth 3 (three) times daily as needed.   1  . metoprolol succinate (TOPROL-XL) 25 MG 24 hr tablet Take 50 mg by mouth daily.     . nitroGLYCERIN (NITROSTAT) 0.3 MG SL tablet Place 1 tablet under the tongue as needed.    . pantoprazole (PROTONIX) 40 MG tablet Take 40 mg by mouth daily.    . ranolazine (RANEXA) 500 MG 12 hr tablet Take 1 tablet (500 mg total) by mouth 2 (two) times daily. 60 tablet 0  . rosuvastatin (CRESTOR) 10 MG tablet Take 10 mg by mouth daily.     No current facility-administered medications for this visit.   Facility-Administered Medications Ordered in Other Visits  Medication Dose Route Frequency Provider Last Rate Last Admin  .  sodium chloride flush (NS) 0.9 % injection 3 mL  3 mL Intravenous Q12H Dionisio David, MD        OBJECTIVE: There were no vitals filed for this visit.   There is no height or weight on file to calculate BMI.    ECOG FS:0 - Asymptomatic  General: Well-developed, well-nourished, no acute distress. HEENT: Normocephalic. Neuro: Alert, answering all questions appropriately. Cranial nerves grossly intact. Psych: Normal affect.  LAB RESULTS:  Lab Results  Component Value Date   NA 131 (L) 11/19/2019   K 4.4 11/19/2019   CL 101 11/19/2019   CO2 24 11/19/2019   GLUCOSE 93 11/19/2019   BUN 33 (H) 11/19/2019   CREATININE 1.66 (H) 11/19/2019   CALCIUM 8.7 (L) 11/19/2019   PROT 9.3 (H) 11/19/2019   ALBUMIN 3.6 11/19/2019   AST 44  (H) 11/19/2019   ALT 25 11/19/2019   ALKPHOS 105 11/19/2019   BILITOT 0.9 11/19/2019   GFRNONAA 28 (L) 11/19/2019   GFRAA 33 (L) 11/19/2019    Lab Results  Component Value Date   WBC 5.0 11/19/2019   NEUTROABS 3.1 11/19/2019   HGB 11.5 (L) 11/19/2019   HCT 34.0 (L) 11/19/2019   MCV 93.9 11/19/2019   PLT 204 11/19/2019   Lab Results  Component Value Date   TOTALPROTELP 8.8 (H) 11/19/2019   ALBUMINELP 3.7 04/14/2017   A1GS 0.2 04/14/2017   A2GS 0.8 04/14/2017   BETS 1.1 04/14/2017   GAMS 2.8 (H) 04/14/2017   MSPIKE 2.5 (H) 04/14/2017   SPEI Comment 04/14/2017     STUDIES: No results found.  ASSESSMENT: Smoldering myeloma  PLAN:   1. Smoldering myeloma: Bone marrow biopsy on December 23, 2011 revealed 15% plasma cells with cytogenetics having monosomy 13 in approximately 7%. Skeletal survey on February 20, 2016 was negative for any lesions. Patient's M spike continues to be stable ranging from approximately 2.0-2.5, although today's result has trended up and is now 2.9.  Despite this, her IgG levels remain elevated, but stable at 3873.  Kappa free light chains also remain elevated, but stable at 181.6.  Although she continues to have renal insufficiency, but her creatinine has improved over the past 4 months.  She has a mild anemia of 11.5 as well.  No intervention is needed at this time.  Patient acknowledges that she likely will require treatment in the future, but this is not necessary at this point.  She would require repeat bone marrow biopsy prior to initiating any treatments.  Return to clinic in 4 months with repeat laboratory work and further evaluation.   2. Renal insufficiency: Creatinine is slightly improved.  Continue follow-up with nephrology as indicated. 3.  Anemia: Mild, monitor.      Lloyd Huger, MD   03/30/2020 10:01 AM

## 2020-04-03 ENCOUNTER — Other Ambulatory Visit: Payer: Self-pay

## 2020-04-03 ENCOUNTER — Inpatient Hospital Stay: Payer: Medicare Other | Attending: Oncology

## 2020-04-03 ENCOUNTER — Inpatient Hospital Stay: Payer: Medicare Other | Admitting: Oncology

## 2020-04-03 DIAGNOSIS — C9000 Multiple myeloma not having achieved remission: Secondary | ICD-10-CM | POA: Insufficient documentation

## 2020-04-03 LAB — CBC WITH DIFFERENTIAL/PLATELET
Abs Immature Granulocytes: 0.01 10*3/uL (ref 0.00–0.07)
Basophils Absolute: 0 10*3/uL (ref 0.0–0.1)
Basophils Relative: 1 %
Eosinophils Absolute: 0.1 10*3/uL (ref 0.0–0.5)
Eosinophils Relative: 1 %
HCT: 33.5 % — ABNORMAL LOW (ref 36.0–46.0)
Hemoglobin: 11.1 g/dL — ABNORMAL LOW (ref 12.0–15.0)
Immature Granulocytes: 0 %
Lymphocytes Relative: 38 %
Lymphs Abs: 1.4 10*3/uL (ref 0.7–4.0)
MCH: 32.1 pg (ref 26.0–34.0)
MCHC: 33.1 g/dL (ref 30.0–36.0)
MCV: 96.8 fL (ref 80.0–100.0)
Monocytes Absolute: 0.4 10*3/uL (ref 0.1–1.0)
Monocytes Relative: 9 %
Neutro Abs: 1.9 10*3/uL (ref 1.7–7.7)
Neutrophils Relative %: 51 %
Platelets: 184 10*3/uL (ref 150–400)
RBC: 3.46 MIL/uL — ABNORMAL LOW (ref 3.87–5.11)
RDW: 12.7 % (ref 11.5–15.5)
WBC: 3.8 10*3/uL — ABNORMAL LOW (ref 4.0–10.5)
nRBC: 0 % (ref 0.0–0.2)

## 2020-04-03 LAB — COMPREHENSIVE METABOLIC PANEL
ALT: 19 U/L (ref 0–44)
AST: 38 U/L (ref 15–41)
Albumin: 3.4 g/dL — ABNORMAL LOW (ref 3.5–5.0)
Alkaline Phosphatase: 95 U/L (ref 38–126)
Anion gap: 5 (ref 5–15)
BUN: 37 mg/dL — ABNORMAL HIGH (ref 8–23)
CO2: 26 mmol/L (ref 22–32)
Calcium: 8.5 mg/dL — ABNORMAL LOW (ref 8.9–10.3)
Chloride: 101 mmol/L (ref 98–111)
Creatinine, Ser: 2.01 mg/dL — ABNORMAL HIGH (ref 0.44–1.00)
GFR, Estimated: 24 mL/min — ABNORMAL LOW (ref >60)
Glucose, Bld: 102 mg/dL — ABNORMAL HIGH (ref 70–99)
Potassium: 4.5 mmol/L (ref 3.5–5.1)
Sodium: 132 mmol/L — ABNORMAL LOW (ref 135–145)
Total Bilirubin: 0.7 mg/dL (ref 0.3–1.2)
Total Protein: 8.8 g/dL — ABNORMAL HIGH (ref 6.5–8.1)

## 2020-04-04 LAB — IGG, IGA, IGM
IgA: 72 mg/dL (ref 64–422)
IgG (Immunoglobin G), Serum: 3694 mg/dL — ABNORMAL HIGH (ref 586–1602)
IgM (Immunoglobulin M), Srm: 86 mg/dL (ref 26–217)

## 2020-04-06 NOTE — Progress Notes (Signed)
[place]  Telephone:([phone] [phone]  ID: Julie Khan OB: 12-Feb-1935  MR#: [id number]  CBS#:[id number]  Patient Care Team: Jodi Marble, MD as PCP - General (Internal Medicine)   CHIEF COMPLAINT: Smoldering myeloma  INTERVAL HISTORY: Patient returns to clinic today for repeat laboratory work and routine 65-monthevaluation.  She continues to feel well and remains asymptomatic.  She does not complain of any weakness or fatigue.  She has no neurologic complaints.  She denies any bony pain.  She denies any recent fevers or illnesses.  She has a good appetite and denies weight loss.  She denies any chest pain, shortness of breath, cough, or hemoptysis.  She denies any nausea, vomiting, constipation, or diarrhea.  She has no urinary complaints.  Patient feels at her baseline offers no specific complaints today.  REVIEW OF SYSTEMS:   Review of Systems  Constitutional: Negative.  Negative for fever, malaise/fatigue and weight loss.  Respiratory: Negative.  Negative for cough and shortness of breath.   Cardiovascular: Negative.  Negative for chest pain and leg swelling.  Gastrointestinal: Negative.  Negative for abdominal pain, blood in stool and melena.  Genitourinary: Negative.  Negative for dysuria.  Musculoskeletal: Negative.  Negative for back pain.  Skin: Negative.  Negative for rash.  Neurological: Negative.  Negative for sensory change, focal weakness and weakness.  Psychiatric/Behavioral: Negative.  The patient is not nervous/anxious.     As per HPI. Otherwise, a complete review of systems is negative.  PAST MEDICAL HISTORY: Past Medical History:  Diagnosis Date  . Arthritis   . Bladder cancer ([place]   . Bladder cancer ([place]   . Cancer ([place]    multiple myeloma  . Chronic kidney disease   . Chronic kidney disease   . Dizziness   . GERD (gastroesophageal reflux disease)   . Hypertension   . Multiple myeloma ([place]   . Stroke  ([place]    tia x 2  . Tubular adenoma of colon     PAST SURGICAL HISTORY: Past Surgical History:  Procedure Laterality Date  . bladder tumor removed    . BREAST EXCISIONAL BIOPSY Bilateral 1970   Benign  . BREAST SURGERY     bx  . CATARACT EXTRACTION W/PHACO Left 12/16/2016   Procedure: CATARACT EXTRACTION PHACO AND INTRAOCULAR LENS PLACEMENT (IOC);  Surgeon: KEulogio Bear MD;  Location: [facility] ORS;  Service: Ophthalmology;  Laterality: Left;  [place]:38.0 AP% 14.6 CDE 5.54 Fluid pack lot # [id number]  . CATARACT EXTRACTION W/PHACO Right 03/03/2017   Procedure: CATARACT EXTRACTION PHACO AND INTRAOCULAR LENS PLACEMENT ([place];  Surgeon: KEulogio Bear MD;  Location: [facility] ORS;  Service: Ophthalmology;  Laterality: Right;  Lot # [id number] [place] 00:29.8 AP%: 10.1 CDE: 3.01  . COLONOSCOPY WITH PROPOFOL N/A 07/25/2015   Procedure: COLONOSCOPY WITH PROPOFOL;  Surgeon: MJosefine Class MD;  [facility];  Service: Endoscopy;  Laterality: N/A;  . COLONOSCOPY WITH PROPOFOL N/A 06/29/2017   Procedure: COLONOSCOPY WITH PROPOFOL;  Surgeon: Toledo, TBenay Pike MD;  Location: [facility] ENDOSCOPY;  Service: Gastroenterology;  Laterality: N/A;  . CORONARY ANGIOGRAPHY N/A 05/24/2019   Procedure: CORONARY ANGIOGRAPHY;  Surgeon: KDionisio David MD;  [facility];  Service: Cardiovascular;  Laterality: N/A;  . ESOPHAGOGASTRODUODENOSCOPY (EGD) WITH PROPOFOL N/A 07/25/2015   Procedure: ESOPHAGOGASTRODUODENOSCOPY (EGD) WITH PROPOFOL;  Surgeon: MJosefine Class MD;  [facility];  Service: Endoscopy;  Laterality: N/A;  . ESOPHAGOGASTRODUODENOSCOPY (EGD) WITH PROPOFOL N/A 06/29/2017  Procedure: ESOPHAGOGASTRODUODENOSCOPY (EGD) WITH PROPOFOL;  Surgeon: Toledo, Benay Pike, MD;  Location: [facility] ENDOSCOPY;  Service: Gastroenterology;  Laterality: N/A;  . HERNIA REPAIR    . LEFT HEART CATH N/A 05/24/2019   Procedure: Left Heart Cath;  Surgeon: Dionisio David, MD;  Location:  [place] [facility] LAB;  Service: Cardiovascular;  Laterality: N/A;    FAMILY HISTORY: Family History  Problem Relation Age of Onset  . Cancer Mother   . Colon cancer Mother   . Colon polyps Mother   . Cancer Sister   . Breast cancer Sister 19  . Cancer Brother   . Bladder Cancer Neg Hx   . Kidney cancer Neg Hx     ADVANCED DIRECTIVES (Y/N):  N  HEALTH MAINTENANCE: Social History   Tobacco Use  . Smoking status: Former Research scientist (life sciences)  . Smokeless tobacco: Never Used  Vaping Use  . Vaping Use: Never used  Substance Use Topics  . Alcohol use: No  . Drug use: No     Colonoscopy:  PAP:  Bone density:  Lipid panel:  Allergies  Allergen Reactions  . Iodine     Seizure  Betadine ok Other reaction(s): Other (See Comments) Sneezing Seizure  Betadine ok  . Sucralfate Rash  . Penicillins Other (See Comments) and Rash    Has patient had a PCN reaction causing immediate rash, facial/tongue/throat swelling, SOB or lightheadedness with hypotension: Unknown Has patient had a PCN reaction causing severe rash involving mucus membranes or skin necrosis: Unknown Has patient had a PCN reaction that required hospitalization: Unknown Has patient had a PCN reaction occurring within the last 10 years: Unknown If all of the above answers are "NO", then may proceed with Cephalosporin use.  Other reaction(s): Other (See Comments) Has patient had a PCN reaction causing immediate rash, facial/tongue/throat swelling, SOB or lightheadedness with hypotension: Unknown Has patient had a PCN reaction causing severe rash involving mucus membranes or skin necrosis: Unknown Has patient had a PCN reaction that required hospitalization: Unknown Has patient had a PCN reaction occurring within the last 10 years: Unknown If all of the above answers are "NO", then may proceed with Cephalosporin use. Has patient had a PCN reaction causing immediate rash, facial/tongue/throat swelling, SOB or lightheadedness  with hypotension: Unknown Has patient had a PCN reaction causing severe rash involving mucus membranes or skin necrosis: Unknown Has patient had a PCN reaction that required hospitalization: Unknown Has patient had a PCN reaction occurring within the last 10 years: Unknown If all of the above answers are "NO", then may proceed with Cephalosporin use.    Current Outpatient Medications  Medication Sig Dispense Refill  . clopidogrel (PLAVIX) 75 MG tablet Take 75 mg by mouth daily.    Marland Kitchen losartan (COZAAR) 25 MG tablet Take 25 mg by mouth daily.    . meclizine (ANTIVERT) 25 MG tablet Take 25 mg by mouth 3 (three) times daily as needed.   1  . metoprolol succinate (TOPROL-XL) 25 MG 24 hr tablet Take 50 mg by mouth daily.     . nitroGLYCERIN (NITROSTAT) 0.3 MG SL tablet Place 1 tablet under the tongue as needed.    Marland Kitchen omeprazole (PRILOSEC) 40 MG capsule Take by mouth.    Marland Kitchen rOPINIRole (REQUIP) 0.25 MG tablet Take 1 tablet by mouth as needed.    . rosuvastatin (CRESTOR) 10 MG tablet Take 10 mg by mouth daily.     No current facility-administered medications for this visit.   Facility-Administered Medications Ordered in Other  Visits  Medication Dose Route Frequency Provider Last Rate Last Admin  . sodium chloride flush (NS) 0.9 % injection 3 mL  3 mL Intravenous Q12H Neoma Laming A, MD        OBJECTIVE: Vitals:   04/10/20 1315  BP: 129/80  Pulse: 73  Resp: 16  Temp: 98.2 F (36.8 C)  SpO2: 98%     Body mass index is 30.41 kg/m.    ECOG FS:0 - Asymptomatic  General: Well-developed, well-nourished, no acute distress. Eyes: Pink conjunctiva, anicteric sclera. HEENT: Normocephalic, moist mucous membranes. Lungs: No audible wheezing or coughing. Heart: Regular rate and rhythm. Abdomen: Soft, nontender, no obvious distention. Musculoskeletal: No edema, cyanosis, or clubbing. Neuro: Alert, answering all questions appropriately. Cranial nerves grossly intact. Skin: No rashes or petechiae  noted. Psych: Normal affect.  LAB RESULTS:  Lab Results  Component Value Date   NA 132 (L) 04/03/2020   K 4.5 04/03/2020   CL 101 04/03/2020   CO2 26 04/03/2020   GLUCOSE 102 (H) 04/03/2020   BUN 37 (H) 04/03/2020   CREATININE 2.01 (H) 04/03/2020   CALCIUM 8.5 (L) 04/03/2020   PROT 8.8 (H) 04/03/2020   ALBUMIN 3.4 (L) 04/03/2020   AST 38 04/03/2020   ALT 19 04/03/2020   ALKPHOS 95 04/03/2020   BILITOT 0.7 04/03/2020   GFRNONAA 24 (L) 04/03/2020   GFRAA 33 (L) 11/19/2019    Lab Results  Component Value Date   WBC 3.8 (L) 04/03/2020   NEUTROABS 1.9 04/03/2020   HGB 11.1 (L) 04/03/2020   HCT 33.5 (L) 04/03/2020   MCV 96.8 04/03/2020   PLT 184 04/03/2020      STUDIES: DG Bone Survey Met  Result Date: 04/10/2020 CLINICAL DATA:  Multiple myeloma EXAM: METASTATIC BONE SURVEY COMPARISON:  Bone survey 02/20/2016 FINDINGS: No lytic lesions of myeloma identified.  No fracture. Degenerative change in both knees. Degenerative change in the cervical spine. Mild scarring in the lung bases. Atherosclerotic calcification aortic arch. IMPRESSION: No radiographic sign of multiple myeloma. Electronically Signed   By: Franchot Gallo M.D.   On: 04/10/2020 16:17    ASSESSMENT: Smoldering myeloma  PLAN:   1. Smoldering myeloma: Bone marrow biopsy on December 23, 2011 revealed 15% plasma cells with cytogenetics having monosomy 13 in approximately 7%.  Her most recent skeletal survey on April 10, 2020 reviewed independently and reported as above with no radiographic sign of multiple myeloma or progressive disease.  Patient M spike has trended up slightly and is now 3.1.  Previously, this was relatively stable between 2.0 and 2.5.  IgG levels and kappa free light change have also trended up slightly and are now 3954 and 210.9 respectively.  Her renal function has become slightly worse and now her creatinine is 2.0.  Her hemoglobin is trending down and is now 11.1.  We discussed the possibility  of repeat bone marrow biopsy, but patient would like to wait another 4 months to reassess her laboratory work.  If her myeloma labs continue to trend up, will consider bone marrow biopsy at that time.  Return to clinic in 4 months with repeat laboratory work and further evaluation.   2. Renal insufficiency: Creatinine is slightly worse.  Consider repeat bone marrow biopsy in the near future.  Continue follow-up with nephrology. 3.  Anemia: Hemoglobin has trended down slightly, monitor.    Lloyd Huger, MD   04/12/2020 9:16 AM

## 2020-04-07 LAB — MULTIPLE MYELOMA PANEL, SERUM
Albumin SerPl Elph-Mcnc: 3.6 g/dL (ref 2.9–4.4)
Albumin/Glob SerPl: 0.7 (ref 0.7–1.7)
Alpha 1: 0.2 g/dL (ref 0.0–0.4)
Alpha2 Glob SerPl Elph-Mcnc: 0.6 g/dL (ref 0.4–1.0)
B-Globulin SerPl Elph-Mcnc: 1 g/dL (ref 0.7–1.3)
Gamma Glob SerPl Elph-Mcnc: 3.4 g/dL — ABNORMAL HIGH (ref 0.4–1.8)
Globulin, Total: 5.2 g/dL — ABNORMAL HIGH (ref 2.2–3.9)
IgA: 64 mg/dL (ref 64–422)
IgG (Immunoglobin G), Serum: 3954 mg/dL — ABNORMAL HIGH (ref 586–1602)
IgM (Immunoglobulin M), Srm: 87 mg/dL (ref 26–217)
M Protein SerPl Elph-Mcnc: 3.1 g/dL — ABNORMAL HIGH
Total Protein ELP: 8.8 g/dL — ABNORMAL HIGH (ref 6.0–8.5)

## 2020-04-07 LAB — KAPPA/LAMBDA LIGHT CHAINS
Kappa free light chain: 210.9 mg/L — ABNORMAL HIGH (ref 3.3–19.4)
Kappa, lambda light chain ratio: 19.53 — ABNORMAL HIGH (ref 0.26–1.65)
Lambda free light chains: 10.8 mg/L (ref 5.7–26.3)

## 2020-04-10 ENCOUNTER — Ambulatory Visit
Admission: RE | Admit: 2020-04-10 | Discharge: 2020-04-10 | Disposition: A | Discharge status: Home / Self Care | Payer: Medicare Other | Source: Ambulatory Visit | Attending: Oncology | Admitting: Oncology

## 2020-04-10 ENCOUNTER — Encounter: Payer: Self-pay | Admitting: Oncology

## 2020-04-10 ENCOUNTER — Inpatient Hospital Stay: Payer: Medicare Other | Attending: Oncology | Admitting: Oncology

## 2020-04-10 VITALS — BP 129/80 | HR 73 | Temp 98.2°F | Resp 16 | Ht 60.0 in | Wt 155.7 lb

## 2020-04-10 DIAGNOSIS — Z8551 Personal history of malignant neoplasm of bladder: Secondary | ICD-10-CM | POA: Insufficient documentation

## 2020-04-10 DIAGNOSIS — D649 Anemia, unspecified: Secondary | ICD-10-CM | POA: Insufficient documentation

## 2020-04-10 DIAGNOSIS — Z8673 Personal history of transient ischemic attack (TIA), and cerebral infarction without residual deficits: Secondary | ICD-10-CM | POA: Diagnosis not present

## 2020-04-10 DIAGNOSIS — Z803 Family history of malignant neoplasm of breast: Secondary | ICD-10-CM | POA: Diagnosis not present

## 2020-04-10 DIAGNOSIS — Z79899 Other long term (current) drug therapy: Secondary | ICD-10-CM | POA: Insufficient documentation

## 2020-04-10 DIAGNOSIS — D472 Monoclonal gammopathy: Secondary | ICD-10-CM

## 2020-04-10 DIAGNOSIS — C9000 Multiple myeloma not having achieved remission: Secondary | ICD-10-CM | POA: Diagnosis not present

## 2020-04-10 DIAGNOSIS — Z87891 Personal history of nicotine dependence: Secondary | ICD-10-CM | POA: Diagnosis not present

## 2020-04-10 DIAGNOSIS — K219 Gastro-esophageal reflux disease without esophagitis: Secondary | ICD-10-CM | POA: Diagnosis not present

## 2020-04-10 DIAGNOSIS — I10 Essential (primary) hypertension: Secondary | ICD-10-CM | POA: Insufficient documentation

## 2020-05-05 ENCOUNTER — Telehealth: Payer: Self-pay | Admitting: Oncology

## 2020-05-05 NOTE — Telephone Encounter (Signed)
Pt called to discuss poor kidney function (per Dr. Latanya Presser) and would like a return phone call.

## 2020-05-06 NOTE — Telephone Encounter (Signed)
Call returned to patient, she had labs and follow up with Dr. Holley Raring. She states Dr. Holley Raring recommends that she schedule follow up to discuss treatment options given her worsening kidney function. She also mentioned that a bone marrow biopsy was recommended and she is willing to to that at this time? I told her it was probably best to schedule her for MD f/u to discuss plan.

## 2020-05-12 NOTE — Progress Notes (Signed)
[place]  [phone] [phone]  ID: Julie Khan OB: 1935/03/06  MR#: [id number]  DTO#:[id number]  Patient Care Team: Jodi Marble, MD as PCP - General (Internal Medicine)   CHIEF COMPLAINT: Smoldering myeloma  INTERVAL HISTORY: Patient returns to clinic today as an add-on for further evaluation and discussion of proceeding with bone marrow biopsy. She was recently seen by nephrology and found to have worsening kidney function with concern of possible worsening of her underlying smoldering myeloma. She continues to feel well and remains asymptomatic. She denies any weakness or fatigue.  She has no neurologic complaints.  She denies any bony pain.  She denies any recent fevers or illnesses.  She has a good appetite and denies weight loss.  She denies any chest pain, shortness of breath, cough, or hemoptysis.  She denies any nausea, vomiting, constipation, or diarrhea.  She has no urinary complaints. Patient offers no specific complaints today.  REVIEW OF SYSTEMS:   Review of Systems  Constitutional: Negative.  Negative for fever, malaise/fatigue and weight loss.  Respiratory: Negative.  Negative for cough and shortness of breath.   Cardiovascular: Negative.  Negative for chest pain and leg swelling.  Gastrointestinal: Negative.  Negative for abdominal pain, blood in stool and melena.  Genitourinary: Negative.  Negative for dysuria.  Musculoskeletal: Negative.  Negative for back pain.  Skin: Negative.  Negative for rash.  Neurological: Negative.  Negative for sensory change, focal weakness and weakness.  Psychiatric/Behavioral: Negative.  The patient is not nervous/anxious.     As per HPI. Otherwise, a complete review of systems is negative.  PAST MEDICAL HISTORY: Past Medical History:  Diagnosis Date  . Arthritis   . Bladder cancer ([place])   . Bladder cancer ([place])   . Cancer ([place])    multiple myeloma  . Chronic kidney disease   .  Chronic kidney disease   . Dizziness   . GERD (gastroesophageal reflux disease)   . Hypertension   . Multiple myeloma ([place])   . Stroke ([place])    tia x 2  . Tubular adenoma of colon     PAST SURGICAL HISTORY: Past Surgical History:  Procedure Laterality Date  . bladder tumor removed    . BREAST EXCISIONAL BIOPSY Bilateral 1970   Benign  . BREAST SURGERY     bx  . CATARACT EXTRACTION W/PHACO Left 12/16/2016   Procedure: CATARACT EXTRACTION PHACO AND INTRAOCULAR LENS PLACEMENT (IOC);  Surgeon: Eulogio Bear, MD;  Location: [facility] ORS;  Service: Ophthalmology;  Laterality: Left;  [place] 00:38.0 AP% 14.6 CDE 5.54 Fluid pack lot # [id number]  . CATARACT EXTRACTION W/PHACO Right 03/03/2017   Procedure: CATARACT EXTRACTION PHACO AND INTRAOCULAR LENS PLACEMENT ([place]);  Surgeon: Eulogio Bear, MD;  Location: [facility] ORS;  Service: Ophthalmology;  Laterality: Right;  Lot # [id number] H [place]: 00:29.8 AP%: 10.1 CDE: 3.01  . COLONOSCOPY WITH PROPOFOL N/A 07/25/2015   Procedure: COLONOSCOPY WITH PROPOFOL;  Surgeon: Josefine Class, MD;  Location: [facility];  Service: Endoscopy;  Laterality: N/A;  . COLONOSCOPY WITH PROPOFOL N/A 06/29/2017   Procedure: COLONOSCOPY WITH PROPOFOL;  Surgeon: Toledo, Benay Pike, MD;  Location: [facility] ENDOSCOPY;  Service: Gastroenterology;  Laterality: N/A;  . CORONARY ANGIOGRAPHY N/A 05/24/2019   Procedure: CORONARY ANGIOGRAPHY;  Surgeon: Dionisio David, MD;  Location: [place] CV LAB;  Service: Cardiovascular;  Laterality: N/A;  . ESOPHAGOGASTRODUODENOSCOPY (EGD) WITH PROPOFOL N/A 07/25/2015   Procedure: ESOPHAGOGASTRODUODENOSCOPY (EGD) WITH PROPOFOL;  Surgeon: Josefine Class, MD;  Location: [facility] ENDOSCOPY;  Service: Endoscopy;  Laterality: N/A;  . ESOPHAGOGASTRODUODENOSCOPY (EGD) WITH PROPOFOL N/A 06/29/2017   Procedure: ESOPHAGOGASTRODUODENOSCOPY (EGD) WITH PROPOFOL;  Surgeon: Toledo, Benay Pike, MD;  Location: [facility] ENDOSCOPY;  Service: Gastroenterology;   Laterality: N/A;  . HERNIA REPAIR    . LEFT HEART CATH N/A 05/24/2019   Procedure: Left Heart Cath;  Surgeon: Dionisio David, MD;  Location: [place] CV LAB;  Service: Cardiovascular;  Laterality: N/A;    FAMILY HISTORY: Family History  Problem Relation Age of Onset  . Cancer Mother   . Colon cancer Mother   . Colon polyps Mother   . Cancer Sister   . Breast cancer Sister 66  . Cancer Brother   . Bladder Cancer Neg Hx   . Kidney cancer Neg Hx     ADVANCED DIRECTIVES (Y/N):  N  HEALTH MAINTENANCE: Social History   Tobacco Use  . Smoking status: Former Research scientist (life sciences)  . Smokeless tobacco: Never Used  Vaping Use  . Vaping Use: Never used  Substance Use Topics  . Alcohol use: No  . Drug use: No     Colonoscopy:  PAP:  Bone density:  Lipid panel:  Allergies  Allergen Reactions  . Iodine     Seizure  Betadine ok Other reaction(s): Other (See Comments) Sneezing Seizure  Betadine ok  . Sucralfate Rash  . Penicillins Other (See Comments) and Rash    Has patient had a PCN reaction causing immediate rash, facial/tongue/throat swelling, SOB or lightheadedness with hypotension: Unknown Has patient had a PCN reaction causing severe rash involving mucus membranes or skin necrosis: Unknown Has patient had a PCN reaction that required hospitalization: Unknown Has patient had a PCN reaction occurring within the last 10 years: Unknown If all of the above answers are "NO", then may proceed with Cephalosporin use.  Other reaction(s): Other (See Comments) Has patient had a PCN reaction causing immediate rash, facial/tongue/throat swelling, SOB or lightheadedness with hypotension: Unknown Has patient had a PCN reaction causing severe rash involving mucus membranes or skin necrosis: Unknown Has patient had a PCN reaction that required hospitalization: Unknown Has patient had a PCN reaction occurring within the last 10 years: Unknown If all of the above answers are "NO", then may  proceed with Cephalosporin use. Has patient had a PCN reaction causing immediate rash, facial/tongue/throat swelling, SOB or lightheadedness with hypotension: Unknown Has patient had a PCN reaction causing severe rash involving mucus membranes or skin necrosis: Unknown Has patient had a PCN reaction that required hospitalization: Unknown Has patient had a PCN reaction occurring within the last 10 years: Unknown If all of the above answers are "NO", then may proceed with Cephalosporin use.    Current Outpatient Medications  Medication Sig Dispense Refill  . clopidogrel (PLAVIX) 75 MG tablet Take 75 mg by mouth daily.    Marland Kitchen losartan (COZAAR) 25 MG tablet Take 25 mg by mouth daily.    . meclizine (ANTIVERT) 25 MG tablet Take 25 mg by mouth 3 (three) times daily as needed.   1  . metoprolol succinate (TOPROL-XL) 25 MG 24 hr tablet Take 50 mg by mouth daily.     . nitroGLYCERIN (NITROSTAT) 0.3 MG SL tablet Place 1 tablet under the tongue as needed.    Marland Kitchen omeprazole (PRILOSEC) 40 MG capsule Take by mouth.    Marland Kitchen rOPINIRole (REQUIP) 0.25 MG tablet Take 1 tablet by mouth as needed.    . rosuvastatin (CRESTOR) 10 MG tablet Take 10 mg by mouth  daily.     No current facility-administered medications for this visit.   Facility-Administered Medications Ordered in Other Visits  Medication Dose Route Frequency Provider Last Rate Last Admin  . sodium chloride flush (NS) 0.9 % injection 3 mL  3 mL Intravenous Q12H Neoma Laming A, MD        OBJECTIVE: Vitals:   05/13/20 1120  BP: 123/90  Pulse: 78  Resp: 18  Temp: (!) 96.1 F (35.6 C)     Body mass index is 30.72 kg/m.    ECOG FS:0 - Asymptomatic  General: Well-developed, well-nourished, no acute distress. Eyes: Pink conjunctiva, anicteric sclera. HEENT: Normocephalic, moist mucous membranes. Lungs: No audible wheezing or coughing. Heart: Regular rate and rhythm. Abdomen: Soft, nontender, no obvious distention. Musculoskeletal: No edema,  cyanosis, or clubbing. Neuro: Alert, answering all questions appropriately. Cranial nerves grossly intact. Skin: No rashes or petechiae noted. Psych: Normal affect.   LAB RESULTS:  Lab Results  Component Value Date   NA 132 (L) 04/03/2020   K 4.5 04/03/2020   CL 101 04/03/2020   CO2 26 04/03/2020   GLUCOSE 102 (H) 04/03/2020   BUN 37 (H) 04/03/2020   CREATININE 2.01 (H) 04/03/2020   CALCIUM 8.5 (L) 04/03/2020   PROT 8.8 (H) 04/03/2020   ALBUMIN 3.4 (L) 04/03/2020   AST 38 04/03/2020   ALT 19 04/03/2020   ALKPHOS 95 04/03/2020   BILITOT 0.7 04/03/2020   GFRNONAA 24 (L) 04/03/2020   GFRAA 33 (L) 11/19/2019    Lab Results  Component Value Date   WBC 3.8 (L) 04/03/2020   NEUTROABS 1.9 04/03/2020   HGB 11.1 (L) 04/03/2020   HCT 33.5 (L) 04/03/2020   MCV 96.8 04/03/2020   PLT 184 04/03/2020      STUDIES: No results found.  ASSESSMENT: Smoldering myeloma  PLAN:   1. Smoldering myeloma: Bone marrow biopsy on December 23, 2011 revealed 15% plasma cells with cytogenetics having monosomy 13 in approximately 7%.  Her most recent skeletal survey on April 10, 2020 reviewed independently with no radiographic sign of multiple myeloma or progressive disease. Her most recent M spike has trended up slightly and is now 3.1.  Previously, this was relatively stable between 2.0 and 2.5.  IgG levels and kappa free light chains have also trended up slightly and are now 3954 and 210.9 respectively. Renal function and anemia are also slightly worse. Proceed with repeat bone marrow biopsy in the next 1 to 2 weeks. Return to clinic 1 week after her biopsy to discuss the results and treatment planning if necessary.  2. Renal insufficiency: Slightly worse. Appreciate nephrology input. Bone marrow biopsy as above.. 3.  Anemia: Hemoglobin has trended down slightly, monitor.  I spent a total of 30 minutes reviewing chart data, face-to-face evaluation with the patient, counseling and coordination  of care as detailed above.    Lloyd Huger, MD   05/14/2020 6:43 AM

## 2020-05-13 ENCOUNTER — Inpatient Hospital Stay: Payer: Medicare Other | Attending: Oncology | Admitting: Oncology

## 2020-05-13 ENCOUNTER — Other Ambulatory Visit: Payer: Self-pay | Admitting: *Deleted

## 2020-05-13 VITALS — BP 123/90 | HR 78 | Temp 96.1°F | Resp 18 | Wt 157.3 lb

## 2020-05-13 DIAGNOSIS — Z803 Family history of malignant neoplasm of breast: Secondary | ICD-10-CM | POA: Insufficient documentation

## 2020-05-13 DIAGNOSIS — D472 Monoclonal gammopathy: Secondary | ICD-10-CM | POA: Insufficient documentation

## 2020-05-13 DIAGNOSIS — Z86010 Personal history of colonic polyps: Secondary | ICD-10-CM | POA: Diagnosis not present

## 2020-05-13 DIAGNOSIS — N189 Chronic kidney disease, unspecified: Secondary | ICD-10-CM | POA: Diagnosis not present

## 2020-05-13 DIAGNOSIS — D649 Anemia, unspecified: Secondary | ICD-10-CM | POA: Diagnosis not present

## 2020-05-13 DIAGNOSIS — C9000 Multiple myeloma not having achieved remission: Secondary | ICD-10-CM | POA: Diagnosis present

## 2020-05-13 DIAGNOSIS — Z79899 Other long term (current) drug therapy: Secondary | ICD-10-CM | POA: Insufficient documentation

## 2020-05-13 DIAGNOSIS — I129 Hypertensive chronic kidney disease with stage 1 through stage 4 chronic kidney disease, or unspecified chronic kidney disease: Secondary | ICD-10-CM | POA: Diagnosis not present

## 2020-05-13 DIAGNOSIS — K219 Gastro-esophageal reflux disease without esophagitis: Secondary | ICD-10-CM | POA: Diagnosis not present

## 2020-05-13 DIAGNOSIS — Z8673 Personal history of transient ischemic attack (TIA), and cerebral infarction without residual deficits: Secondary | ICD-10-CM | POA: Diagnosis not present

## 2020-05-13 DIAGNOSIS — Z87891 Personal history of nicotine dependence: Secondary | ICD-10-CM | POA: Diagnosis not present

## 2020-05-14 ENCOUNTER — Telehealth: Payer: Self-pay | Admitting: Oncology

## 2020-05-14 ENCOUNTER — Telehealth: Payer: Self-pay | Admitting: *Deleted

## 2020-05-14 ENCOUNTER — Other Ambulatory Visit: Payer: Self-pay | Admitting: *Deleted

## 2020-05-14 DIAGNOSIS — D472 Monoclonal gammopathy: Secondary | ICD-10-CM

## 2020-05-14 DIAGNOSIS — C9000 Multiple myeloma not having achieved remission: Secondary | ICD-10-CM

## 2020-05-14 NOTE — Telephone Encounter (Signed)
Patient scheduled for CT guided bone marrow biopsy on Monday 2/14 at 8:30, patient to arrive at 7:30 for procedure. Patient aware of appts and verbalized understanding of appointment details.

## 2020-05-14 NOTE — Telephone Encounter (Signed)
Called pt to schedule appt per RN--confirmed 05/29/20 @ 3:30pm.

## 2020-05-15 ENCOUNTER — Other Ambulatory Visit: Payer: Self-pay | Admitting: Radiology

## 2020-05-16 ENCOUNTER — Other Ambulatory Visit: Payer: Self-pay | Admitting: Student

## 2020-05-16 ENCOUNTER — Other Ambulatory Visit: Payer: Self-pay | Admitting: Radiology

## 2020-05-19 ENCOUNTER — Other Ambulatory Visit: Payer: Self-pay

## 2020-05-19 ENCOUNTER — Ambulatory Visit
Admission: RE | Admit: 2020-05-19 | Discharge: 2020-05-19 | Disposition: A | Discharge status: Home / Self Care | Payer: Medicare Other | Source: Ambulatory Visit | Attending: Oncology | Admitting: Oncology

## 2020-05-19 DIAGNOSIS — C9000 Multiple myeloma not having achieved remission: Secondary | ICD-10-CM | POA: Insufficient documentation

## 2020-05-19 DIAGNOSIS — D472 Monoclonal gammopathy: Secondary | ICD-10-CM

## 2020-05-19 DIAGNOSIS — D649 Anemia, unspecified: Secondary | ICD-10-CM | POA: Insufficient documentation

## 2020-05-19 LAB — CBC WITH DIFFERENTIAL/PLATELET
Abs Immature Granulocytes: 0.02 10*3/uL (ref 0.00–0.07)
Basophils Absolute: 0 10*3/uL (ref 0.0–0.1)
Basophils Relative: 1 %
Eosinophils Absolute: 0.1 10*3/uL (ref 0.0–0.5)
Eosinophils Relative: 1 %
HCT: 34.5 % — ABNORMAL LOW (ref 36.0–46.0)
Hemoglobin: 11.6 g/dL — ABNORMAL LOW (ref 12.0–15.0)
Immature Granulocytes: 0 %
Lymphocytes Relative: 40 %
Lymphs Abs: 1.8 10*3/uL (ref 0.7–4.0)
MCH: 32.1 pg (ref 26.0–34.0)
MCHC: 33.6 g/dL (ref 30.0–36.0)
MCV: 95.6 fL (ref 80.0–100.0)
Monocytes Absolute: 0.4 10*3/uL (ref 0.1–1.0)
Monocytes Relative: 8 %
Neutro Abs: 2.2 10*3/uL (ref 1.7–7.7)
Neutrophils Relative %: 50 %
Platelets: 174 10*3/uL (ref 150–400)
RBC: 3.61 MIL/uL — ABNORMAL LOW (ref 3.87–5.11)
RDW: 12.2 % (ref 11.5–15.5)
WBC: 4.5 10*3/uL (ref 4.0–10.5)
nRBC: 0 % (ref 0.0–0.2)

## 2020-05-19 MED ORDER — HEPARIN SOD (PORK) LOCK FLUSH 100 UNIT/ML IV SOLN
INTRAVENOUS | Status: AC
  Filled 2020-05-19: qty 5

## 2020-05-19 MED ORDER — FENTANYL CITRATE (PF) 100 MCG/2ML IJ SOLN
INTRAMUSCULAR | Status: AC | PRN
  Administered 2020-05-19: 50 ug via INTRAVENOUS

## 2020-05-19 MED ORDER — FENTANYL CITRATE (PF) 100 MCG/2ML IJ SOLN
INTRAMUSCULAR | Status: AC
  Filled 2020-05-19: qty 2

## 2020-05-19 MED ORDER — MIDAZOLAM HCL 2 MG/2ML IJ SOLN
INTRAMUSCULAR | Status: AC | PRN
  Administered 2020-05-19: 1 mg via INTRAVENOUS

## 2020-05-19 MED ORDER — SODIUM CHLORIDE 0.9 % IV SOLN: INTRAVENOUS | Status: DC

## 2020-05-19 MED ORDER — MIDAZOLAM HCL 2 MG/2ML IJ SOLN
INTRAMUSCULAR | Status: AC
  Filled 2020-05-19: qty 2

## 2020-05-19 NOTE — Discharge Instructions (Signed)
Bone Marrow Aspiration and Bone Marrow Biopsy, Adult, Care After This sheet gives you information about how to care for yourself after your procedure. Your health care provider may also give you more specific instructions. If you have problems or questions, contact your health care provider. What can I expect after the procedure? After the procedure, it is common to have:  Mild pain and tenderness.  Swelling.  Bruising. Follow these instructions at home: Puncture site care  Follow instructions from your health care provider about how to take care of the puncture site. Make sure you: ? Wash your hands with soap and water before and after you change your bandage (dressing). If soap and water are not available, use hand sanitizer. ? Change your dressing as told by your health care provider.  Check your puncture site every day for signs of infection. Check for: ? More redness, swelling, or pain. ? Fluid or blood. ? Warmth. ? Pus or a bad smell.   Activity  Return to your normal activities as told by your health care provider. Ask your health care provider what activities are safe for you.  Do not lift anything that is heavier than 10 lb (4.5 kg), or the limit that you are told, until your health care provider says that it is safe.  Do not drive for 24 hours if you were given a sedative during your procedure. General instructions  Take over-the-counter and prescription medicines only as told by your health care provider.  Do not take baths, swim, or use a hot tub until your health care provider approves. Ask your health care provider if you may take showers. You may only be allowed to take sponge baths.  If directed, put ice on the affected area. To do this: ? Put ice in a plastic bag. ? Place a towel between your skin and the bag. ? Leave the ice on for 20 minutes, 2-3 times a day.  Keep all follow-up visits as told by your health care provider. This is important.   Contact a  health care provider if:  Your pain is not controlled with medicine.  You have a fever.  You have more redness, swelling, or pain around the puncture site.  You have fluid or blood coming from the puncture site.  Your puncture site feels warm to the touch.  You have pus or a bad smell coming from the puncture site. Summary  After the procedure, it is common to have mild pain, tenderness, swelling, and bruising.  Follow instructions from your health care provider about how to take care of the puncture site and what activities are safe for you.  Take over-the-counter and prescription medicines only as told by your health care provider.  Contact a health care provider if you have any signs of infection, such as fluid or blood coming from the puncture site. This information is not intended to replace advice given to you by your health care provider. Make sure you discuss any questions you have with your health care provider. Document Revised: 08/08/2018 Document Reviewed: 08/08/2018 Elsevier Patient Education  2021 [employer]  

## 2020-05-19 NOTE — Procedures (Signed)
Interventional Radiology Procedure Note  Procedure: Bone marrow biopsy  Indication: Multiple myeloma  Findings: Please refer to procedural dictation for full description.  Complications: None  EBL: < 10 mL  Miachel Roux, MD [phone]

## 2020-05-19 NOTE — Progress Notes (Signed)
Patient clinically stable post BMB per Dr Dwaine Gale. Awake/alert and oriented post procedure. Received Versed 1 mg along with Fentanyl 67mcg IV for procedure. Report given to Genelle Bal Rn post procedure/specials. Denies complaints at this time.

## 2020-05-19 NOTE — H&P (Signed)
Chief Complaint: Concern for worsening multiple myeloma. Request is for bone marrow biopsy.  Referring Physician(s): Finnegan,Timothy J  Supervising Physician: Mir, Sharen Heck  Patient Status: [facility] - Out-pt  History of Present Illness: Julie Khan is a 86 y.o. female  Smoker, History of bladder cancer, CKD, HTN, CVA, Multiple Myeloma.  Now with worsening renal function. DG Bone Survey Met reads no radiographic sign of multiple myeloma Team is requesting a bone marrow biopsy for further determination of underlying worsening of her multiple myeloma.   Patient states she is comfortable with the procedure since she previously had it at OSH.  Currently without any significant complaints. Patient alert and laying in bed, calm and comfortable. Denies any fevers, headache, chest pain, SOB, cough, abdominal pain, nausea, vomiting or bleeding. Return precautions and treatment recommendations and follow-up discussed with the patient who is agreeable with the plan.  Past Medical History:  Diagnosis Date  . Arthritis   . Bladder cancer ([place])   . Bladder cancer ([place])   . Cancer ([place])    multiple myeloma  . Chronic kidney disease   . Chronic kidney disease   . Dizziness   . GERD (gastroesophageal reflux disease)   . Hypertension   . Multiple myeloma ([place])   . Stroke ([place])    tia x 2  . Tubular adenoma of colon     Past Surgical History:  Procedure Laterality Date  . bladder tumor removed    . BREAST EXCISIONAL BIOPSY Bilateral 1970   Benign  . BREAST SURGERY     bx  . CATARACT EXTRACTION W/PHACO Left 12/16/2016   Procedure: CATARACT EXTRACTION PHACO AND INTRAOCULAR LENS PLACEMENT (IOC);  Surgeon: Eulogio Bear, MD;  Location: [facility] ORS;  Service: Ophthalmology;  Laterality: Left;  [place] 00:38.0 AP% 14.6 CDE 5.54 Fluid pack lot # [id number]  . CATARACT EXTRACTION W/PHACO Right 03/03/2017   Procedure: CATARACT EXTRACTION PHACO AND INTRAOCULAR LENS PLACEMENT ([place]);  Surgeon: Eulogio Bear, MD;  Location: [facility] ORS;  Service: Ophthalmology;  Laterality: Right;  Lot # [id number] H [place]: 00:29.8 AP%: 10.1 CDE: 3.01  . COLONOSCOPY WITH PROPOFOL N/A 07/25/2015   Procedure: COLONOSCOPY WITH PROPOFOL;  Surgeon: Josefine Class, MD;  Location: [facility] ENDOSCOPY;  Service: Endoscopy;  Laterality: N/A;  . COLONOSCOPY WITH PROPOFOL N/A 06/29/2017   Procedure: COLONOSCOPY WITH PROPOFOL;  Surgeon: Toledo, Benay Pike, MD;  Location: [facility] ENDOSCOPY;  Service: Gastroenterology;  Laterality: N/A;  . CORONARY ANGIOGRAPHY N/A 05/24/2019   Procedure: CORONARY ANGIOGRAPHY;  Surgeon: Dionisio David, MD;  Location: [place] CV LAB;  Service: Cardiovascular;  Laterality: N/A;  . ESOPHAGOGASTRODUODENOSCOPY (EGD) WITH PROPOFOL N/A 07/25/2015   Procedure: ESOPHAGOGASTRODUODENOSCOPY (EGD) WITH PROPOFOL;  Surgeon: Josefine Class, MD;  Location[facility] ENDOSCOPY;  Service: Endoscopy;  Laterality: N/A;  . ESOPHAGOGASTRODUODENOSCOPY (EGD) WITH PROPOFOL N/A 06/29/2017   Procedure: ESOPHAGOGASTRODUODENOSCOPY (EGD) WITH PROPOFOL;  Surgeon: Toledo, Benay Pike, MD;  Location: [facility] ENDOSCOPY;  Service: Gastroenterology;  Laterality: N/A;  . HERNIA REPAIR    . LEFT HEART CATH N/A 05/24/2019   Procedure: Left Heart Cath;  Surgeon: Dionisio David, MD;  Location: [place] CV LAB;  Service: Cardiovascular;  Laterality: N/A;    Allergies: Iodine, Sucralfate, and Penicillins  Medications: Prior to Admission medications   Medication Sig Start Date End Date Taking? Authorizing Provider  losartan (COZAAR) 25 MG tablet Take 25 mg by mouth daily.   Yes [provider]  meclizine (ANTIVERT) 25 MG tablet Take 25 mg by mouth 3 (  three) times daily as needed.  10/26/17  Yes [provider]  metoprolol succinate (TOPROL-XL) 25 MG 24 hr tablet Take 50 mg by mouth daily.    Yes [provider]  nitroGLYCERIN (NITROSTAT) 0.3 MG SL tablet Place 1 tablet under the tongue as needed. 05/07/19  Yes  [provider]  PANTOPRAZOLE SODIUM PO Take 40 mg by mouth in the morning and at bedtime.   Yes [provider]  ranolazine (RANEXA) 500 MG 12 hr tablet Take 500 mg by mouth 2 (two) times daily.   Yes [provider]  rOPINIRole (REQUIP) 0.25 MG tablet Take 1 tablet by mouth as needed. 07/20/19  Yes [provider]  rosuvastatin (CRESTOR) 10 MG tablet Take 10 mg by mouth daily. 05/15/19  Yes [provider]  clopidogrel (PLAVIX) 75 MG tablet Take 75 mg by mouth daily. Patient not taking: Reported on 05/19/2020    [provider]  omeprazole (PRILOSEC) 40 MG capsule Take by mouth. Patient not taking: Reported on 05/19/2020 08/14/19 08/13/20  [provider]     Family History  Problem Relation Age of Onset  . Cancer Mother   . Colon cancer Mother   . Colon polyps Mother   . Cancer Sister   . Breast cancer Sister 48  . Cancer Brother   . Bladder Cancer Neg Hx   . Kidney cancer Neg Hx     Social History   Socioeconomic History  . Marital status: Divorced    Spouse name: Not on file  . Number of children: Not on file  . Years of education: Not on file  . Highest education level: Not on file  Occupational History  . Not on file  Tobacco Use  . Smoking status: Former Research scientist (life sciences)  . Smokeless tobacco: Never Used  Vaping Use  . Vaping Use: Never used  Substance and Sexual Activity  . Alcohol use: No  . Drug use: No  . Sexual activity: Not on file    Comment: Single   Other Topics Concern  . Not on file  Social History Narrative  . Not on file   Social Determinants of Health   Financial Resource Strain: Not on file  Food Insecurity: Not on file  Transportation Needs: Not on file  Physical Activity: Not on file  Stress: Not on file  Social Connections: Not on file    Review of Systems: A 12 point ROS discussed and pertinent positives are indicated in the HPI above.  All other systems are negative.  Review of  Systems  Vital Signs: BP (!) 166/94   Pulse 72   Temp 97.7 F (36.5 C)   Resp (!) 22   Ht 5' (1.524 m)   Wt 154 lb (69.9 kg)   SpO2 99%   BMI 30.08 kg/m   Physical Exam  Imaging: No results found.  Labs:  CBC: Recent Labs    05/23/19 1947 05/24/19 0356 11/19/19 1344 04/03/20 1039  WBC 5.6 5.5 5.0 3.8*  HGB 10.1* 9.8* 11.5* 11.1*  HCT 30.8* 29.4* 34.0* 33.5*  PLT 151 157 204 184    COAGS: Recent Labs    05/23/19 0217 05/23/19 1947 05/23/19 2133 05/23/19 2331  INR 0.9 1.1  --   --   APTT 29  --  >160* >160*    BMP: Recent Labs    05/22/19 2351 05/23/19 0455 05/23/19 1947 11/19/19 1344 04/03/20 1039  NA 133* 134* 135 131* 132*  K 4.2 4.5 4.2 4.4  4.5  CL 100 104 106 101 101  CO2 22 26 25 24 26   GLUCOSE 121* 188* 114* 93 102*  BUN 39* 36* 25* 33* 37*  CALCIUM 9.3 8.6* 8.5* 8.7* 8.5*  CREATININE 2.11* 1.75* 1.66* 1.66* 2.01*  GFRNONAA 21* 27* 28* 28* 24*  GFRAA 25* 31* 33* 33*  --     LIVER FUNCTION TESTS: Recent Labs    05/22/19 2351 11/19/19 1344 04/03/20 1039  BILITOT 0.6 0.9 0.7  AST 43* 44* 38  ALT 21 25 19   ALKPHOS 102 105 95  PROT 9.1* 9.3* 8.8*  ALBUMIN 3.7 3.6 3.4*    Assessment and Plan:  86 y.o female outpatient. Smoker, History of bladder cancer, CKD, HTN, CVA, Multiple Myeloma.  Now with worsening renal function. . DG Bone Survey Met reads no radiographic sign of multiple myeloma Team is requesting a bone marrow biopsy for further determination of underlying worsening of her multiple myeloma.   No recent labs.  Patient is currently on plavix. Allergies include Iodine and PCN. NPO since midnight.   Risks and benefits of bone marrow biopsy was discussed with the patient and/or patient's family including, but not limited to bleeding, infection, damage to adjacent structures or low yield requiring additional tests.  All of the questions were answered and there is agreement to proceed.  Consent signed and in  chart.    Thank you for this interesting consult.  I greatly enjoyed meeting Julie Khan and look forward to participating in their care.  A copy of this report was sent to the requesting provider on this date.  Electronically Signed: Jacqualine Mau, NP 05/19/2020, 8:13 AM   I spent a total of  30 Minutes   in face to face in clinical consultation, greater than 50% of which was counseling/coordinating care for bone marrow biopsy

## 2020-05-25 NOTE — Progress Notes (Signed)
[place]  [phone] [phone]  ID: Julie Khan OB: 1934-12-08  MR#: [id number]  ZSW#:[id number]  Patient Care Team: Jodi Marble, MD as PCP - General (Internal Medicine)   CHIEF COMPLAINT: Smoldering myeloma  INTERVAL HISTORY: Patient returns to clinic today for further evaluation and discussion of her bone marrow biopsy results.  She continues to feel well and remains asymptomatic. She denies any weakness or fatigue.  She has no neurologic complaints.  She denies any bony pain.  She denies any recent fevers or illnesses.  She has a good appetite and denies weight loss.  She denies any chest pain, shortness of breath, cough, or hemoptysis.  She denies any nausea, vomiting, constipation, or diarrhea.  She has no urinary complaints.  Patient offers no specific complaints today.  REVIEW OF SYSTEMS:   Review of Systems  Constitutional: Negative.  Negative for fever, malaise/fatigue and weight loss.  Respiratory: Negative.  Negative for cough and shortness of breath.   Cardiovascular: Negative.  Negative for chest pain and leg swelling.  Gastrointestinal: Negative.  Negative for abdominal pain, blood in stool and melena.  Genitourinary: Negative.  Negative for dysuria.  Musculoskeletal: Negative.  Negative for back pain.  Skin: Negative.  Negative for rash.  Neurological: Negative.  Negative for sensory change, focal weakness and weakness.  Psychiatric/Behavioral: Negative.  The patient is not nervous/anxious.     As per HPI. Otherwise, a complete review of systems is negative.  PAST MEDICAL HISTORY: Past Medical History:  Diagnosis Date  . Arthritis   . Bladder cancer ([place])   . Bladder cancer ([place])   . Cancer ([place])    multiple myeloma  . Chronic kidney disease   . Chronic kidney disease   . Dizziness   . GERD (gastroesophageal reflux disease)   . Hypertension   . Multiple myeloma ([place])   . Stroke ([place])    tia x 2  .  Tubular adenoma of colon     PAST SURGICAL HISTORY: Past Surgical History:  Procedure Laterality Date  . bladder tumor removed    . BREAST EXCISIONAL BIOPSY Bilateral 1970   Benign  . BREAST SURGERY     bx  . CATARACT EXTRACTION W/PHACO Left 12/16/2016   Procedure: CATARACT EXTRACTION PHACO AND INTRAOCULAR LENS PLACEMENT (IOC);  Surgeon: Eulogio Bear, MD;  Location: [facility] ORS;  Service: Ophthalmology;  Laterality: Left;  [place] 00:38.0 AP% 14.6 CDE 5.54 Fluid pack lot # [id number]  . CATARACT EXTRACTION W/PHACO Right 03/03/2017   Procedure: CATARACT EXTRACTION PHACO AND INTRAOCULAR LENS PLACEMENT ([place]);  Surgeon: Eulogio Bear, MD;  Location: [facility] ORS;  Service: Ophthalmology;  Laterality: Right;  Lot # [id number] H [place]: 00:29.8 AP%: 10.1 CDE: 3.01  . COLONOSCOPY WITH PROPOFOL N/A 07/25/2015   Procedure: COLONOSCOPY WITH PROPOFOL;  Surgeon: Josefine Class, MD;  Location: [facility] ENDOSCOPY;  Service: Endoscopy;  Laterality: N/A;  . COLONOSCOPY WITH PROPOFOL N/A 06/29/2017   Procedure: COLONOSCOPY WITH PROPOFOL;  Surgeon: Toledo, Benay Pike, MD;  Location: [facility] ENDOSCOPY;  Service: Gastroenterology;  Laterality: N/A;  . CORONARY ANGIOGRAPHY N/A 05/24/2019   Procedure: CORONARY ANGIOGRAPHY;  Surgeon: Dionisio David, MD;  Location: [place] CV LAB;  Service: Cardiovascular;  Laterality: N/A;  . ESOPHAGOGASTRODUODENOSCOPY (EGD) WITH PROPOFOL N/A 07/25/2015   Procedure: ESOPHAGOGASTRODUODENOSCOPY (EGD) WITH PROPOFOL;  Surgeon: Josefine Class, MD;  Location: [facility] ENDOSCOPY;  Service: Endoscopy;  Laterality: N/A;  . ESOPHAGOGASTRODUODENOSCOPY (EGD) WITH PROPOFOL N/A 06/29/2017   Procedure: ESOPHAGOGASTRODUODENOSCOPY (EGD) WITH PROPOFOL;  Surgeon: Toledo, Benay Pike, MD;  Location: [facility] ENDOSCOPY;  Service: Gastroenterology;  Laterality: N/A;  . HERNIA REPAIR    . LEFT HEART CATH N/A 05/24/2019   Procedure: Left Heart Cath;  Surgeon: Dionisio David, MD;  Location: [place] CV LAB;   Service: Cardiovascular;  Laterality: N/A;    FAMILY HISTORY: Family History  Problem Relation Age of Onset  . Cancer Mother   . Colon cancer Mother   . Colon polyps Mother   . Cancer Sister   . Breast cancer Sister 20  . Cancer Brother   . Bladder Cancer Neg Hx   . Kidney cancer Neg Hx     ADVANCED DIRECTIVES (Y/N):  N  HEALTH MAINTENANCE: Social History   Tobacco Use  . Smoking status: Former Research scientist (life sciences)  . Smokeless tobacco: Never Used  Vaping Use  . Vaping Use: Never used  Substance Use Topics  . Alcohol use: No  . Drug use: No     Colonoscopy:  PAP:  Bone density:  Lipid panel:  Allergies  Allergen Reactions  . Iodine     Seizure  Betadine ok Other reaction(s): Other (See Comments) Sneezing Seizure  Betadine ok  . Sucralfate Rash  . Penicillins Other (See Comments) and Rash    Has patient had a PCN reaction causing immediate rash, facial/tongue/throat swelling, SOB or lightheadedness with hypotension: Unknown Has patient had a PCN reaction causing severe rash involving mucus membranes or skin necrosis: Unknown Has patient had a PCN reaction that required hospitalization: Unknown Has patient had a PCN reaction occurring within the last 10 years: Unknown If all of the above answers are "NO", then may proceed with Cephalosporin use.  Other reaction(s): Other (See Comments) Has patient had a PCN reaction causing immediate rash, facial/tongue/throat swelling, SOB or lightheadedness with hypotension: Unknown Has patient had a PCN reaction causing severe rash involving mucus membranes or skin necrosis: Unknown Has patient had a PCN reaction that required hospitalization: Unknown Has patient had a PCN reaction occurring within the last 10 years: Unknown If all of the above answers are "NO", then may proceed with Cephalosporin use. Has patient had a PCN reaction causing immediate rash, facial/tongue/throat swelling, SOB or lightheadedness with hypotension:  Unknown Has patient had a PCN reaction causing severe rash involving mucus membranes or skin necrosis: Unknown Has patient had a PCN reaction that required hospitalization: Unknown Has patient had a PCN reaction occurring within the last 10 years: Unknown If all of the above answers are "NO", then may proceed with Cephalosporin use.    Current Outpatient Medications  Medication Sig Dispense Refill  . losartan (COZAAR) 25 MG tablet Take 25 mg by mouth daily.    . meclizine (ANTIVERT) 25 MG tablet Take 25 mg by mouth 3 (three) times daily as needed.   1  . metoprolol succinate (TOPROL-XL) 25 MG 24 hr tablet Take 50 mg by mouth daily.     . nitroGLYCERIN (NITROSTAT) 0.3 MG SL tablet Place 1 tablet under the tongue as needed.    Marland Kitchen PANTOPRAZOLE SODIUM PO Take 40 mg by mouth in the morning and at bedtime.    . ranolazine (RANEXA) 500 MG 12 hr tablet Take 500 mg by mouth 2 (two) times daily.    Marland Kitchen rOPINIRole (REQUIP) 0.25 MG tablet Take 1 tablet by mouth as needed.    . rosuvastatin (CRESTOR) 10 MG tablet Take 10 mg by mouth daily.    . clopidogrel (PLAVIX) 75 MG tablet Take 75 mg by  mouth daily. (Patient not taking: No sig reported)     No current facility-administered medications for this visit.   Facility-Administered Medications Ordered in Other Visits  Medication Dose Route Frequency Provider Last Rate Last Admin  . sodium chloride flush (NS) 0.9 % injection 3 mL  3 mL Intravenous Q12H Neoma Laming A, MD        OBJECTIVE: Vitals:   05/29/20 1531  BP: (!) 134/92  Pulse: 82  Resp: 16  Temp: (!) 97.4 F (36.3 C)  SpO2: 99%     Body mass index is 30.86 kg/m.    ECOG FS:0 - Asymptomatic  General: Well-developed, well-nourished, no acute distress. Eyes: Pink conjunctiva, anicteric sclera. HEENT: Normocephalic, moist mucous membranes. Lungs: No audible wheezing or coughing. Heart: Regular rate and rhythm. Abdomen: Soft, nontender, no obvious distention. Musculoskeletal: No edema,  cyanosis, or clubbing. Neuro: Alert, answering all questions appropriately. Cranial nerves grossly intact. Skin: No rashes or petechiae noted. Psych: Normal affect.   LAB RESULTS:  Lab Results  Component Value Date   NA 132 (L) 04/03/2020   K 4.5 04/03/2020   CL 101 04/03/2020   CO2 26 04/03/2020   GLUCOSE 102 (H) 04/03/2020   BUN 37 (H) 04/03/2020   CREATININE 2.01 (H) 04/03/2020   CALCIUM 8.5 (L) 04/03/2020   PROT 8.8 (H) 04/03/2020   ALBUMIN 3.4 (L) 04/03/2020   AST 38 04/03/2020   ALT 19 04/03/2020   ALKPHOS 95 04/03/2020   BILITOT 0.7 04/03/2020   GFRNONAA 24 (L) 04/03/2020   GFRAA 33 (L) 11/19/2019    Lab Results  Component Value Date   WBC 4.5 05/19/2020   NEUTROABS 2.2 05/19/2020   HGB 11.6 (L) 05/19/2020   HCT 34.5 (L) 05/19/2020   MCV 95.6 05/19/2020   PLT 174 05/19/2020      STUDIES: CT BONE MARROW BIOPSY  Result Date: 05/19/2020 INDICATION: Multiple myeloma EXAM: CT GUIDED BONE MARROW ASPIRATES AND BIOPSY Physician: Stephan Minister. Anselm Pancoast, MD MEDICATIONS: None. ANESTHESIA/SEDATION: Fentanyl 50 mcg IV; Versed 1 mg IV Moderate Sedation Time:  10 minutes The patient was continuously monitored during the procedure by the interventional radiology nurse under my direct supervision. COMPLICATIONS: None immediate. PROCEDURE: The procedure was explained to the patient. The risks and benefits of the procedure were discussed and the patient's questions were addressed. Informed consent was obtained from the patient. The patient was placed prone on CT table. Images of the pelvis were obtained. The right side of back was prepped and draped in sterile fashion. The skin and right posterior ilium were anesthetized with 1% lidocaine. 11 gauge bone needle was directed into the right ilium with CT guidance. Two aspirates and 1 core biopsy were obtained. Bandage placed over the puncture site. IMPRESSION: CT guided bone marrow aspiration and core biopsy. Electronically Signed   By: Miachel Roux M.D.   On: 05/19/2020 13:48    ASSESSMENT: Smoldering myeloma  PLAN:   1. Smoldering myeloma: Bone marrow biopsy on December 23, 2011 revealed 15% plasma cells with cytogenetics having monosomy 13 in approximately 7%.  Repeat bone mineral density on May 19, 2020 was essentially unchanged with approximately 15% plasma cells with monosomy 13 cytogenetics.  Her most recent skeletal survey on April 10, 2020 reviewed independently with no radiographic sign of multiple myeloma or progressive disease.  Despite an unchanged bone marrow, her most recent M spike has trended up slightly and is now 3.1.  Previously, this was relatively stable between 2.0 and 2.5.  IgG levels  and kappa free light chains have also trended up slightly and are now 3954 and 210.9 respectively. Renal function and anemia are also slightly worse.  Case was discussed with nephrology and there was still concern for possible myeloma kidney therefore will add urine SPEP as well as 24-hour urine for completeness.  Return to clinic as previously scheduled in April 2022 with laboratory work and further evaluation. 2. Renal insufficiency: Slightly worse. Appreciate nephrology input. Bone marrow biopsy results as above.. 3.  Anemia: Hemoglobin has trended down slightly, monitor.  I spent a total of 30 minutes reviewing chart data, face-to-face evaluation with the patient, counseling and coordination of care as detailed above.   Lloyd Huger, MD   05/30/2020 6:39 AM

## 2020-05-29 ENCOUNTER — Encounter (HOSPITAL_COMMUNITY): Payer: Self-pay | Admitting: Oncology

## 2020-05-29 ENCOUNTER — Inpatient Hospital Stay (HOSPITAL_BASED_OUTPATIENT_CLINIC_OR_DEPARTMENT_OTHER): Payer: Medicare Other | Admitting: Oncology

## 2020-05-29 ENCOUNTER — Encounter: Payer: Self-pay | Admitting: Oncology

## 2020-05-29 VITALS — BP 134/92 | HR 82 | Temp 97.4°F | Resp 16 | Ht 60.0 in | Wt 158.0 lb

## 2020-05-29 DIAGNOSIS — D472 Monoclonal gammopathy: Secondary | ICD-10-CM

## 2020-05-29 DIAGNOSIS — C9000 Multiple myeloma not having achieved remission: Secondary | ICD-10-CM

## 2020-05-29 LAB — SURGICAL PATHOLOGY

## 2020-06-10 ENCOUNTER — Inpatient Hospital Stay: Payer: Medicare Other | Attending: Oncology

## 2020-06-10 ENCOUNTER — Other Ambulatory Visit: Payer: Self-pay

## 2020-06-10 DIAGNOSIS — C9000 Multiple myeloma not having achieved remission: Secondary | ICD-10-CM | POA: Diagnosis present

## 2020-06-10 DIAGNOSIS — D472 Monoclonal gammopathy: Secondary | ICD-10-CM

## 2020-06-10 LAB — COMPREHENSIVE METABOLIC PANEL
ALT: 18 U/L (ref 0–44)
AST: 36 U/L (ref 15–41)
Albumin: 3.7 g/dL (ref 3.5–5.0)
Alkaline Phosphatase: 94 U/L (ref 38–126)
Anion gap: 7 (ref 5–15)
BUN: 36 mg/dL — ABNORMAL HIGH (ref 8–23)
CO2: 24 mmol/L (ref 22–32)
Calcium: 8.8 mg/dL — ABNORMAL LOW (ref 8.9–10.3)
Chloride: 98 mmol/L (ref 98–111)
Creatinine, Ser: 1.8 mg/dL — ABNORMAL HIGH (ref 0.44–1.00)
GFR, Estimated: 28 mL/min — ABNORMAL LOW (ref >60)
Glucose, Bld: 72 mg/dL (ref 70–99)
Potassium: 4.4 mmol/L (ref 3.5–5.1)
Sodium: 129 mmol/L — ABNORMAL LOW (ref 135–145)
Total Bilirubin: 0.9 mg/dL (ref 0.3–1.2)
Total Protein: 9.8 g/dL — ABNORMAL HIGH (ref 6.5–8.1)

## 2020-06-10 LAB — CBC WITH DIFFERENTIAL/PLATELET
Abs Immature Granulocytes: 0.01 10*3/uL (ref 0.00–0.07)
Basophils Absolute: 0 10*3/uL (ref 0.0–0.1)
Basophils Relative: 1 %
Eosinophils Absolute: 0 10*3/uL (ref 0.0–0.5)
Eosinophils Relative: 1 %
HCT: 35.7 % — ABNORMAL LOW (ref 36.0–46.0)
Hemoglobin: 11.9 g/dL — ABNORMAL LOW (ref 12.0–15.0)
Immature Granulocytes: 0 %
Lymphocytes Relative: 36 %
Lymphs Abs: 1.5 10*3/uL (ref 0.7–4.0)
MCH: 32 pg (ref 26.0–34.0)
MCHC: 33.3 g/dL (ref 30.0–36.0)
MCV: 96 fL (ref 80.0–100.0)
Monocytes Absolute: 0.4 10*3/uL (ref 0.1–1.0)
Monocytes Relative: 10 %
Neutro Abs: 2.1 10*3/uL (ref 1.7–7.7)
Neutrophils Relative %: 52 %
Platelets: 196 10*3/uL (ref 150–400)
RBC: 3.72 MIL/uL — ABNORMAL LOW (ref 3.87–5.11)
RDW: 12.5 % (ref 11.5–15.5)
WBC: 4 10*3/uL (ref 4.0–10.5)
nRBC: 0 % (ref 0.0–0.2)

## 2020-06-11 ENCOUNTER — Other Ambulatory Visit: Payer: Self-pay

## 2020-06-11 DIAGNOSIS — C9000 Multiple myeloma not having achieved remission: Secondary | ICD-10-CM

## 2020-06-11 DIAGNOSIS — D472 Monoclonal gammopathy: Secondary | ICD-10-CM

## 2020-06-11 LAB — KAPPA/LAMBDA LIGHT CHAINS
Kappa free light chain: 202.3 mg/L — ABNORMAL HIGH (ref 3.3–19.4)
Kappa, lambda light chain ratio: 20.43 — ABNORMAL HIGH (ref 0.26–1.65)
Lambda free light chains: 9.9 mg/L (ref 5.7–26.3)

## 2020-06-11 LAB — IGG, IGA, IGM
IgA: 73 mg/dL (ref 64–422)
IgG (Immunoglobin G), Serum: 4184 mg/dL — ABNORMAL HIGH (ref 586–1602)
IgM (Immunoglobulin M), Srm: 98 mg/dL (ref 26–217)

## 2020-06-12 LAB — PROTEIN ELECTRO, RANDOM URINE
Albumin ELP, Urine: 100 %
Alpha-1-Globulin, U: 0 %
Alpha-2-Globulin, U: 0 %
Beta Globulin, U: 0 %
Gamma Globulin, U: 0 %
Total Protein, Urine: 6.7 mg/dL

## 2020-06-13 LAB — UIFE/LIGHT CHAINS/TP QN, 24-HR UR
FR KAPPA LT CH,24HR: 6.13 mg/24 hr
FR LAMBDA LT CH,24HR: <0.8 mg/24 hr
Free Kappa Lt Chains,Ur: 5.11 mg/L (ref 0.63–113.79)
Free Kappa/Lambda Ratio: >7.63 (ref 1.03–31.76)
Free Lambda Lt Chains,Ur: <0.67 mg/L (ref 0.47–11.77)
Total Protein, Urine-Ur/day: 49 mg/24 hr (ref 30–150)
Total Protein, Urine: 4.1 mg/dL

## 2020-06-13 LAB — MULTIPLE MYELOMA PANEL, SERUM
Albumin SerPl Elph-Mcnc: 3.6 g/dL (ref 2.9–4.4)
Albumin/Glob SerPl: 0.7 (ref 0.7–1.7)
Alpha 1: 0.3 g/dL (ref 0.0–0.4)
Alpha2 Glob SerPl Elph-Mcnc: 0.8 g/dL (ref 0.4–1.0)
B-Globulin SerPl Elph-Mcnc: 1.1 g/dL (ref 0.7–1.3)
Gamma Glob SerPl Elph-Mcnc: 3.7 g/dL — ABNORMAL HIGH (ref 0.4–1.8)
Globulin, Total: 5.9 g/dL — ABNORMAL HIGH (ref 2.2–3.9)
IgA: 70 mg/dL (ref 64–422)
IgG (Immunoglobin G), Serum: 4365 mg/dL — ABNORMAL HIGH (ref 586–1602)
IgM (Immunoglobulin M), Srm: 86 mg/dL (ref 26–217)
M Protein SerPl Elph-Mcnc: 3.2 g/dL — ABNORMAL HIGH
Total Protein ELP: 9.5 g/dL — ABNORMAL HIGH (ref 6.0–8.5)

## 2020-07-22 ENCOUNTER — Other Ambulatory Visit: Payer: Self-pay

## 2020-07-22 ENCOUNTER — Inpatient Hospital Stay: Payer: Medicare Other | Attending: Oncology

## 2020-07-22 DIAGNOSIS — Z87891 Personal history of nicotine dependence: Secondary | ICD-10-CM | POA: Diagnosis not present

## 2020-07-22 DIAGNOSIS — Z79899 Other long term (current) drug therapy: Secondary | ICD-10-CM | POA: Insufficient documentation

## 2020-07-22 DIAGNOSIS — Z86010 Personal history of colonic polyps: Secondary | ICD-10-CM | POA: Insufficient documentation

## 2020-07-22 DIAGNOSIS — Z8673 Personal history of transient ischemic attack (TIA), and cerebral infarction without residual deficits: Secondary | ICD-10-CM | POA: Insufficient documentation

## 2020-07-22 DIAGNOSIS — D472 Monoclonal gammopathy: Secondary | ICD-10-CM | POA: Diagnosis not present

## 2020-07-22 DIAGNOSIS — D649 Anemia, unspecified: Secondary | ICD-10-CM | POA: Insufficient documentation

## 2020-07-22 DIAGNOSIS — I129 Hypertensive chronic kidney disease with stage 1 through stage 4 chronic kidney disease, or unspecified chronic kidney disease: Secondary | ICD-10-CM | POA: Diagnosis not present

## 2020-07-22 DIAGNOSIS — C9000 Multiple myeloma not having achieved remission: Secondary | ICD-10-CM

## 2020-07-22 DIAGNOSIS — K219 Gastro-esophageal reflux disease without esophagitis: Secondary | ICD-10-CM | POA: Diagnosis not present

## 2020-07-22 DIAGNOSIS — N189 Chronic kidney disease, unspecified: Secondary | ICD-10-CM | POA: Insufficient documentation

## 2020-07-22 LAB — CBC WITH DIFFERENTIAL/PLATELET
Abs Immature Granulocytes: 0.01 10*3/uL (ref 0.00–0.07)
Basophils Absolute: 0 10*3/uL (ref 0.0–0.1)
Basophils Relative: 1 %
Eosinophils Absolute: 0 10*3/uL (ref 0.0–0.5)
Eosinophils Relative: 1 %
HCT: 31.7 % — ABNORMAL LOW (ref 36.0–46.0)
Hemoglobin: 10.5 g/dL — ABNORMAL LOW (ref 12.0–15.0)
Immature Granulocytes: 0 %
Lymphocytes Relative: 33 %
Lymphs Abs: 1.4 10*3/uL (ref 0.7–4.0)
MCH: 32.3 pg (ref 26.0–34.0)
MCHC: 33.1 g/dL (ref 30.0–36.0)
MCV: 97.5 fL (ref 80.0–100.0)
Monocytes Absolute: 0.3 10*3/uL (ref 0.1–1.0)
Monocytes Relative: 7 %
Neutro Abs: 2.6 10*3/uL (ref 1.7–7.7)
Neutrophils Relative %: 58 %
Platelets: 204 10*3/uL (ref 150–400)
RBC: 3.25 MIL/uL — ABNORMAL LOW (ref 3.87–5.11)
RDW: 13 % (ref 11.5–15.5)
WBC: 4.4 10*3/uL (ref 4.0–10.5)
nRBC: 0 % (ref 0.0–0.2)

## 2020-07-22 LAB — COMPREHENSIVE METABOLIC PANEL
ALT: 22 U/L (ref 0–44)
AST: 39 U/L (ref 15–41)
Albumin: 3.4 g/dL — ABNORMAL LOW (ref 3.5–5.0)
Alkaline Phosphatase: 104 U/L (ref 38–126)
Anion gap: 7 (ref 5–15)
BUN: 29 mg/dL — ABNORMAL HIGH (ref 8–23)
CO2: 24 mmol/L (ref 22–32)
Calcium: 8.7 mg/dL — ABNORMAL LOW (ref 8.9–10.3)
Chloride: 101 mmol/L (ref 98–111)
Creatinine, Ser: 1.37 mg/dL — ABNORMAL HIGH (ref 0.44–1.00)
GFR, Estimated: 38 mL/min — ABNORMAL LOW (ref >60)
Glucose, Bld: 111 mg/dL — ABNORMAL HIGH (ref 70–99)
Potassium: 4.4 mmol/L (ref 3.5–5.1)
Sodium: 132 mmol/L — ABNORMAL LOW (ref 135–145)
Total Bilirubin: 0.6 mg/dL (ref 0.3–1.2)
Total Protein: 9.2 g/dL — ABNORMAL HIGH (ref 6.5–8.1)

## 2020-07-23 LAB — IGG, IGA, IGM
IgA: 67 mg/dL (ref 64–422)
IgG (Immunoglobin G), Serum: 4123 mg/dL — ABNORMAL HIGH (ref 586–1602)
IgM (Immunoglobulin M), Srm: 81 mg/dL (ref 26–217)

## 2020-07-23 LAB — KAPPA/LAMBDA LIGHT CHAINS
Kappa free light chain: 158 mg/L — ABNORMAL HIGH (ref 3.3–19.4)
Kappa, lambda light chain ratio: 16.12 — ABNORMAL HIGH (ref 0.26–1.65)
Lambda free light chains: 9.8 mg/L (ref 5.7–26.3)

## 2020-07-24 LAB — MULTIPLE MYELOMA PANEL, SERUM
Albumin SerPl Elph-Mcnc: 3.5 g/dL (ref 2.9–4.4)
Albumin/Glob SerPl: 0.7 (ref 0.7–1.7)
Alpha 1: 0.2 g/dL (ref 0.0–0.4)
Alpha2 Glob SerPl Elph-Mcnc: 0.7 g/dL (ref 0.4–1.0)
B-Globulin SerPl Elph-Mcnc: 0.9 g/dL (ref 0.7–1.3)
Gamma Glob SerPl Elph-Mcnc: 3.4 g/dL — ABNORMAL HIGH (ref 0.4–1.8)
Globulin, Total: 5.3 g/dL — ABNORMAL HIGH (ref 2.2–3.9)
IgA: 70 mg/dL (ref 64–422)
IgG (Immunoglobin G), Serum: 4107 mg/dL — ABNORMAL HIGH (ref 586–1602)
IgM (Immunoglobulin M), Srm: 78 mg/dL (ref 26–217)
M Protein SerPl Elph-Mcnc: 3.2 g/dL — ABNORMAL HIGH
Total Protein ELP: 8.8 g/dL — ABNORMAL HIGH (ref 6.0–8.5)

## 2020-07-25 NOTE — Progress Notes (Signed)
[place]  Telephone:([phone] [phone]  ID: Julie Khan OB: [birth year]  MR#: [id number]  VEH#:[id number]  Patient Care Team: Jodi Marble, MD as PCP - General (Internal Medicine)   CHIEF COMPLAINT: Smoldering myeloma  INTERVAL HISTORY: Patient returns to clinic today for repeat laboratory work and further evaluation.  She continues to feel well and remains asymptomatic and is planning to go back to [place] for 2 months next week.  She denies any weakness or fatigue.  She has no neurologic complaints.  She denies any bony pain.  She denies any recent fevers or illnesses.  She has a good appetite and denies weight loss.  She denies any chest pain, shortness of breath, cough, or hemoptysis.  She denies any nausea, vomiting, constipation, or diarrhea.  She has no urinary complaints.  Patient offers no specific complaints today.  REVIEW OF SYSTEMS:   Review of Systems  Constitutional: Negative.  Negative for fever, malaise/fatigue and weight loss.  Respiratory: Negative.  Negative for cough and shortness of breath.   Cardiovascular: Negative.  Negative for chest pain and leg swelling.  Gastrointestinal: Negative.  Negative for abdominal pain, blood in stool and melena.  Genitourinary: Negative.  Negative for dysuria.  Musculoskeletal: Negative.  Negative for back pain.  Skin: Negative.  Negative for rash.  Neurological: Negative.  Negative for sensory change, focal weakness and weakness.  Psychiatric/Behavioral: Negative.  The patient is not nervous/anxious.     As per HPI. Otherwise, a complete review of systems is negative.  PAST MEDICAL HISTORY: Past Medical History:  Diagnosis Date  . Arthritis   . Bladder cancer ([place])   . Bladder cancer ([place])   . Cancer ([place])    multiple myeloma  . Chronic kidney disease   . Chronic kidney disease   . Dizziness   . GERD (gastroesophageal reflux disease)   . Hypertension   . Multiple myeloma ([place])    . Stroke ([place])    tia x 2  . Tubular adenoma of colon     PAST SURGICAL HISTORY: Past Surgical History:  Procedure Laterality Date  . bladder tumor removed    . BREAST EXCISIONAL BIOPSY Bilateral 1970   Benign  . BREAST SURGERY     bx  . CATARACT EXTRACTION W/PHACO Left 12/16/2016   Procedure: CATARACT EXTRACTION PHACO AND INTRAOCULAR LENS PLACEMENT (IOC);  Surgeon: Eulogio Bear, MD;  Location: [facility] ORS;  Service: Ophthalmology;  Laterality: Left;  [place] 00:38.0 AP% 14.6 CDE 5.54 Fluid pack lot # [id number]  . CATARACT EXTRACTION W/PHACO Right 03/03/2017   Procedure: CATARACT EXTRACTION PHACO AND INTRAOCULAR LENS PLACEMENT ([place]);  Surgeon: Eulogio Bear, MD;  Location: [facility] ORS;  Service: Ophthalmology;  Laterality: Right;  Lot # [id number] H [place]: 00:29.8 AP%: 10.1 CDE: 3.01  . COLONOSCOPY WITH PROPOFOL N/A 07/25/2015   Procedure: COLONOSCOPY WITH PROPOFOL;  Surgeon: Josefine Class, MD;  Location: [facility] ENDOSCOPY;  Service: Endoscopy;  Laterality: N/A;  . COLONOSCOPY WITH PROPOFOL N/A 06/29/2017   Procedure: COLONOSCOPY WITH PROPOFOL;  Surgeon: Toledo, Benay Pike, MD;  Location: [facility] ENDOSCOPY;  Service: Gastroenterology;  Laterality: N/A;  . CORONARY ANGIOGRAPHY N/A 05/24/2019   Procedure: CORONARY ANGIOGRAPHY;  Surgeon: Dionisio David, MD;  Location: [place] CV LAB;  Service: Cardiovascular;  Laterality: N/A;  . ESOPHAGOGASTRODUODENOSCOPY (EGD) WITH PROPOFOL N/A 07/25/2015   Procedure: ESOPHAGOGASTRODUODENOSCOPY (EGD) WITH PROPOFOL;  Surgeon: Josefine Class, MD;  Location: [facility] ENDOSCOPY;  Service: Endoscopy;  Laterality: N/A;  . ESOPHAGOGASTRODUODENOSCOPY (EGD) WITH  PROPOFOL N/A 06/29/2017   Procedure: ESOPHAGOGASTRODUODENOSCOPY (EGD) WITH PROPOFOL;  Surgeon: Toledo, Benay Pike, MD;  Location: [facility] ENDOSCOPY;  Service: Gastroenterology;  Laterality: N/A;  . HERNIA REPAIR    . LEFT HEART CATH N/A 05/24/2019   Procedure: Left Heart Cath;  Surgeon: Dionisio David, MD;   Location: [place] CV LAB;  Service: Cardiovascular;  Laterality: N/A;    FAMILY HISTORY: Family History  Problem Relation Age of Onset  . Cancer Mother   . Colon cancer Mother   . Colon polyps Mother   . Cancer Sister   . Breast cancer Sister 79  . Cancer Brother   . Bladder Cancer Neg Hx   . Kidney cancer Neg Hx     ADVANCED DIRECTIVES (Y/N):  N  HEALTH MAINTENANCE: Social History   Tobacco Use  . Smoking status: Former Research scientist (life sciences)  . Smokeless tobacco: Never Used  Vaping Use  . Vaping Use: Never used  Substance Use Topics  . Alcohol use: No  . Drug use: No     Colonoscopy:  PAP:  Bone density:  Lipid panel:  Allergies  Allergen Reactions  . Iodine     Seizure  Betadine ok Other reaction(s): Other (See Comments) Sneezing Seizure  Betadine ok  . Sucralfate Rash  . Penicillins Other (See Comments) and Rash    Has patient had a PCN reaction causing immediate rash, facial/tongue/throat swelling, SOB or lightheadedness with hypotension: Unknown Has patient had a PCN reaction causing severe rash involving mucus membranes or skin necrosis: Unknown Has patient had a PCN reaction that required hospitalization: Unknown Has patient had a PCN reaction occurring within the last 10 years: Unknown If all of the above answers are "NO", then may proceed with Cephalosporin use.  Other reaction(s): Other (See Comments) Has patient had a PCN reaction causing immediate rash, facial/tongue/throat swelling, SOB or lightheadedness with hypotension: Unknown Has patient had a PCN reaction causing severe rash involving mucus membranes or skin necrosis: Unknown Has patient had a PCN reaction that required hospitalization: Unknown Has patient had a PCN reaction occurring within the last 10 years: Unknown If all of the above answers are "NO", then may proceed with Cephalosporin use. Has patient had a PCN reaction causing immediate rash, facial/tongue/throat swelling, SOB or  lightheadedness with hypotension: Unknown Has patient had a PCN reaction causing severe rash involving mucus membranes or skin necrosis: Unknown Has patient had a PCN reaction that required hospitalization: Unknown Has patient had a PCN reaction occurring within the last 10 years: Unknown If all of the above answers are "NO", then may proceed with Cephalosporin use.    Current Outpatient Medications  Medication Sig Dispense Refill  . losartan (COZAAR) 25 MG tablet Take 25 mg by mouth daily.    . meclizine (ANTIVERT) 25 MG tablet Take 25 mg by mouth 3 (three) times daily as needed.   1  . metoprolol succinate (TOPROL-XL) 25 MG 24 hr tablet Take 50 mg by mouth daily.     . nitroGLYCERIN (NITROSTAT) 0.3 MG SL tablet Place 1 tablet under the tongue as needed.    Marland Kitchen PANTOPRAZOLE SODIUM PO Take 40 mg by mouth in the morning and at bedtime.    . ranolazine (RANEXA) 500 MG 12 hr tablet Take 500 mg by mouth 2 (two) times daily.    Marland Kitchen rOPINIRole (REQUIP) 0.25 MG tablet Take 1 tablet by mouth as needed.    . rosuvastatin (CRESTOR) 10 MG tablet Take 10 mg by mouth daily.    Marland Kitchen  clopidogrel (PLAVIX) 75 MG tablet Take 75 mg by mouth daily. (Patient not taking: No sig reported)     No current facility-administered medications for this visit.   Facility-Administered Medications Ordered in Other Visits  Medication Dose Route Frequency Provider Last Rate Last Admin  . sodium chloride flush (NS) 0.9 % injection 3 mL  3 mL Intravenous Q12H Neoma Laming A, MD        OBJECTIVE: Vitals:   07/29/20 1357 07/29/20 1359  BP:  (!) 145/83  Pulse:  82  Resp: 20   Temp: (!) 97.2 F (36.2 C)      Body mass index is 30.64 kg/m.    ECOG FS:0 - Asymptomatic  General: Well-developed, well-nourished, no acute distress. Eyes: Pink conjunctiva, anicteric sclera. HEENT: Normocephalic, moist mucous membranes. Lungs: No audible wheezing or coughing. Heart: Regular rate and rhythm. Abdomen: Soft, nontender, no obvious  distention. Musculoskeletal: No edema, cyanosis, or clubbing. Neuro: Alert, answering all questions appropriately. Cranial nerves grossly intact. Skin: No rashes or petechiae noted. Psych: Normal affect.  LAB RESULTS:  Lab Results  Component Value Date   NA 132 (L) 07/22/2020   K 4.4 07/22/2020   CL 101 07/22/2020   CO2 24 07/22/2020   GLUCOSE 111 (H) 07/22/2020   BUN 29 (H) 07/22/2020   CREATININE 1.37 (H) 07/22/2020   CALCIUM 8.7 (L) 07/22/2020   PROT 9.2 (H) 07/22/2020   ALBUMIN 3.4 (L) 07/22/2020   AST 39 07/22/2020   ALT 22 07/22/2020   ALKPHOS 104 07/22/2020   BILITOT 0.6 07/22/2020   GFRNONAA 38 (L) 07/22/2020   GFRAA 33 (L) 11/19/2019    Lab Results  Component Value Date   WBC 4.4 07/22/2020   NEUTROABS 2.6 07/22/2020   HGB 10.5 (L) 07/22/2020   HCT 31.7 (L) 07/22/2020   MCV 97.5 07/22/2020   PLT 204 07/22/2020      STUDIES: No results found.  ASSESSMENT: Smoldering myeloma  PLAN:   1. Smoldering myeloma: Bone marrow biopsy on December 23, 2011 revealed 15% plasma cells with cytogenetics having monosomy 13 in approximately 7%.  Repeat bone marrow biopsy on May 19, 2020 was essentially unchanged with approximately 15% plasma cells with monosomy 13 cytogenetics.  Her most recent skeletal survey on April 10, 2020 reviewed independently with no radiographic sign of multiple myeloma or progressive disease.  Despite an unchanged bone marrow, her most recent M spike has trended up slightly and is now 3.2.  Previously, this was relatively stable between 2.0 and 2.5.  IgG levels  have also trended up slightly and are now 4107, but kappa free light chains have improved and are 158.0.  Recent 24-hour UPEP did not reveal an M spike or significantly elevated kappa chains.  Kappa/lambda ratio was greater than 7.63.  Kidney function has improved, although she is slightly more anemic today.  No intervention is needed.  Return to clinic in 3 months with repeat laboratory  work and further evaluation.   2. Renal insufficiency: Patient's creatinine slightly improved to 1.37. Appreciate nephrology input. Bone marrow biopsy results as above. 3.  Anemia: Hemoglobin has trended down slightly and is now 10.5.  Monitor.    Lloyd Huger, MD   07/30/2020 6:53 AM

## 2020-07-28 DIAGNOSIS — R0602 Shortness of breath: Secondary | ICD-10-CM | POA: Diagnosis not present

## 2020-07-28 DIAGNOSIS — I824Y2 Acute embolism and thrombosis of unspecified deep veins of left proximal lower extremity: Secondary | ICD-10-CM | POA: Diagnosis not present

## 2020-07-28 DIAGNOSIS — I509 Heart failure, unspecified: Secondary | ICD-10-CM | POA: Diagnosis not present

## 2020-07-28 DIAGNOSIS — R609 Edema, unspecified: Secondary | ICD-10-CM | POA: Diagnosis not present

## 2020-07-29 ENCOUNTER — Inpatient Hospital Stay (HOSPITAL_BASED_OUTPATIENT_CLINIC_OR_DEPARTMENT_OTHER): Payer: Medicare Other | Admitting: Oncology

## 2020-07-29 ENCOUNTER — Other Ambulatory Visit: Payer: Self-pay

## 2020-07-29 ENCOUNTER — Encounter: Payer: Self-pay | Admitting: Oncology

## 2020-07-29 VITALS — BP 145/83 | HR 82 | Temp 97.2°F | Resp 20 | Wt 156.9 lb

## 2020-07-29 DIAGNOSIS — D649 Anemia, unspecified: Secondary | ICD-10-CM | POA: Diagnosis not present

## 2020-07-29 DIAGNOSIS — K219 Gastro-esophageal reflux disease without esophagitis: Secondary | ICD-10-CM | POA: Diagnosis not present

## 2020-07-29 DIAGNOSIS — Z87891 Personal history of nicotine dependence: Secondary | ICD-10-CM | POA: Diagnosis not present

## 2020-07-29 DIAGNOSIS — D472 Monoclonal gammopathy: Secondary | ICD-10-CM | POA: Diagnosis not present

## 2020-07-29 DIAGNOSIS — C9000 Multiple myeloma not having achieved remission: Secondary | ICD-10-CM | POA: Diagnosis not present

## 2020-07-29 DIAGNOSIS — Z86010 Personal history of colonic polyps: Secondary | ICD-10-CM | POA: Diagnosis not present

## 2020-07-29 DIAGNOSIS — Z8673 Personal history of transient ischemic attack (TIA), and cerebral infarction without residual deficits: Secondary | ICD-10-CM | POA: Diagnosis not present

## 2020-07-29 DIAGNOSIS — I129 Hypertensive chronic kidney disease with stage 1 through stage 4 chronic kidney disease, or unspecified chronic kidney disease: Secondary | ICD-10-CM | POA: Diagnosis not present

## 2020-07-29 DIAGNOSIS — Z79899 Other long term (current) drug therapy: Secondary | ICD-10-CM | POA: Diagnosis not present

## 2020-07-29 DIAGNOSIS — N189 Chronic kidney disease, unspecified: Secondary | ICD-10-CM | POA: Diagnosis not present

## 2020-07-29 NOTE — Progress Notes (Signed)
Patient denies any concerns today.  

## 2020-07-31 DIAGNOSIS — C9001 Multiple myeloma in remission: Secondary | ICD-10-CM | POA: Diagnosis not present

## 2020-07-31 DIAGNOSIS — N1832 Chronic kidney disease, stage 3b: Secondary | ICD-10-CM | POA: Diagnosis not present

## 2020-07-31 DIAGNOSIS — R809 Proteinuria, unspecified: Secondary | ICD-10-CM | POA: Diagnosis not present

## 2020-07-31 DIAGNOSIS — I10 Essential (primary) hypertension: Secondary | ICD-10-CM | POA: Diagnosis not present

## 2020-07-31 DIAGNOSIS — D631 Anemia in chronic kidney disease: Secondary | ICD-10-CM | POA: Diagnosis not present

## 2020-07-31 DIAGNOSIS — N2581 Secondary hyperparathyroidism of renal origin: Secondary | ICD-10-CM | POA: Diagnosis not present

## 2020-08-04 DIAGNOSIS — R0602 Shortness of breath: Secondary | ICD-10-CM | POA: Diagnosis not present

## 2020-08-06 DIAGNOSIS — I10 Essential (primary) hypertension: Secondary | ICD-10-CM | POA: Diagnosis not present

## 2020-08-06 DIAGNOSIS — R0602 Shortness of breath: Secondary | ICD-10-CM | POA: Diagnosis not present

## 2020-08-06 DIAGNOSIS — I340 Nonrheumatic mitral (valve) insufficiency: Secondary | ICD-10-CM | POA: Diagnosis not present

## 2020-08-06 DIAGNOSIS — I824Y2 Acute embolism and thrombosis of unspecified deep veins of left proximal lower extremity: Secondary | ICD-10-CM | POA: Diagnosis not present

## 2020-08-07 ENCOUNTER — Other Ambulatory Visit: Payer: Medicare Other

## 2020-08-08 DIAGNOSIS — I10 Essential (primary) hypertension: Secondary | ICD-10-CM | POA: Diagnosis not present

## 2020-08-08 DIAGNOSIS — R0602 Shortness of breath: Secondary | ICD-10-CM | POA: Diagnosis not present

## 2020-08-08 DIAGNOSIS — I340 Nonrheumatic mitral (valve) insufficiency: Secondary | ICD-10-CM | POA: Diagnosis not present

## 2020-08-14 ENCOUNTER — Ambulatory Visit: Payer: Medicare Other | Admitting: Oncology

## 2020-09-30 DIAGNOSIS — N183 Chronic kidney disease, stage 3 unspecified: Secondary | ICD-10-CM | POA: Diagnosis not present

## 2020-09-30 DIAGNOSIS — L298 Other pruritus: Secondary | ICD-10-CM | POA: Diagnosis not present

## 2020-09-30 DIAGNOSIS — K30 Functional dyspepsia: Secondary | ICD-10-CM | POA: Diagnosis not present

## 2020-09-30 DIAGNOSIS — G4700 Insomnia, unspecified: Secondary | ICD-10-CM | POA: Diagnosis not present

## 2020-09-30 DIAGNOSIS — G2581 Restless legs syndrome: Secondary | ICD-10-CM | POA: Diagnosis not present

## 2020-09-30 DIAGNOSIS — H81391 Other peripheral vertigo, right ear: Secondary | ICD-10-CM | POA: Diagnosis not present

## 2020-09-30 DIAGNOSIS — E785 Hyperlipidemia, unspecified: Secondary | ICD-10-CM | POA: Diagnosis not present

## 2020-09-30 DIAGNOSIS — N390 Urinary tract infection, site not specified: Secondary | ICD-10-CM | POA: Diagnosis not present

## 2020-10-13 DIAGNOSIS — H81399 Other peripheral vertigo, unspecified ear: Secondary | ICD-10-CM | POA: Insufficient documentation

## 2020-10-13 DIAGNOSIS — H81391 Other peripheral vertigo, right ear: Secondary | ICD-10-CM | POA: Diagnosis not present

## 2020-10-13 DIAGNOSIS — G4700 Insomnia, unspecified: Secondary | ICD-10-CM | POA: Diagnosis not present

## 2020-10-13 DIAGNOSIS — N183 Chronic kidney disease, stage 3 unspecified: Secondary | ICD-10-CM | POA: Diagnosis not present

## 2020-10-13 DIAGNOSIS — R319 Hematuria, unspecified: Secondary | ICD-10-CM | POA: Diagnosis not present

## 2020-10-13 DIAGNOSIS — N390 Urinary tract infection, site not specified: Secondary | ICD-10-CM | POA: Diagnosis not present

## 2020-10-13 DIAGNOSIS — I10 Essential (primary) hypertension: Secondary | ICD-10-CM | POA: Diagnosis not present

## 2020-10-13 DIAGNOSIS — K30 Functional dyspepsia: Secondary | ICD-10-CM | POA: Diagnosis not present

## 2020-10-13 DIAGNOSIS — J301 Allergic rhinitis due to pollen: Secondary | ICD-10-CM | POA: Diagnosis not present

## 2020-10-13 DIAGNOSIS — E785 Hyperlipidemia, unspecified: Secondary | ICD-10-CM | POA: Diagnosis not present

## 2020-10-16 ENCOUNTER — Emergency Department: Payer: Medicare Other

## 2020-10-16 ENCOUNTER — Emergency Department
Admission: EM | Admit: 2020-10-16 | Discharge: 2020-10-16 | Discharge status: Against Medical Advice | Payer: Medicare Other | Attending: Emergency Medicine | Admitting: Emergency Medicine

## 2020-10-16 ENCOUNTER — Encounter: Payer: Self-pay | Admitting: Emergency Medicine

## 2020-10-16 ENCOUNTER — Other Ambulatory Visit: Payer: Self-pay

## 2020-10-16 DIAGNOSIS — R109 Unspecified abdominal pain: Secondary | ICD-10-CM | POA: Diagnosis not present

## 2020-10-16 DIAGNOSIS — Z87891 Personal history of nicotine dependence: Secondary | ICD-10-CM | POA: Diagnosis not present

## 2020-10-16 DIAGNOSIS — I129 Hypertensive chronic kidney disease with stage 1 through stage 4 chronic kidney disease, or unspecified chronic kidney disease: Secondary | ICD-10-CM | POA: Diagnosis not present

## 2020-10-16 DIAGNOSIS — Z7902 Long term (current) use of antithrombotics/antiplatelets: Secondary | ICD-10-CM | POA: Insufficient documentation

## 2020-10-16 DIAGNOSIS — Z85850 Personal history of malignant neoplasm of thyroid: Secondary | ICD-10-CM | POA: Insufficient documentation

## 2020-10-16 DIAGNOSIS — I959 Hypotension, unspecified: Secondary | ICD-10-CM | POA: Insufficient documentation

## 2020-10-16 DIAGNOSIS — Z79899 Other long term (current) drug therapy: Secondary | ICD-10-CM | POA: Diagnosis not present

## 2020-10-16 DIAGNOSIS — J9811 Atelectasis: Secondary | ICD-10-CM | POA: Diagnosis not present

## 2020-10-16 DIAGNOSIS — N183 Chronic kidney disease, stage 3 unspecified: Secondary | ICD-10-CM | POA: Diagnosis not present

## 2020-10-16 DIAGNOSIS — R509 Fever, unspecified: Secondary | ICD-10-CM | POA: Diagnosis present

## 2020-10-16 DIAGNOSIS — Z20822 Contact with and (suspected) exposure to covid-19: Secondary | ICD-10-CM | POA: Insufficient documentation

## 2020-10-16 DIAGNOSIS — M47816 Spondylosis without myelopathy or radiculopathy, lumbar region: Secondary | ICD-10-CM | POA: Diagnosis not present

## 2020-10-16 DIAGNOSIS — R531 Weakness: Secondary | ICD-10-CM | POA: Diagnosis not present

## 2020-10-16 DIAGNOSIS — A419 Sepsis, unspecified organism: Secondary | ICD-10-CM

## 2020-10-16 DIAGNOSIS — I700 Atherosclerosis of aorta: Secondary | ICD-10-CM | POA: Diagnosis not present

## 2020-10-16 DIAGNOSIS — K449 Diaphragmatic hernia without obstruction or gangrene: Secondary | ICD-10-CM | POA: Diagnosis not present

## 2020-10-16 LAB — CBC WITH DIFFERENTIAL/PLATELET
Abs Immature Granulocytes: 0.03 10*3/uL (ref 0.00–0.07)
Basophils Absolute: 0 10*3/uL (ref 0.0–0.1)
Basophils Relative: 0 %
Eosinophils Absolute: 0.1 10*3/uL (ref 0.0–0.5)
Eosinophils Relative: 2 %
HCT: 31 % — ABNORMAL LOW (ref 36.0–46.0)
Hemoglobin: 10.5 g/dL — ABNORMAL LOW (ref 12.0–15.0)
Immature Granulocytes: 1 %
Lymphocytes Relative: 7 %
Lymphs Abs: 0.4 10*3/uL — ABNORMAL LOW (ref 0.7–4.0)
MCH: 33.2 pg (ref 26.0–34.0)
MCHC: 33.9 g/dL (ref 30.0–36.0)
MCV: 98.1 fL (ref 80.0–100.0)
Monocytes Absolute: 0.3 10*3/uL (ref 0.1–1.0)
Monocytes Relative: 6 %
Neutro Abs: 4.9 10*3/uL (ref 1.7–7.7)
Neutrophils Relative %: 84 %
Platelets: 162 10*3/uL (ref 150–400)
RBC: 3.16 MIL/uL — ABNORMAL LOW (ref 3.87–5.11)
RDW: 12.9 % (ref 11.5–15.5)
WBC: 5.8 10*3/uL (ref 4.0–10.5)
nRBC: 0 % (ref 0.0–0.2)

## 2020-10-16 LAB — COMPREHENSIVE METABOLIC PANEL
ALT: 23 U/L (ref 0–44)
AST: 44 U/L — ABNORMAL HIGH (ref 15–41)
Albumin: 3.1 g/dL — ABNORMAL LOW (ref 3.5–5.0)
Alkaline Phosphatase: 82 U/L (ref 38–126)
Anion gap: 7 (ref 5–15)
BUN: 38 mg/dL — ABNORMAL HIGH (ref 8–23)
CO2: 23 mmol/L (ref 22–32)
Calcium: 8.6 mg/dL — ABNORMAL LOW (ref 8.9–10.3)
Chloride: 99 mmol/L (ref 98–111)
Creatinine, Ser: 2.06 mg/dL — ABNORMAL HIGH (ref 0.44–1.00)
GFR, Estimated: 23 mL/min — ABNORMAL LOW (ref >60)
Glucose, Bld: 122 mg/dL — ABNORMAL HIGH (ref 70–99)
Potassium: 4.7 mmol/L (ref 3.5–5.1)
Sodium: 129 mmol/L — ABNORMAL LOW (ref 135–145)
Total Bilirubin: 0.9 mg/dL (ref 0.3–1.2)
Total Protein: 8.8 g/dL — ABNORMAL HIGH (ref 6.5–8.1)

## 2020-10-16 LAB — PROTIME-INR
INR: 1 (ref 0.8–1.2)
Prothrombin Time: 13.5 seconds (ref 11.4–15.2)

## 2020-10-16 LAB — URINALYSIS, COMPLETE (UACMP) WITH MICROSCOPIC
Bacteria, UA: NONE SEEN
Bilirubin Urine: NEGATIVE
Glucose, UA: NEGATIVE mg/dL
Hgb urine dipstick: NEGATIVE
Ketones, ur: NEGATIVE mg/dL
Leukocytes,Ua: NEGATIVE
Nitrite: NEGATIVE
Protein, ur: NEGATIVE mg/dL
Specific Gravity, Urine: 1.011 (ref 1.005–1.030)
Squamous Epithelial / HPF: NONE SEEN (ref 0–5)
pH: 6 (ref 5.0–8.0)

## 2020-10-16 LAB — RESP PANEL BY RT-PCR (FLU A&B, COVID) ARPGX2
Influenza A by PCR: NEGATIVE
Influenza B by PCR: NEGATIVE
SARS Coronavirus 2 by RT PCR: NEGATIVE

## 2020-10-16 LAB — LACTIC ACID, PLASMA
Lactic Acid, Venous: 2.1 mmol/L (ref 0.5–1.9)
Lactic Acid, Venous: 1.7 mmol/L (ref 0.5–1.9)

## 2020-10-16 LAB — APTT: aPTT: 29 seconds (ref 24–36)

## 2020-10-16 LAB — LIPASE, BLOOD: Lipase: 58 U/L — ABNORMAL HIGH (ref 11–51)

## 2020-10-16 LAB — TROPONIN I (HIGH SENSITIVITY)
Troponin I (High Sensitivity): 11 ng/L (ref <18)
Troponin I (High Sensitivity): 10 ng/L (ref <18)

## 2020-10-16 MED ORDER — LACTATED RINGERS IV BOLUS (SEPSIS)
250.0000 mL | Freq: Once | INTRAVENOUS | Status: AC
  Administered 2020-10-16: 250 mL via INTRAVENOUS

## 2020-10-16 MED ORDER — VANCOMYCIN HCL IN DEXTROSE 1-5 GM/200ML-% IV SOLN
1000.0000 mg | Freq: Once | INTRAVENOUS | Status: AC
  Administered 2020-10-16: 1000 mg via INTRAVENOUS
  Filled 2020-10-16: qty 200

## 2020-10-16 MED ORDER — SODIUM CHLORIDE 0.9 % IV SOLN
2.0000 g | Freq: Once | INTRAVENOUS | Status: AC
  Administered 2020-10-16: 2 g via INTRAVENOUS
  Filled 2020-10-16: qty 2

## 2020-10-16 MED ORDER — METRONIDAZOLE 500 MG/100ML IV SOLN
500.0000 mg | Freq: Once | INTRAVENOUS | Status: AC
Start: 2020-10-16 — End: 2020-10-16
  Administered 2020-10-16: 500 mg via INTRAVENOUS
  Filled 2020-10-16: qty 100

## 2020-10-16 MED ORDER — LACTATED RINGERS IV BOLUS (SEPSIS)
1000.0000 mL | Freq: Once | INTRAVENOUS | Status: AC
  Administered 2020-10-16: 1000 mL via INTRAVENOUS

## 2020-10-16 MED ORDER — SODIUM CHLORIDE 0.9 % IV BOLUS
1000.0000 mL | Freq: Once | INTRAVENOUS | Status: AC
  Administered 2020-10-16: 1000 mL via INTRAVENOUS

## 2020-10-16 MED ORDER — LEVOFLOXACIN 750 MG PO TABS: 750.0000 mg | ORAL_TABLET | ORAL | 0 refills | Status: AC

## 2020-10-16 MED ORDER — LACTATED RINGERS IV SOLN: INTRAVENOUS | Status: DC

## 2020-10-16 NOTE — Discharge Instructions (Addendum)
I really wish she would stay.  It would be very much safer if you did.  The lab work looks like you are having an overwhelming infection.  This would go along with a very low blood pressure as well.  Since you insist on going home please make sure you are drinking a lot of water for the next day or 2.  If you feel sicker at home and start vomiting again or run a fever or have any other problems please call the ambulance and return.  The best antibiotic I have to give you his Levaquin 750 mg.  You take 1 every 48 hours.  We will give you a dose here.  We will have you take it for total of 10 days for 5 pills.  Occasionally Levaquin will have side effects and can cause tendons to rupture.  This is usually preceded by pain in the arms or legs if this happens stop the Levaquin and come right back.  Occasionally it will make you confused.  If this happens it could be the Levaquin or could be the infection getting worse either way you need to come right back.  Your family needs to keep a close eye on you.  The side effects are very rare but they do happen.  You should also return if you develop diarrhea.

## 2020-10-16 NOTE — Progress Notes (Signed)
PHARMACY -  BRIEF ANTIBIOTIC NOTE   Pharmacy has received consult(s) for vancomycin and aztreonam from an ED provider.  The patient's profile has been reviewed for ht/wt/allergies/indication/available labs.    One time order(s) placed for vancomycin 1 g + aztreonam 2 g  Further antibiotics/pharmacy consults should be ordered by admitting physician if indicated.                       Thank you,  Tawnya Crook, PharmD 10/16/2020  2:03 PM

## 2020-10-16 NOTE — Sepsis Progress Note (Signed)
Code sepsis protocol being monitored by eLink. 

## 2020-10-16 NOTE — ED Provider Notes (Signed)
[facility] Emergency Department Provider Note  Time seen: 2:00 PM  I have reviewed the triage vital signs and the nursing notes.   HISTORY  Chief Complaint Emesis, Fever, and Weakness   HPI Julie Khan is a 86 y.o. female with a past medical history of arthritis, CKD, gastric reflux, hypertension, prior CVA, bladder cancer many years ago, who presents to the emergency department for weakness fever nausea and vomiting.  Patient states last week she was diagnosed with urinary tract infection, has been taking antibiotics but feels like her symptoms have been getting worse along with worsening weakness now she is nauseated with frequent episodes of vomiting unable to keep down her antibiotics or any fluids.  Patient states she has been having subjective fever at home.  Upon arrival patient with initial blood pressure of 63/48 was brought back to a room in the emergency department immediately for my evaluation.  Here the patient states she feels profoundly weak but denies any pain.  Denies any dysuria.  States nausea vomiting.  Past Medical History:  Diagnosis Date   Arthritis    Bladder cancer ([place])    Bladder cancer ([place])    Cancer ([place])    multiple myeloma   Chronic kidney disease    Chronic kidney disease    Dizziness    GERD (gastroesophageal reflux disease)    Hypertension    Multiple myeloma ([place])    Stroke ([place])    tia x 2   Tubular adenoma of colon     Patient Active Problem List   Diagnosis Date Noted   Epigastric pain 05/24/2019   Elevated troponin 05/24/2019   Benign essential hypertension 03/06/2019   Stage 3 chronic kidney disease ([place]) 03/06/2019   Leg cramping 11/04/2017   Tingling 11/04/2017   IDA (iron deficiency anemia) 10/05/2017   Hypertension 01/11/2017   Dizziness 01/11/2017   Carotid stenosis 01/11/2017   Bladder cancer ([place]) 06/29/2016   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[place]) 06/29/2016   Chondrocalcinosis of knee 12/19/2014    Osteoarthritis of knee 12/19/2014    Past Surgical History:  Procedure Laterality Date   bladder tumor removed     BREAST EXCISIONAL BIOPSY Bilateral 1970   Benign   BREAST SURGERY     bx   CATARACT EXTRACTION W/PHACO Left 12/16/2016   Procedure: CATARACT EXTRACTION PHACO AND INTRAOCULAR LENS PLACEMENT ([place]);  Surgeon: Eulogio Bear, MD;  Location: [facility] ORS;  Service: Ophthalmology;  Laterality: Left;  [place] 00:38.0 AP% 14.6 CDE 5.54 Fluid pack lot # [id number]   CATARACT EXTRACTION W/PHACO Right 03/03/2017   Procedure: CATARACT EXTRACTION PHACO AND INTRAOCULAR LENS PLACEMENT (IOC);  Surgeon: Eulogio Bear, MD;  Location: [facility] ORS;  Service: Ophthalmology;  Laterality: Right;  Lot # [id number] [place]: 00:29.8 AP%: 10.1 CDE: 3.01   COLONOSCOPY WITH PROPOFOL N/A 07/25/2015   Procedure: COLONOSCOPY WITH PROPOFOL;  Surgeon: Josefine Class, MD;  Location: [facility] ENDOSCOPY;  Service: Endoscopy;  Laterality: N/A;   COLONOSCOPY WITH PROPOFOL N/A 06/29/2017   Procedure: COLONOSCOPY WITH PROPOFOL;  Surgeon: Toledo, Benay Pike, MD;  Location: [facility] ENDOSCOPY;  Service: Gastroenterology;  Laterality: N/A;   CORONARY ANGIOGRAPHY N/A 05/24/2019   Procedure: CORONARY ANGIOGRAPHY;  Surgeon: Dionisio David, MD;  Location: [place] CV LAB;  Service: Cardiovascular;  Laterality: N/A;   ESOPHAGOGASTRODUODENOSCOPY (EGD) WITH PROPOFOL N/A 07/25/2015   Procedure: ESOPHAGOGASTRODUODENOSCOPY (EGD) WITH PROPOFOL;  Surgeon: Josefine Class, MD;  Location: [facility] ENDOSCOPY;  Service: Endoscopy;  Laterality: N/A;   ESOPHAGOGASTRODUODENOSCOPY (EGD) WITH  PROPOFOL N/A 06/29/2017   Procedure: ESOPHAGOGASTRODUODENOSCOPY (EGD) WITH PROPOFOL;  Surgeon: Toledo, Benay Pike, MD;  Location: [facility] ENDOSCOPY;  Service: Gastroenterology;  Laterality: N/A;   HERNIA REPAIR     LEFT HEART CATH N/A 05/24/2019   Procedure: Left Heart Cath;  Surgeon: Dionisio David, MD;  Location: [place] CV LAB;  Service: Cardiovascular;   Laterality: N/A;    Prior to Admission medications   Medication Sig Start Date End Date Taking? Authorizing Provider  clopidogrel (PLAVIX) 75 MG tablet Take 75 mg by mouth daily. Patient not taking: No sig reported    [provider]  losartan (COZAAR) 25 MG tablet Take 25 mg by mouth daily.    [provider]  meclizine (ANTIVERT) 25 MG tablet Take 25 mg by mouth 3 (three) times daily as needed.  10/26/17   [provider]  metoprolol succinate (TOPROL-XL) 25 MG 24 hr tablet Take 50 mg by mouth daily.     [provider]  nitroGLYCERIN (NITROSTAT) 0.3 MG SL tablet Place 1 tablet under the tongue as needed. 05/07/19   [provider]  PANTOPRAZOLE SODIUM PO Take 40 mg by mouth in the morning and at bedtime.    [provider]  ranolazine (RANEXA) 500 MG 12 hr tablet Take 500 mg by mouth 2 (two) times daily.    [provider]  rOPINIRole (REQUIP) 0.25 MG tablet Take 1 tablet by mouth as needed. 07/20/19   [provider]  rosuvastatin (CRESTOR) 10 MG tablet Take 10 mg by mouth daily. 05/15/19   [provider]    Allergies  Allergen Reactions   Iodine     Seizure  Betadine ok Other reaction(s): Other (See Comments) Sneezing Seizure  Betadine ok   Sucralfate Rash   Penicillins Other (See Comments) and Rash    Has patient had a PCN reaction causing immediate rash, facial/tongue/throat swelling, SOB or lightheadedness with hypotension: Unknown Has patient had a PCN reaction causing severe rash involving mucus membranes or skin necrosis: Unknown Has patient had a PCN reaction that required hospitalization: Unknown Has patient had a PCN reaction occurring within the last 10 years: Unknown If all of the above answers are "NO", then may proceed with Cephalosporin use.  Other reaction(s): Other (See Comments) Has patient had a PCN reaction causing immediate rash, facial/tongue/throat swelling, SOB or  lightheadedness with hypotension: Unknown Has patient had a PCN reaction causing severe rash involving mucus membranes or skin necrosis: Unknown Has patient had a PCN reaction that required hospitalization: Unknown Has patient had a PCN reaction occurring within the last 10 years: Unknown If all of the above answers are "NO", then may proceed with Cephalosporin use. Has patient had a PCN reaction causing immediate rash, facial/tongue/throat swelling, SOB or lightheadedness with hypotension: Unknown Has patient had a PCN reaction causing severe rash involving mucus membranes or skin necrosis: Unknown Has patient had a PCN reaction that required hospitalization: Unknown Has patient had a PCN reaction occurring within the last 10 years: Unknown If all of the above answers are "NO", then may proceed with Cephalosporin use.    Family History  Problem Relation Age of Onset   Cancer Mother    Colon cancer Mother    Colon polyps Mother    Cancer Sister    Breast cancer Sister 54   Cancer Brother    Bladder Cancer Neg Hx    Kidney cancer Neg Hx     Social History Social History   Tobacco  Use   Smoking status: Former   Smokeless tobacco: Never  Vaping Use   Vaping Use: Never used  Substance Use Topics   Alcohol use: No   Drug use: No    Review of Systems Constitutional: Subjective fevers at home.  Positive for generalized weakness and fatigue. Cardiovascular: Negative for chest pain. Respiratory: Negative for shortness of breath.  Negative for cough. Gastrointestinal: Negative for abdominal pain.  Positive for nausea vomiting. Genitourinary: Negative for urinary compaints, but does state frequent urinary tract infections. Musculoskeletal: Negative for musculoskeletal complaints Neurological: Negative for headache All other ROS negative  ____________________________________________   PHYSICAL EXAM:  VITAL SIGNS: ED Triage Vitals  Enc Vitals Group     BP 10/16/20 1344 (!)  63/48     Pulse Rate 10/16/20 1344 81     Resp 10/16/20 1344 16     Temp 10/16/20 1344 98.5 F (36.9 C)     Temp Source 10/16/20 1344 Oral     SpO2 10/16/20 1344 96 %     Weight 10/16/20 1342 156 lb 15.5 oz (71.2 kg)     Height 10/16/20 1342 5' (1.524 m)     Head Circumference --      Peak Flow --      Pain Score 10/16/20 1341 0     Pain Loc --      Pain Edu? --      Excl. in [place]? --    Constitutional: Patient is fatigued appearing but does answer all questions appropriately able to give a good history and follows commands. Eyes: Normal exam ENT      Head: Normocephalic and atraumatic.      Mouth/Throat: Mucous membranes are moist. Cardiovascular: Normal rate, regular rhythm.  Respiratory: Normal respiratory effort without tachypnea nor retractions. Breath sounds are clear Gastrointestinal: Soft, mild to moderate suprapubic to left lower quadrant tenderness to palpation.  No rebound guarding or distention.  Abdomen otherwise benign. Musculoskeletal: Nontender with normal range of motion in all extremities. Neurologic:  Normal speech and language. No gross focal neurologic deficits  Skin:  Skin is warm, dry and intact.  Psychiatric: Mood and affect are normal.  ____________________________________________    EKG  EKG viewed and interpreted by myself shows a normal sinus rhythm at 77 bpm with a narrow QRS, normal axis, normal intervals, no concerning ST changes.  ____________________________________________    RADIOLOGY  CT scan is negative. Chest x-ray negative.  ____________________________________________   INITIAL IMPRESSION / ASSESSMENT AND PLAN / ED COURSE  Pertinent labs & imaging results that were available during my care of the patient were reviewed by me and considered in my medical decision making (see chart for details).   Patient presents to the emergency department for subjective fever, nausea vomiting generalized weakness.  Patient had been diagnosed  with UTI last week currently on antibiotics has not been able to keep any down recently due to nausea and vomiting.  Given the patient's reported fever at home, tachypnea with hypotension we will activate our sepsis protocols including labs, cultures, 30 mL/kg fluids for resuscitation start broad-spectrum antibiotics while awaiting further results.  Given the patient's moderate left lower quadrant abdominal tenderness we will also obtain CT imaging of the abdomen.  Patient agreeable to plan of care.  With IV fluids infusing patient's blood pressure is improving quickly currently 122/72 with only a few 100 cc infused so far.  Imaging is negative.  Blood pressure remains improved.  Lactic acid is elevated at 2.1.  COVID/flu  test along with urinalysis is pending.  Patient receiving IV antibiotics.  Once the remainder of the emergency department work-up is been completed anticipate admission to the hospital service for further treatment and work-up.  Albirda Shiel was evaluated in Emergency Department on 10/16/2020 for the symptoms described in the history of present illness. She was evaluated in the context of the global COVID-19 pandemic, which necessitated consideration that the patient might be at risk for infection with the SARS-CoV-2 virus that causes COVID-19. Institutional protocols and algorithms that pertain to the evaluation of patients at risk for COVID-19 are in a state of rapid change based on information released by regulatory bodies including the CDC and federal and state organizations. These policies and algorithms were followed during the patient's care in the ED.  CRITICAL CARE Performed by: Harvest Dark   Total critical care time: 30 minutes  Critical care time was exclusive of separately billable procedures and treating other patients.  Critical care was necessary to treat or prevent imminent or life-threatening deterioration.  Critical care was time spent personally by me on  the following activities: development of treatment plan with patient and/or surrogate as well as nursing, discussions with consultants, evaluation of patient's response to treatment, examination of patient, obtaining history from patient or surrogate, ordering and performing treatments and interventions, ordering and review of laboratory studies, ordering and review of radiographic studies, pulse oximetry and re-evaluation of patient's condition.  ____________________________________________   FINAL CLINICAL IMPRESSION(S) / ED DIAGNOSES  Hypotension Weakness Sepsis   Harvest Dark, MD 10/16/20 1520

## 2020-10-16 NOTE — Sepsis Progress Note (Signed)
Notified bedside nurse of need to draw lactic acid and blood cultures.  

## 2020-10-16 NOTE — ED Provider Notes (Signed)
-----------------------------------------   6:59 PM on 10/16/2020 ----------------------------------------- Patient's labs have returned.  She has some white cells in her urine and also the chest x-ray shows some mild bibasilar atelectasis which may actually be a small infiltrate.  Patient is not coughing she feels much better now and insist on going home.  I explained to her that she has labs that are consistent with sepsis and a very low blood pressure makes it very unsafe for her to go home and that she could get much worse and possibly even die.  Patient says she feels well and is gone to go home.  Her daughter is in the room with her.  She sees and hears all the information.  They do promised to come back if she is worse.  Patient lives by herself and want to call ambulance.  I found out from [facility] that on July 11 she was started on Macrobid twice daily.  I do not think this will be strong enough.  In spite of the risks I will give her Levaquin 750.  This is the strongest and best absorbed antibiotic that I have that will cover urine and also the possibility of anything in the lung.  It is also good for sepsis which she has.  I have adjusted the dose to 750 every 48 hours to account for her decreased renal function.  I have told her repeatedly I am very worried about letting her go but she insist on leaving.   Nena Polio, MD 10/16/20 1911

## 2020-10-16 NOTE — Progress Notes (Signed)
CODE SEPSIS - PHARMACY COMMUNICATION  **Broad Spectrum Antibiotics should be administered within 1 hour of Sepsis diagnosis**  Time Code Sepsis Called/Page Received: 1402  Antibiotics Ordered: aztreonam/metronidazole/vancomycin  Time of 1st antibiotic administration: 1450 (metronidazole)    Tawnya Crook ,PharmD Clinical Pharmacist  10/16/2020  2:53 PM

## 2020-10-16 NOTE — Sepsis Progress Note (Signed)
Code Sepsis discontinued, patient discharged, left AMA.   Jasmeet Gehl DNP elink RN 1910 PM

## 2020-10-16 NOTE — ED Triage Notes (Signed)
Pt comes into the ED via POV c/o weakness, fever, and emesis.  Pt states she was diagnose with a UTI last week and progressively has been getting worse.  PT states she is vomiting up all fluids she is trying to keep down.  Pt currently has even and unlabored respirations at this time.

## 2020-10-16 NOTE — ED Notes (Signed)
Per 2 previous RNs, pt wanting to leave AMA. Pt not found in room at this time, pt dc.

## 2020-10-16 NOTE — ED Provider Notes (Addendum)
HPI: Pt is a 86 y.o. female who presents with complaints of weakness   The patient p/w  weakness after rx for uti. Now with back pain bilaterally. H/o bladder cancer. No chest pain/sob   ROS: Denies fever, chest pain, vomiting  Past Medical History:  Diagnosis Date   Arthritis    Bladder cancer ([place])    Bladder cancer ([place])    Cancer ([place])    multiple myeloma   Chronic kidney disease    Chronic kidney disease    Dizziness    GERD (gastroesophageal reflux disease)    Hypertension    Multiple myeloma ([place])    Stroke ([place])    tia x 2   Tubular adenoma of colon    There were no vitals filed for this visit.  Focused Physical Exam: Gen: No acute distress Head: atraumatic, normocephalic Eyes: Extraocular movements grossly intact; conjunctiva clear CV: RRR Lung: No increased WOB, no stridor GI: ND, no obvious masses Neuro: Alert and awake Back: pain bilaterally.   Medical Decision Making and Plan: Given the patient's initial medical screening exam, the following diagnostic evaluation has been ordered. The patient will be placed in the appropriate treatment space, once one is available, to complete the evaluation and treatment. I have discussed the plan of care with the patient and I have advised the patient that an ED physician or mid-level practitioner will reevaluate their condition after the test results have been received, as the results may give them additional insight into the type of treatment they may need.   Diagnostics: labs, CT   Hypotensive--- getting next bed and fluids.   Treatments: none immediately   Vanessa Kendall, MD 10/16/20 1344    Vanessa [place], MD 10/16/20 1346

## 2020-10-16 NOTE — ED Notes (Signed)
Critical Lactic acid 2.1. Reported by lab tech [place].

## 2020-10-16 NOTE — ED Notes (Signed)
Spoke with pharmacy Manuela Schwartz about Azactam and whether it was to be given and it has been confirmed that it is to run in its entirety.

## 2020-10-18 LAB — URINE CULTURE: Culture: NO GROWTH

## 2020-10-21 ENCOUNTER — Other Ambulatory Visit: Payer: Self-pay

## 2020-10-21 ENCOUNTER — Inpatient Hospital Stay: Payer: Medicare Other | Attending: Oncology

## 2020-10-21 DIAGNOSIS — N183 Chronic kidney disease, stage 3 unspecified: Secondary | ICD-10-CM | POA: Diagnosis not present

## 2020-10-21 DIAGNOSIS — D649 Anemia, unspecified: Secondary | ICD-10-CM | POA: Insufficient documentation

## 2020-10-21 DIAGNOSIS — N189 Chronic kidney disease, unspecified: Secondary | ICD-10-CM | POA: Insufficient documentation

## 2020-10-21 DIAGNOSIS — K219 Gastro-esophageal reflux disease without esophagitis: Secondary | ICD-10-CM | POA: Insufficient documentation

## 2020-10-21 DIAGNOSIS — R531 Weakness: Secondary | ICD-10-CM | POA: Insufficient documentation

## 2020-10-21 DIAGNOSIS — Z86010 Personal history of colonic polyps: Secondary | ICD-10-CM | POA: Insufficient documentation

## 2020-10-21 DIAGNOSIS — Z8673 Personal history of transient ischemic attack (TIA), and cerebral infarction without residual deficits: Secondary | ICD-10-CM | POA: Insufficient documentation

## 2020-10-21 DIAGNOSIS — R5383 Other fatigue: Secondary | ICD-10-CM | POA: Diagnosis not present

## 2020-10-21 DIAGNOSIS — G2581 Restless legs syndrome: Secondary | ICD-10-CM | POA: Diagnosis not present

## 2020-10-21 DIAGNOSIS — H81391 Other peripheral vertigo, right ear: Secondary | ICD-10-CM | POA: Diagnosis not present

## 2020-10-21 DIAGNOSIS — C9000 Multiple myeloma not having achieved remission: Secondary | ICD-10-CM | POA: Diagnosis not present

## 2020-10-21 DIAGNOSIS — E785 Hyperlipidemia, unspecified: Secondary | ICD-10-CM | POA: Diagnosis not present

## 2020-10-21 DIAGNOSIS — Z8551 Personal history of malignant neoplasm of bladder: Secondary | ICD-10-CM | POA: Diagnosis not present

## 2020-10-21 DIAGNOSIS — I129 Hypertensive chronic kidney disease with stage 1 through stage 4 chronic kidney disease, or unspecified chronic kidney disease: Secondary | ICD-10-CM | POA: Insufficient documentation

## 2020-10-21 DIAGNOSIS — I700 Atherosclerosis of aorta: Secondary | ICD-10-CM | POA: Diagnosis not present

## 2020-10-21 DIAGNOSIS — Z79899 Other long term (current) drug therapy: Secondary | ICD-10-CM | POA: Insufficient documentation

## 2020-10-21 DIAGNOSIS — J301 Allergic rhinitis due to pollen: Secondary | ICD-10-CM | POA: Diagnosis not present

## 2020-10-21 DIAGNOSIS — K449 Diaphragmatic hernia without obstruction or gangrene: Secondary | ICD-10-CM | POA: Insufficient documentation

## 2020-10-21 DIAGNOSIS — F5102 Adjustment insomnia: Secondary | ICD-10-CM | POA: Insufficient documentation

## 2020-10-21 DIAGNOSIS — D472 Monoclonal gammopathy: Secondary | ICD-10-CM

## 2020-10-21 DIAGNOSIS — N390 Urinary tract infection, site not specified: Secondary | ICD-10-CM | POA: Diagnosis not present

## 2020-10-21 DIAGNOSIS — M47816 Spondylosis without myelopathy or radiculopathy, lumbar region: Secondary | ICD-10-CM | POA: Diagnosis not present

## 2020-10-21 DIAGNOSIS — K30 Functional dyspepsia: Secondary | ICD-10-CM | POA: Diagnosis not present

## 2020-10-21 LAB — CBC WITH DIFFERENTIAL/PLATELET
Abs Immature Granulocytes: 0.03 10*3/uL (ref 0.00–0.07)
Basophils Absolute: 0 10*3/uL (ref 0.0–0.1)
Basophils Relative: 0 %
Eosinophils Absolute: 0.4 10*3/uL (ref 0.0–0.5)
Eosinophils Relative: 6 %
HCT: 33.3 % — ABNORMAL LOW (ref 36.0–46.0)
Hemoglobin: 11 g/dL — ABNORMAL LOW (ref 12.0–15.0)
Immature Granulocytes: 0 %
Lymphocytes Relative: 35 %
Lymphs Abs: 2.4 10*3/uL (ref 0.7–4.0)
MCH: 32.2 pg (ref 26.0–34.0)
MCHC: 33 g/dL (ref 30.0–36.0)
MCV: 97.4 fL (ref 80.0–100.0)
Monocytes Absolute: 0.8 10*3/uL (ref 0.1–1.0)
Monocytes Relative: 11 %
Neutro Abs: 3.2 10*3/uL (ref 1.7–7.7)
Neutrophils Relative %: 48 %
Platelets: 180 10*3/uL (ref 150–400)
RBC: 3.42 MIL/uL — ABNORMAL LOW (ref 3.87–5.11)
RDW: 13 % (ref 11.5–15.5)
WBC: 6.7 10*3/uL (ref 4.0–10.5)
nRBC: 0 % (ref 0.0–0.2)

## 2020-10-21 LAB — COMPREHENSIVE METABOLIC PANEL
ALT: 21 U/L (ref 0–44)
AST: 37 U/L (ref 15–41)
Albumin: 3.4 g/dL — ABNORMAL LOW (ref 3.5–5.0)
Alkaline Phosphatase: 91 U/L (ref 38–126)
Anion gap: 9 (ref 5–15)
BUN: 23 mg/dL (ref 8–23)
CO2: 24 mmol/L (ref 22–32)
Calcium: 9.1 mg/dL (ref 8.9–10.3)
Chloride: 95 mmol/L — ABNORMAL LOW (ref 98–111)
Creatinine, Ser: 1.57 mg/dL — ABNORMAL HIGH (ref 0.44–1.00)
GFR, Estimated: 32 mL/min — ABNORMAL LOW (ref >60)
Glucose, Bld: 94 mg/dL (ref 70–99)
Potassium: 4.1 mmol/L (ref 3.5–5.1)
Sodium: 128 mmol/L — ABNORMAL LOW (ref 135–145)
Total Bilirubin: 0.6 mg/dL (ref 0.3–1.2)
Total Protein: 9.3 g/dL — ABNORMAL HIGH (ref 6.5–8.1)

## 2020-10-21 LAB — CULTURE, BLOOD (ROUTINE X 2)
Culture: NO GROWTH
Culture: NO GROWTH

## 2020-10-22 ENCOUNTER — Other Ambulatory Visit: Payer: Self-pay

## 2020-10-22 ENCOUNTER — Emergency Department
Admission: EM | Admit: 2020-10-22 | Discharge: 2020-10-22 | Disposition: A | Discharge status: Home / Self Care | Payer: Medicare Other | Attending: Emergency Medicine | Admitting: Emergency Medicine

## 2020-10-22 DIAGNOSIS — I129 Hypertensive chronic kidney disease with stage 1 through stage 4 chronic kidney disease, or unspecified chronic kidney disease: Secondary | ICD-10-CM | POA: Diagnosis not present

## 2020-10-22 DIAGNOSIS — Z87891 Personal history of nicotine dependence: Secondary | ICD-10-CM | POA: Insufficient documentation

## 2020-10-22 DIAGNOSIS — R197 Diarrhea, unspecified: Secondary | ICD-10-CM | POA: Diagnosis not present

## 2020-10-22 DIAGNOSIS — Z79899 Other long term (current) drug therapy: Secondary | ICD-10-CM | POA: Diagnosis not present

## 2020-10-22 DIAGNOSIS — Z8551 Personal history of malignant neoplasm of bladder: Secondary | ICD-10-CM | POA: Diagnosis not present

## 2020-10-22 DIAGNOSIS — R112 Nausea with vomiting, unspecified: Secondary | ICD-10-CM | POA: Insufficient documentation

## 2020-10-22 DIAGNOSIS — N183 Chronic kidney disease, stage 3 unspecified: Secondary | ICD-10-CM | POA: Insufficient documentation

## 2020-10-22 DIAGNOSIS — Z955 Presence of coronary angioplasty implant and graft: Secondary | ICD-10-CM | POA: Insufficient documentation

## 2020-10-22 LAB — COMPREHENSIVE METABOLIC PANEL
ALT: 18 U/L (ref 0–44)
AST: 34 U/L (ref 15–41)
Albumin: 3 g/dL — ABNORMAL LOW (ref 3.5–5.0)
Alkaline Phosphatase: 72 U/L (ref 38–126)
Anion gap: 9 (ref 5–15)
BUN: 29 mg/dL — ABNORMAL HIGH (ref 8–23)
CO2: 24 mmol/L (ref 22–32)
Calcium: 8.7 mg/dL — ABNORMAL LOW (ref 8.9–10.3)
Chloride: 96 mmol/L — ABNORMAL LOW (ref 98–111)
Creatinine, Ser: 1.86 mg/dL — ABNORMAL HIGH (ref 0.44–1.00)
GFR, Estimated: 26 mL/min — ABNORMAL LOW (ref >60)
Glucose, Bld: 113 mg/dL — ABNORMAL HIGH (ref 70–99)
Potassium: 4.5 mmol/L (ref 3.5–5.1)
Sodium: 129 mmol/L — ABNORMAL LOW (ref 135–145)
Total Bilirubin: 0.9 mg/dL (ref 0.3–1.2)
Total Protein: 8.4 g/dL — ABNORMAL HIGH (ref 6.5–8.1)

## 2020-10-22 LAB — CBC
HCT: 31.6 % — ABNORMAL LOW (ref 36.0–46.0)
Hemoglobin: 10.9 g/dL — ABNORMAL LOW (ref 12.0–15.0)
MCH: 33.6 pg (ref 26.0–34.0)
MCHC: 34.5 g/dL (ref 30.0–36.0)
MCV: 97.5 fL (ref 80.0–100.0)
Platelets: 180 10*3/uL (ref 150–400)
RBC: 3.24 MIL/uL — ABNORMAL LOW (ref 3.87–5.11)
RDW: 13 % (ref 11.5–15.5)
WBC: 9.1 10*3/uL (ref 4.0–10.5)
nRBC: 0 % (ref 0.0–0.2)

## 2020-10-22 LAB — URINALYSIS, COMPLETE (UACMP) WITH MICROSCOPIC
Bacteria, UA: NONE SEEN
Bilirubin Urine: NEGATIVE
Glucose, UA: NEGATIVE mg/dL
Hgb urine dipstick: NEGATIVE
Ketones, ur: NEGATIVE mg/dL
Nitrite: NEGATIVE
Protein, ur: NEGATIVE mg/dL
Specific Gravity, Urine: 1.008 (ref 1.005–1.030)
pH: 5 (ref 5.0–8.0)

## 2020-10-22 LAB — LIPASE, BLOOD: Lipase: 55 U/L — ABNORMAL HIGH (ref 11–51)

## 2020-10-22 LAB — IGG, IGA, IGM
IgA: 64 mg/dL (ref 64–422)
IgG (Immunoglobin G), Serum: 4348 mg/dL — ABNORMAL HIGH (ref 586–1602)
IgM (Immunoglobulin M), Srm: 87 mg/dL (ref 26–217)

## 2020-10-22 LAB — KAPPA/LAMBDA LIGHT CHAINS
Kappa free light chain: 184.3 mg/L — ABNORMAL HIGH (ref 3.3–19.4)
Kappa, lambda light chain ratio: 16.46 — ABNORMAL HIGH (ref 0.26–1.65)
Lambda free light chains: 11.2 mg/L (ref 5.7–26.3)

## 2020-10-22 MED ORDER — ONDANSETRON 4 MG PO TBDP: 4.0000 mg | ORAL_TABLET | Freq: Three times a day (TID) | ORAL | 0 refills | Status: AC | PRN

## 2020-10-22 MED ORDER — SODIUM CHLORIDE 0.9 % IV BOLUS
500.0000 mL | Freq: Once | INTRAVENOUS | Status: AC
  Administered 2020-10-22: 500 mL via INTRAVENOUS

## 2020-10-22 NOTE — Discharge Instructions (Addendum)
ScribedYour work-up was reassuring.  Your urine did show non-squamous epithelial cells which you need to discuss with your urologist about potentially whether or not you need another cystoscopy.   I [perscribed Some Zofran to help with nausea.  Return the ER if your symptoms are worsening or develop any other new concern

## 2020-10-22 NOTE — ED Notes (Signed)
Pt stated that she has not had diarrhea in a few days; is here because "one" of her antibiotics gave her what she suspects is a rash on her legs. When assessed no rash was found. Skin appropriate for ethnicity, dry, intact.

## 2020-10-22 NOTE — ED Provider Notes (Signed)
[facility] Emergency Department Provider Note  ____________________________________________   Event Date/Time   First MD Initiated Contact with Patient 10/22/20 1723     (approximate)  I have reviewed the triage vital signs and the nursing notes.   HISTORY  Chief Complaint Emesis    HPI Julie Khan is a 86 y.o. female with arthritis, CKD, gastric reflux, hypertension, prior CVA, bladder cancer who comes in concern for emesis.  Patient reports that she is still on the antibiotics for the UTI.  She states that she does not have any urinary symptoms but today she woke up and had 1 episode of emesis.  She no longer feels nauseous.  She also reports not really diarrhea but could have loose stool.  She reports some intermittent nausea and diarrhea since traveling out of the country a month ago.  She states that she was told to come back in because her urine culture from her PCP grew E. Coli and they wanted her to be evaluated to see if she needed additional antibiotics.  Patient was seen here on 7/14 for weakness, fevers, nausea and vomiting.  Patient at that time and reported that the prior week she was diagnosed with a UTI and she is taking antibiotics but that her symptoms were worsening.  Patient was initially hypotensive.  The plan was to admit patient for sepsis.  Patient was placed on Levaquin for her decreased renal function and left AGAINST MEDICAL ADVICE.         Past Medical History:  Diagnosis Date   Arthritis    Bladder cancer ([place])    Bladder cancer ([place])    Cancer ([place])    multiple myeloma   Chronic kidney disease    Chronic kidney disease    Dizziness    GERD (gastroesophageal reflux disease)    Hypertension    Multiple myeloma ([place])    Stroke ([place])    tia x 2   Tubular adenoma of colon     Patient Active Problem List   Diagnosis Date Noted   Epigastric pain 05/24/2019   Elevated troponin 05/24/2019   Benign essential  hypertension 03/06/2019   Stage 3 chronic kidney disease ([place]) 03/06/2019   Leg cramping 11/04/2017   Tingling 11/04/2017   IDA (iron deficiency anemia) 10/05/2017   Hypertension 01/11/2017   Dizziness 01/11/2017   Carotid stenosis 01/11/2017   Bladder cancer ([place]) 06/29/2016   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   Chondrocalcinosis of knee 12/19/2014   Osteoarthritis of knee 12/19/2014    Past Surgical History:  Procedure Laterality Date   bladder tumor removed     BREAST EXCISIONAL BIOPSY Bilateral 1970   Benign   BREAST SURGERY     bx   CATARACT EXTRACTION W/PHACO Left 12/16/2016   Procedure: CATARACT EXTRACTION PHACO AND INTRAOCULAR LENS PLACEMENT ([place]);  Surgeon: Eulogio Bear, MD;  Location: [facility] ORS;  Service: Ophthalmology;  Laterality: Left;  [place] 00:38.0 AP% 14.6 CDE 5.54 Fluid pack lot # [id number]   CATARACT EXTRACTION W/PHACO Right 03/03/2017   Procedure: CATARACT EXTRACTION PHACO AND INTRAOCULAR LENS PLACEMENT (IOC);  Surgeon: Eulogio Bear, MD;  Location: [facility] ORS;  Service: Ophthalmology;  Laterality: Right;  Lot # [id number] [place]: 00:29.8 AP%: 10.1 CDE: 3.01   COLONOSCOPY WITH PROPOFOL N/A 07/25/2015   Procedure: COLONOSCOPY WITH PROPOFOL;  Surgeon: Josefine Class, MD;  Location: [facility] ENDOSCOPY;  Service: Endoscopy;  Laterality: N/A;   COLONOSCOPY WITH PROPOFOL N/A 06/29/2017   Procedure: COLONOSCOPY WITH PROPOFOL;  Surgeon: Toledo, Benay Pike, MD;  Location: [facility] ENDOSCOPY;  Service: Gastroenterology;  Laterality: N/A;   CORONARY ANGIOGRAPHY N/A 05/24/2019   Procedure: CORONARY ANGIOGRAPHY;  Surgeon: Dionisio David, MD;  Location: [place] [facility] LAB;  Service: Cardiovascular;  Laterality: N/A;   ESOPHAGOGASTRODUODENOSCOPY (EGD) WITH PROPOFOL N/A 07/25/2015   Procedure: ESOPHAGOGASTRODUODENOSCOPY (EGD) WITH PROPOFOL;  Surgeon: Josefine Class, MD;  Location: [facility] ENDOSCOPY;  Service: Endoscopy;  Laterality: N/A;   ESOPHAGOGASTRODUODENOSCOPY (EGD) WITH  PROPOFOL N/A 06/29/2017   Procedure: ESOPHAGOGASTRODUODENOSCOPY (EGD) WITH PROPOFOL;  Surgeon: Toledo, Benay Pike, MD;  Location: [facility] ENDOSCOPY;  Service: Gastroenterology;  Laterality: N/A;   HERNIA REPAIR     LEFT HEART CATH N/A 05/24/2019   Procedure: Left Heart Cath;  Surgeon: Dionisio David, MD;  Location: [place] [facility];  Service: Cardiovascular;  Laterality: N/A;    Prior to Admission medications   Medication Sig Start Date End Date Taking? Authorizing Provider  levofloxacin (LEVAQUIN) 750 MG tablet Take 1 tablet (750 mg total) by mouth every other day for 5 doses. 10/16/20 10/25/20  Nena Polio, MD  losartan (COZAAR) 25 MG tablet Take 25 mg by mouth daily.    [provider]  meclizine (ANTIVERT) 25 MG tablet Take 25 mg by mouth 3 (three) times daily as needed.  10/26/17   [provider]  metoprolol succinate (TOPROL-XL) 25 MG 24 hr tablet Take 50 mg by mouth daily.     [provider]  Multiple Vitamins-Minerals (CENTRUM SILVER 50+WOMEN) TABS Take 1 tablet by mouth daily.    [provider]  nitrofurantoin, macrocrystal-monohydrate, (MACROBID) 100 MG capsule Take 100 mg by mouth 2 (two) times daily. 10/13/20   [provider]  nitroGLYCERIN (NITROSTAT) 0.3 MG SL tablet Place 1 tablet under the tongue as needed. 05/07/19   [provider]  Omega-3 Fatty Acids (FISH OIL) 1000 MG CAPS Take 1 capsule by mouth daily.    [provider]  pantoprazole (PROTONIX) 40 MG tablet Take 40 mg by mouth 2 (two) times daily. 06/18/20   [provider]  ranolazine (RANEXA) 500 MG 12 hr tablet Take 500 mg by mouth 2 (two) times daily.    [provider]  rOPINIRole (REQUIP) 0.25 MG tablet Take 1 tablet by mouth as needed. 07/20/19   [provider]  rosuvastatin (CRESTOR) 10 MG tablet Take 10 mg by mouth daily. 05/15/19   [provider]    Allergies Iodine, Sucralfate, and Penicillins  Family History   Problem Relation Age of Onset   Cancer Mother    Colon cancer Mother    Colon polyps Mother    Cancer Sister    Breast cancer Sister 63   Cancer Brother    Bladder Cancer Neg Hx    Kidney cancer Neg Hx     Social History Social History   Tobacco Use   Smoking status: Former   Smokeless tobacco: Never  Scientific laboratory technician Use: Never used  Substance Use Topics   Alcohol use: No   Drug use: No      Review of Systems Constitutional: No fever/chills Eyes: No visual changes. ENT: No sore throat. Cardiovascular: Denies chest pain. Respiratory: Denies shortness of breath. Gastrointestinal: No abdominal pain.  Positive nausea, diarrhea Genitourinary: Negative for dysuria. Musculoskeletal: Negative for back pain. Skin: Negative for rash. Neurological: Negative for headaches, focal weakness or numbness. All other ROS negative ____________________________________________   PHYSICAL EXAM:  VITAL SIGNS: ED Triage Vitals  Enc  Vitals Group     BP 10/22/20 1520 100/69     Pulse Rate 10/22/20 1520 88     Resp 10/22/20 1520 18     Temp 10/22/20 1520 98.7 F (37.1 C)     Temp Source 10/22/20 1520 Oral     SpO2 10/22/20 1520 97 %     Weight 10/22/20 1521 158 lb 11.7 oz (72 kg)     Height 10/22/20 1521 5' (1.524 m)     Head Circumference --      Peak Flow --      Pain Score 10/22/20 1521 0     Pain Loc --      Pain Edu? --      Excl. in [place]? --     Constitutional: Alert and oriented. Well appearing and in no acute distress. Eyes: Conjunctivae are normal. EOMI. Head: Atraumatic. Nose: No congestion/rhinnorhea. Mouth/Throat: Mucous membranes are moist.   Neck: No stridor. Trachea Midline. FROM Cardiovascular: Normal rate, regular rhythm. Grossly normal heart sounds.  Good peripheral circulation. Respiratory: Normal respiratory effort.  No retractions. Lungs CTAB. Gastrointestinal: Soft and nontender. No distention. No abdominal bruits.  Musculoskeletal: No lower  extremity tenderness nor edema.  No joint effusions. Neurologic:  Normal speech and language. No gross focal neurologic deficits are appreciated.  Skin:  Skin is warm, dry and intact. No rash noted. Psychiatric: Mood and affect are normal. Speech and behavior are normal. GU: Deferred   ____________________________________________   LABS (all labs ordered are listed, but only abnormal results are displayed)  Labs Reviewed  LIPASE, BLOOD - Abnormal; Notable for the following components:      Result Value   Lipase 55 (*)    All other components within normal limits  COMPREHENSIVE METABOLIC PANEL - Abnormal; Notable for the following components:   Sodium 129 (*)    Chloride 96 (*)    Glucose, Bld 113 (*)    BUN 29 (*)    Creatinine, Ser 1.86 (*)    Calcium 8.7 (*)    Total Protein 8.4 (*)    Albumin 3.0 (*)    GFR, Estimated 26 (*)    All other components within normal limits  CBC - Abnormal; Notable for the following components:   RBC 3.24 (*)    Hemoglobin 10.9 (*)    HCT 31.6 (*)    All other components within normal limits  URINALYSIS, COMPLETE (UACMP) WITH MICROSCOPIC   ____________________________________________    INITIAL IMPRESSION / ASSESSMENT AND PLAN / ED COURSE  Julie Khan was evaluated in Emergency Department on 10/22/2020 for the symptoms described in the history of present illness. She was evaluated in the context of the global COVID-19 pandemic, which necessitated consideration that the patient might be at risk for infection with the SARS-CoV-2 virus that causes COVID-19. Institutional protocols and algorithms that pertain to the evaluation of patients at risk for COVID-19 are in a state of rapid change based on information released by regulatory bodies including the CDC and federal and state organizations. These policies and algorithms were followed during the patient's care in the ED.    Patient is a well-appearing 86 year old comes in with 1 episode of  vomiting but denies any nausea at this time.  Declines any medications.  Also reports some loose stools but denies it is really diarrhea.  Discussed with patient that she is able to give a stool sample given her recent travel I want to send this to test to make sure there is  no parasites, other infections that could be causing her to not feel well.  Her abdomen at this time is soft and nontender and she did have recent CT imaging that was negative.  I discussed with patient that after reviewing her records that when she was here 6 days ago her urine culture was negative so I suspect that the urine culture that was positive was over 2 weeks ago when she was for started on the Macrobid and that the fact that it is already negative now means that the urine was probably already completely treated and her nausea and diarrhea could be related to the antibiotics or could be related to another kind of illness.  At this time do not feel that she needs IV antibiotics given she is afebrile, well-appearing, blood pressures are normal, white count is normal.  Patient expressed understanding and felt comfortable with that.  We will get with a little bit of fluid given her low sodium and recheck the urine to make sure that is not getting worse today.  Urine looks very similar to what it was 6 days ago and it did not grow anything.  Did send a repeat culture.  There are some non-squamous epithelial which I discussed with patient.  She is already following up with urology given her history of bladder cancer and she had a little bit of hematuria.  I recommended she might need a cystoscopy to make sure that there is no cancer.  Patient expressed understanding.  Will discharge with a few Zofran.  She is had no vomiting or diarrhea since being here     Clinical Course as of 10/22/20 2008  Wed Oct 22, 2020  2320 Non Squamous Epithelial(!): PRESENT [MF]    Clinical Course User Index [MF] Vanessa Rio Lucio, MD      ____________________________________________   FINAL CLINICAL IMPRESSION(S) / ED DIAGNOSES   Final diagnoses:  Nausea vomiting and diarrhea      MEDICATIONS GIVEN DURING THIS VISIT:  Medications  sodium chloride 0.9 % bolus 500 mL (500 mLs Intravenous Bolus 10/22/20 1827)     ED Discharge Orders          Ordered    ondansetron (ZOFRAN ODT) 4 MG disintegrating tablet  Every 8 hours PRN        10/22/20 2013             Note:  This document was prepared using Dragon voice recognition software and may include unintentional dictation errors.    Vanessa Emily, MD 10/22/20 2014

## 2020-10-22 NOTE — ED Triage Notes (Signed)
Pt comes pov with emesis. States she was seen here recently for same with e coli infection. States emesis and diarrhea had stopped but started again today. Has not finished course of antibiotics.

## 2020-10-22 NOTE — ED Provider Notes (Signed)
Emergency Medicine Provider Triage Evaluation Note  Julie Khan , a 86 y.o. female  was evaluated in triage.  Pt complains of emesis.  Patient states she was diagnosed with an E. coli infection last week.  Her physician told her to come to the ER as he thinks she is resistant to the medication.  She does have a urine culture with her.  She denies fever or chills..  Review of Systems  Positive: Nausea/vomiting Negative: Denies abdominal pain, fever, chills, discharge  Physical Exam  BP 100/69   Pulse 88   Temp 98.7 F (37.1 C) (Oral)   Resp 18   Ht 5' (1.524 m)   Wt 72 kg   SpO2 97%   BMI 31.00 kg/m  Gen:   Awake, no distress   Resp:  Normal effort  MSK:   Moves extremities without difficulty  Other:  Heart sounds are normal, regular rate and rhythm  Medical Decision Making  Medically screening exam initiated at 4:33 PM.  Appropriate orders placed.  Julie Khan was informed that the remainder of the evaluation will be completed by another provider, this initial triage assessment does not replace that evaluation, and the importance of remaining in the ED until their evaluation is complete.  Did see the patient she was instructed to stay as she may need IV fluids.  Told her her labs are still pending but she appears to be stable.   Versie Starks, PA-C 10/22/20 1704    Lavonia Drafts, MD 10/22/20 1710

## 2020-10-22 NOTE — ED Notes (Signed)
Pt alert and oriented x 4, verbalized understanding of discharge instructions, no questions at this time

## 2020-10-24 ENCOUNTER — Encounter: Payer: Self-pay | Admitting: Physician Assistant

## 2020-10-24 ENCOUNTER — Ambulatory Visit (INDEPENDENT_AMBULATORY_CARE_PROVIDER_SITE_OTHER): Payer: Medicare Other | Admitting: Physician Assistant

## 2020-10-24 ENCOUNTER — Other Ambulatory Visit: Payer: Self-pay

## 2020-10-24 VITALS — BP 135/74 | HR 98 | Temp 97.9°F | Ht 60.0 in | Wt 155.0 lb

## 2020-10-24 DIAGNOSIS — R319 Hematuria, unspecified: Secondary | ICD-10-CM | POA: Diagnosis not present

## 2020-10-24 DIAGNOSIS — R310 Gross hematuria: Secondary | ICD-10-CM

## 2020-10-24 LAB — URINE CULTURE: Culture: <10000 — AB

## 2020-10-24 NOTE — Progress Notes (Signed)
10/24/2020 9:03 AM   Julie Khan 02-20-1935 [id number]  CC: Chief Complaint  Patient presents with   Hematuria    HPI: Julie Khan is a 86 y.o. female with PMH smoldering myeloma and bladder cancer no longer undergoing active surveillance who presents today for evaluation of gross hematuria.   Patient was diagnosed with UTI earlier this month and started on empiric Macrobid, no culture data available for review.  She then presented to the emergency department 8 days ago with reports of emesis, fever, and weakness.  She was noted to be significantly hypotensive to 63/48 and was recommended for admission with sepsis of urinary versus pulmonary origin.  She left the emergency department AGAINST MEDICAL ADVICE and antibiotics were switched to Levaquin 750 mg daily every 48 hours x10 days (renal dosing).  She underwent CT stone study that day, which revealed no urologic abnormalities, hydronephrosis, or urolithiasis.  On personal review, it appears she did have some bladder wall thickening but no significant perivesical stranding.  Urine and blood cultures from that visit have finalized with no growth.  She again presented to the emergency department 2 days ago with reports of a single episode of emesis.  This time, her hypotension had improved; triage BP was 100/69.  Urinalysis at that time was notable for mild pyuria and non-squamous epithelial cells.  Repeat urine culture has since finalized with insignificant growth.  Today she reports she had gross hematuria 14 to 15 days ago when she first presented to her PCP.  This has since resolved.  She is extremely concerned about it, as her past bladder cancer diagnosis also started with an episode of gross hematuria.  She has 1 dose of Levaquin remaining.  On chart review, she did not have microscopic hematuria during either of her recent ED visits.  In-office UA today positive for trace leukocyte esterase; urine microscopy pan  negative.  PMH: Past Medical History:  Diagnosis Date   Arthritis    Bladder cancer ([place])    Bladder cancer ([place])    Cancer ([place])    multiple myeloma   Chronic kidney disease    Chronic kidney disease    Dizziness    GERD (gastroesophageal reflux disease)    Hypertension    Multiple myeloma ([place])    Stroke ([place])    tia x 2   Tubular adenoma of colon     Surgical History: Past Surgical History:  Procedure Laterality Date   bladder tumor removed     BREAST EXCISIONAL BIOPSY Bilateral 1970   Benign   BREAST SURGERY     bx   CATARACT EXTRACTION W/PHACO Left 12/16/2016   Procedure: CATARACT EXTRACTION PHACO AND INTRAOCULAR LENS PLACEMENT ([place]  Surgeon: Eulogio Bear, MD;  Location: [facility] ORS;  Service: Ophthalmology;  Laterality: Left;  [place] 00:38.0 AP% 14.6 CDE 5.54 Fluid pack lot # [id number]   CATARACT EXTRACTION W/PHACO Right 03/03/2017   Procedure: CATARACT EXTRACTION PHACO AND INTRAOCULAR LENS PLACEMENT ([facility]);  Surgeon: Eulogio Bear, MD;  Location: [facility];  Service: Ophthalmology;  Laterality: Right;  Lot # [id number] [place]: 00:29.8 AP%: 10.1 CDE: 3.01   COLONOSCOPY WITH PROPOFOL N/A 07/25/2015   Procedure: COLONOSCOPY WITH PROPOFOL;  Surgeon: Josefine Class, MD;  Location: [facility] ENDOSCOPY;  Service: Endoscopy;  Laterality: N/A;   COLONOSCOPY WITH PROPOFOL N/A 06/29/2017   Procedure: COLONOSCOPY WITH PROPOFOL;  Surgeon: Toledo, Benay Pike, MD;  Location: [facility] ENDOSCOPY;  Service: Gastroenterology;  Laterality: N/A;   CORONARY ANGIOGRAPHY N/A 05/24/2019  Procedure: CORONARY ANGIOGRAPHY;  Surgeon: Dionisio David, MD;  Location: [facility];  Service: Cardiovascular;  Laterality: N/A;   ESOPHAGOGASTRODUODENOSCOPY (EGD) WITH PROPOFOL N/A 07/25/2015   Procedure: ESOPHAGOGASTRODUODENOSCOPY (EGD) WITH PROPOFOL;  Surgeon: Josefine Class, MD;  Location: [facility] ENDOSCOPY;  Service: Endoscopy;  Laterality: N/A;   ESOPHAGOGASTRODUODENOSCOPY (EGD) WITH PROPOFOL N/A  06/29/2017   Procedure: ESOPHAGOGASTRODUODENOSCOPY (EGD) WITH PROPOFOL;  Surgeon: Toledo, Benay Pike, MD;  Location: [facility] ENDOSCOPY;  Service: Gastroenterology;  Laterality: N/A;   HERNIA REPAIR     LEFT HEART CATH N/A 05/24/2019   Procedure: Left Heart Cath;  Surgeon: Dionisio David, MD;  Location: [facility];  Service: Cardiovascular;  Laterality: N/A;    Home Medications:  Allergies as of 10/24/2020       Reactions   Iodine    Seizure Betadine ok Other reaction(s): Other (See Comments) Sneezing Seizure Betadine ok   Sucralfate Rash   Penicillins Other (See Comments), Rash   Has patient had a PCN reaction causing immediate rash, facial/tongue/throat swelling, SOB or lightheadedness with hypotension: Unknown Has patient had a PCN reaction causing severe rash involving mucus membranes or skin necrosis: Unknown Has patient had a PCN reaction that required hospitalization: Unknown Has patient had a PCN reaction occurring within the last 10 years: Unknown If all of the above answers are "NO", then may proceed with Cephalosporin use. Other reaction(s): Other (See Comments) Has patient had a PCN reaction causing immediate rash, facial/tongue/throat swelling, SOB or lightheadedness with hypotension: Unknown Has patient had a PCN reaction causing severe rash involving mucus membranes or skin necrosis: Unknown Has patient had a PCN reaction that required hospitalization: Unknown Has patient had a PCN reaction occurring within the last 10 years: Unknown If all of the above answers are "NO", then may proceed with Cephalosporin use. Has patient had a PCN reaction causing immediate rash, facial/tongue/throat swelling, SOB or lightheadedness with hypotension: Unknown Has patient had a PCN reaction causing severe rash involving mucus membranes or skin necrosis: Unknown Has patient had a PCN reaction that required hospitalization: Unknown Has patient had a PCN reaction occurring within the  last 10 years: Unknown If all of the above answers are "NO", then may proceed with Cephalosporin use.        Medication List        Accurate as of October 24, 2020  9:03 AM. If you have any questions, ask your nurse or doctor.          Centrum Silver 50+Women Tabs Take 1 tablet by mouth daily.   Fish Oil 1000 MG Caps Take 1 capsule by mouth daily.   levofloxacin 750 MG tablet Commonly known as: Levaquin Take 1 tablet (750 mg total) by mouth every other day for 5 doses.   losartan 25 MG tablet Commonly known as: COZAAR Take 25 mg by mouth daily.   meclizine 25 MG tablet Commonly known as: ANTIVERT Take 25 mg by mouth 3 (three) times daily as needed.   metoprolol succinate 25 MG 24 hr tablet Commonly known as: TOPROL-XL Take 50 mg by mouth daily.   nitrofurantoin (macrocrystal-monohydrate) 100 MG capsule Commonly known as: MACROBID Take 100 mg by mouth 2 (two) times daily.   nitroGLYCERIN 0.3 MG SL tablet Commonly known as: NITROSTAT Place 1 tablet under the tongue as needed.   ondansetron 4 MG disintegrating tablet Commonly known as: Zofran ODT Take 1 tablet (4 mg total) by mouth every 8 (eight) hours as needed for up to  3 days for nausea or vomiting.   pantoprazole 40 MG tablet Commonly known as: PROTONIX Take 40 mg by mouth 2 (two) times daily.   ranolazine 500 MG 12 hr tablet Commonly known as: RANEXA Take 500 mg by mouth 2 (two) times daily.   rOPINIRole 0.25 MG tablet Commonly known as: REQUIP Take 1 tablet by mouth as needed.   rosuvastatin 10 MG tablet Commonly known as: CRESTOR Take 10 mg by mouth daily.        Allergies:  Allergies  Allergen Reactions   Iodine     Seizure  Betadine ok Other reaction(s): Other (See Comments) Sneezing Seizure  Betadine ok   Sucralfate Rash   Penicillins Other (See Comments) and Rash    Has patient had a PCN reaction causing immediate rash, facial/tongue/throat swelling, SOB or lightheadedness with  hypotension: Unknown Has patient had a PCN reaction causing severe rash involving mucus membranes or skin necrosis: Unknown Has patient had a PCN reaction that required hospitalization: Unknown Has patient had a PCN reaction occurring within the last 10 years: Unknown If all of the above answers are "NO", then may proceed with Cephalosporin use.  Other reaction(s): Other (See Comments) Has patient had a PCN reaction causing immediate rash, facial/tongue/throat swelling, SOB or lightheadedness with hypotension: Unknown Has patient had a PCN reaction causing severe rash involving mucus membranes or skin necrosis: Unknown Has patient had a PCN reaction that required hospitalization: Unknown Has patient had a PCN reaction occurring within the last 10 years: Unknown If all of the above answers are "NO", then may proceed with Cephalosporin use. Has patient had a PCN reaction causing immediate rash, facial/tongue/throat swelling, SOB or lightheadedness with hypotension: Unknown Has patient had a PCN reaction causing severe rash involving mucus membranes or skin necrosis: Unknown Has patient had a PCN reaction that required hospitalization: Unknown Has patient had a PCN reaction occurring within the last 10 years: Unknown If all of the above answers are "NO", then may proceed with Cephalosporin use.    Family History: Family History  Problem Relation Age of Onset   Cancer Mother    Colon cancer Mother    Colon polyps Mother    Cancer Sister    Breast cancer Sister 66   Cancer Brother    Bladder Cancer Neg Hx    Kidney cancer Neg Hx     Social History:   reports that she has quit smoking. She has never used smokeless tobacco. She reports that she does not drink alcohol and does not use drugs.  Physical Exam: BP 135/74   Pulse 98   Temp 97.9 F (36.6 C) (Oral)   Ht 5' (1.524 m)   Wt 155 lb (70.3 kg)   BMI 30.27 kg/m   Constitutional:  Alert and oriented, no acute distress, nontoxic  appearing HEENT: Julie Khan, AT Cardiovascular: No clubbing, cyanosis, or edema Respiratory: Normal respiratory effort, no increased work of breathing Skin: No rashes, bruises or suspicious lesions Neurologic: Grossly intact, no focal deficits, moving all 4 extremities Psychiatric: Normal mood and affect  Laboratory Data: Results for orders placed or performed in visit on 10/24/20  Microscopic Examination   Urine  Result Value Ref Range   WBC, UA 0-5 0 - 5 /hpf   RBC 0-2 0 - 2 /hpf   Epithelial Cells (non renal) 0-10 0 - 10 /hpf   Renal Epithel, UA 0-10 (A) None seen /hpf   Bacteria, UA None seen None seen/Few  Urinalysis, Complete  Result  Value Ref Range   Specific Gravity, UA <1.030 1.005 - 1.030   pH, UA 5.0 5.0 - 7.5   Color, UA Yellow Yellow   Appearance Ur Clear Clear   Leukocytes,UA Trace (A) Negative   Protein,UA Negative Negative/Trace   Glucose, UA Negative Negative   Ketones, UA Negative Negative   RBC, UA Negative Negative   Bilirubin, UA Negative Negative   Urobilinogen, Ur 0.2 0.2 - 1.0 mg/dL   Nitrite, UA Negative Negative   Microscopic Examination See below:    Pertinent Imaging: Results for orders placed during the hospital encounter of 10/16/20  CT Renal Stone Study  Narrative CLINICAL DATA:  Worsening abdominal pain and patient diagnosed with a UTI.  EXAM: CT ABDOMEN AND PELVIS WITHOUT CONTRAST  TECHNIQUE: Multidetector CT imaging of the abdomen and pelvis was performed following the standard protocol without IV contrast.  COMPARISON:  CT abdomen and pelvis 05/23/2019  FINDINGS: Lower chest: There is some dependent atelectasis or scar in the lung bases.  Hepatobiliary: No focal liver abnormality is seen. No gallstones, gallbladder wall thickening, or biliary dilatation.  Pancreas: Unremarkable. No pancreatic ductal dilatation or surrounding inflammatory changes.  Spleen: Normal in size without focal abnormality.  Adrenals/Urinary Tract:  Adrenal glands are unremarkable. Kidneys are normal, without renal calculi, focal lesion, or hydronephrosis. Bladder is unremarkable.  Stomach/Bowel: Stomach is within normal limits with a small hiatal hernia noted. Appendix appears normal. No evidence of bowel wall thickening, distention, or inflammatory changes.  Vascular/Lymphatic: Aortic atherosclerosis. No enlarged abdominal or pelvic lymph nodes.  Reproductive: Uterus and bilateral adnexa are unremarkable.  Other: None.  Musculoskeletal: No acute or focal abnormality. Lower lumbar spondylosis noted.  IMPRESSION: No acute finding.  Negative for urinary tract stone.  Small hiatal hernia.  Aortic Atherosclerosis (ICD10-I70.0).   Electronically Signed By: Inge Rise M.D. On: 10/16/2020 14:53  I personally reviewed the images referenced above and note no hydronephrosis or urolithiasis.  She does appear to have circumferential bladder wall thickening without perivesical stranding.  Assessment & Plan:   1. Gross hematuria Associated with urinary tract infection and since resolved.  I explained to the patient that her gross hematuria is very likely associated with her recent infection and encouraged her to complete Levaquin as prescribed.  Notably, UA is benign today and she has no microscopic hematuria.  However, given patient's history of bladder cancer and extreme concern about her recent gross hematuria, not inappropriate to proceed with cystoscopy at this time for further evaluation. - Urinalysis, Complete   Return in about 2 weeks (around 11/07/2020) for Cystoscopy with Dr. Erlene Quan.  Debroah Loop, PA-C  [facility] [address], [place], [place] [address] [phone]

## 2020-10-24 NOTE — Patient Instructions (Signed)
Finish your Levaquin (levofloxacin) antibiotic.

## 2020-10-27 LAB — MULTIPLE MYELOMA PANEL, SERUM
Albumin SerPl Elph-Mcnc: 3.5 g/dL (ref 2.9–4.4)
Albumin/Glob SerPl: 0.7 (ref 0.7–1.7)
Alpha 1: 0.3 g/dL (ref 0.0–0.4)
Alpha2 Glob SerPl Elph-Mcnc: 0.7 g/dL (ref 0.4–1.0)
B-Globulin SerPl Elph-Mcnc: 0.9 g/dL (ref 0.7–1.3)
Gamma Glob SerPl Elph-Mcnc: 3.4 g/dL — ABNORMAL HIGH (ref 0.4–1.8)
Globulin, Total: 5.3 g/dL — ABNORMAL HIGH (ref 2.2–3.9)
IgA: 62 mg/dL — ABNORMAL LOW (ref 64–422)
IgG (Immunoglobin G), Serum: 4293 mg/dL — ABNORMAL HIGH (ref 586–1602)
IgM (Immunoglobulin M), Srm: 86 mg/dL (ref 26–217)
M Protein SerPl Elph-Mcnc: 3.1 g/dL — ABNORMAL HIGH
Total Protein ELP: 8.8 g/dL — ABNORMAL HIGH (ref 6.0–8.5)

## 2020-10-27 LAB — MICROSCOPIC EXAMINATION: Bacteria, UA: NONE SEEN

## 2020-10-27 LAB — URINALYSIS, COMPLETE
Bilirubin, UA: NEGATIVE
Glucose, UA: NEGATIVE
Ketones, UA: NEGATIVE
Nitrite, UA: NEGATIVE
Protein,UA: NEGATIVE
RBC, UA: NEGATIVE
Specific Gravity, UA: <1.03 (ref 1.005–1.030)
Urobilinogen, Ur: 0.2 mg/dL (ref 0.2–1.0)
pH, UA: 5 (ref 5.0–7.5)

## 2020-10-27 NOTE — Progress Notes (Signed)
[facility]  Telephone:([phone] [phone]  ID: Julie Khan OB: 07-May-1934  MR#: [id number]  WSF#:[id number]  Patient Care Team: Jodi Marble, MD as PCP - General (Internal Medicine)   CHIEF COMPLAINT: Smoldering myeloma  INTERVAL HISTORY: Patient returns to clinic today for repeat laboratory work and further evaluation.  She had a good vacation to [place], but then got severely sick near the end of the trip and has not fully recovered.  She continues to have persistent weakness and fatigue. She has no neurologic complaints.  She denies any bony pain.  She denies any fevers.  She has a good appetite and denies weight loss.  She denies any chest pain, shortness of breath, cough, or hemoptysis.  She denies any nausea, vomiting, constipation, or diarrhea.  She has no urinary complaints.  Patient offers no further specific complaints today.  REVIEW OF SYSTEMS:   Review of Systems  Constitutional:  Positive for malaise/fatigue. Negative for fever and weight loss.  Respiratory: Negative.  Negative for cough and shortness of breath.   Cardiovascular: Negative.  Negative for chest pain and leg swelling.  Gastrointestinal: Negative.  Negative for abdominal pain, blood in stool and melena.  Genitourinary: Negative.  Negative for dysuria.  Musculoskeletal: Negative.  Negative for back pain.  Skin: Negative.  Negative for rash.  Neurological:  Positive for weakness. Negative for sensory change and focal weakness.  Psychiatric/Behavioral: Negative.  The patient is not nervous/anxious.    As per HPI. Otherwise, a complete review of systems is negative.  PAST MEDICAL HISTORY: Past Medical History:  Diagnosis Date   Arthritis    Bladder cancer ([place])    Bladder cancer ([place])    Cancer ([place])    multiple myeloma   Chronic kidney disease    Chronic kidney disease    Dizziness    GERD (gastroesophageal reflux disease)    Hypertension    Multiple myeloma ([place])     Stroke ([place])    tia x 2   Tubular adenoma of colon     PAST SURGICAL HISTORY: Past Surgical History:  Procedure Laterality Date   bladder tumor removed     BREAST EXCISIONAL BIOPSY Bilateral 1970   Benign   BREAST SURGERY     bx   CATARACT EXTRACTION W/PHACO Left 12/16/2016   Procedure: CATARACT EXTRACTION PHACO AND INTRAOCULAR LENS PLACEMENT ([place]);  Surgeon: Eulogio Bear, MD;  Location: [facility] ORS;  Service: Ophthalmology;  Laterality: Left;  [place] 00:38.0 AP% 14.6 CDE 5.54 Fluid pack lot # [id number]   CATARACT EXTRACTION W/PHACO Right 03/03/2017   Procedure: CATARACT EXTRACTION PHACO AND INTRAOCULAR LENS PLACEMENT ([facility]);  Surgeon: Eulogio Bear, MD;  Location: [facility] ORS;  Service: Ophthalmology;  Laterality: Right;  Lot # [id number] H [place]: 00:29.8 AP%: 10.1 CDE: 3.01   COLONOSCOPY WITH PROPOFOL N/A 07/25/2015   Procedure: COLONOSCOPY WITH PROPOFOL;  Surgeon: Josefine Class, MD;  Location: [facility] ENDOSCOPY;  Service: Endoscopy;  Laterality: N/A;   COLONOSCOPY WITH PROPOFOL N/A 06/29/2017   Procedure: COLONOSCOPY WITH PROPOFOL;  Surgeon: Toledo, Benay Pike, MD;  Location: [facility] ENDOSCOPY;  Service: Gastroenterology;  Laterality: N/A;   CORONARY ANGIOGRAPHY N/A 05/24/2019   Procedure: CORONARY ANGIOGRAPHY;  Surgeon: Dionisio David, MD;  Location: [place] CV LAB;  Service: Cardiovascular;  Laterality: N/A;   ESOPHAGOGASTRODUODENOSCOPY (EGD) WITH PROPOFOL N/A 07/25/2015   Procedure: ESOPHAGOGASTRODUODENOSCOPY (EGD) WITH PROPOFOL;  Surgeon: Josefine Class, MD;  Location: [facility] ENDOSCOPY;  Service: Endoscopy;  Laterality: N/A;   ESOPHAGOGASTRODUODENOSCOPY (  EGD) WITH PROPOFOL N/A 06/29/2017   Procedure: ESOPHAGOGASTRODUODENOSCOPY (EGD) WITH PROPOFOL;  Surgeon: Toledo, Benay Pike, MD;  Location: [facility] ENDOSCOPY;  Service: Gastroenterology;  Laterality: N/A;   HERNIA REPAIR     LEFT HEART CATH N/A 05/24/2019   Procedure: Left Heart Cath;  Surgeon: Dionisio David, MD;  Location: [place] CV LAB;  Service: Cardiovascular;  Laterality: N/A;    FAMILY HISTORY: Family History  Problem Relation Age of Onset   Cancer Mother    Colon cancer Mother    Colon polyps Mother    Cancer Sister    Breast cancer Sister 81   Cancer Brother    Bladder Cancer Neg Hx    Kidney cancer Neg Hx     ADVANCED DIRECTIVES (Y/N):  N  HEALTH MAINTENANCE: Social History   Tobacco Use   Smoking status: Former   Smokeless tobacco: Never  Scientific laboratory technician Use: Never used  Substance Use Topics   Alcohol use: No   Drug use: No     Colonoscopy:  PAP:  Bone density:  Lipid panel:  Allergies  Allergen Reactions   Iodine     Seizure  Betadine ok Other reaction(s): Other (See Comments) Sneezing Seizure  Betadine ok   Sucralfate Rash   Penicillins Other (See Comments) and Rash    Has patient had a PCN reaction causing immediate rash, facial/tongue/throat swelling, SOB or lightheadedness with hypotension: Unknown Has patient had a PCN reaction causing severe rash involving mucus membranes or skin necrosis: Unknown Has patient had a PCN reaction that required hospitalization: Unknown Has patient had a PCN reaction occurring within the last 10 years: Unknown If all of the above answers are "NO", then may proceed with Cephalosporin use.  Other reaction(s): Other (See Comments) Has patient had a PCN reaction causing immediate rash, facial/tongue/throat swelling, SOB or lightheadedness with hypotension: Unknown Has patient had a PCN reaction causing severe rash involving mucus membranes or skin necrosis: Unknown Has patient had a PCN reaction that required hospitalization: Unknown Has patient had a PCN reaction occurring within the last 10 years: Unknown If all of the above answers are "NO", then may proceed with Cephalosporin use. Has patient had a PCN reaction causing immediate rash, facial/tongue/throat swelling, SOB or lightheadedness with hypotension: Unknown Has  patient had a PCN reaction causing severe rash involving mucus membranes or skin necrosis: Unknown Has patient had a PCN reaction that required hospitalization: Unknown Has patient had a PCN reaction occurring within the last 10 years: Unknown If all of the above answers are "NO", then may proceed with Cephalosporin use.    Current Outpatient Medications  Medication Sig Dispense Refill   losartan (COZAAR) 25 MG tablet Take 25 mg by mouth daily.     meclizine (ANTIVERT) 25 MG tablet Take 25 mg by mouth 3 (three) times daily as needed.   1   metoprolol succinate (TOPROL-XL) 25 MG 24 hr tablet Take 50 mg by mouth daily.      Multiple Vitamins-Minerals (CENTRUM SILVER 50+WOMEN) TABS Take 1 tablet by mouth daily.     Omega-3 Fatty Acids (FISH OIL) 1000 MG CAPS Take 1 capsule by mouth daily.     pantoprazole (PROTONIX) 40 MG tablet Take 40 mg by mouth 2 (two) times daily.     ranolazine (RANEXA) 500 MG 12 hr tablet Take 500 mg by mouth 2 (two) times daily.     rOPINIRole (REQUIP) 0.25 MG tablet Take 1 tablet by mouth as needed.  rosuvastatin (CRESTOR) 10 MG tablet Take 10 mg by mouth daily.     nitroGLYCERIN (NITROSTAT) 0.3 MG SL tablet Place 1 tablet under the tongue as needed. (Patient not taking: Reported on 10/28/2020)     No current facility-administered medications for this visit.   Facility-Administered Medications Ordered in Other Visits  Medication Dose Route Frequency Provider Last Rate Last Admin   sodium chloride flush (NS) 0.9 % injection 3 mL  3 mL Intravenous Q12H Neoma Laming A, MD        OBJECTIVE: Vitals:   10/28/20 1324  BP: 120/73  Pulse: 81  Resp: 18  Temp: 98.2 F (36.8 C)  SpO2: 99%     Body mass index is 29.88 kg/m.    ECOG FS:0 - Asymptomatic  General: Well-developed, well-nourished, no acute distress. Eyes: Pink conjunctiva, anicteric sclera. HEENT: Normocephalic, moist mucous membranes. Lungs: No audible wheezing or coughing. Heart: Regular rate and  rhythm. Abdomen: Soft, nontender, no obvious distention. Musculoskeletal: No edema, cyanosis, or clubbing. Neuro: Alert, answering all questions appropriately. Cranial nerves grossly intact. Skin: No rashes or petechiae noted. Psych: Normal affect.   LAB RESULTS:  Lab Results  Component Value Date   NA 129 (L) 10/22/2020   K 4.5 10/22/2020   CL 96 (L) 10/22/2020   CO2 24 10/22/2020   GLUCOSE 113 (H) 10/22/2020   BUN 29 (H) 10/22/2020   CREATININE 1.86 (H) 10/22/2020   CALCIUM 8.7 (L) 10/22/2020   PROT 8.4 (H) 10/22/2020   ALBUMIN 3.0 (L) 10/22/2020   AST 34 10/22/2020   ALT 18 10/22/2020   ALKPHOS 72 10/22/2020   BILITOT 0.9 10/22/2020   GFRNONAA 26 (L) 10/22/2020   GFRAA 33 (L) 11/19/2019    Lab Results  Component Value Date   WBC 9.1 10/22/2020   NEUTROABS 3.2 10/21/2020   HGB 10.9 (L) 10/22/2020   HCT 31.6 (L) 10/22/2020   MCV 97.5 10/22/2020   PLT 180 10/22/2020      STUDIES: DG Chest Port 1 View  Result Date: 10/16/2020 CLINICAL DATA:  Question sepsis EXAM: PORTABLE CHEST 1 VIEW COMPARISON:  None. FINDINGS: Heart size within normal limits. Atherosclerotic calcification aortic arch. Negative for heart failure. Mild streaky bibasilar airspace densities left greater than right likely atelectasis. No definite pneumonia or effusion. IMPRESSION: Mild bibasilar atelectasis left greater than right Electronically Signed   By: Franchot Gallo M.D.   On: 10/16/2020 14:27   CT Renal Stone Study  Result Date: 10/16/2020 CLINICAL DATA:  Worsening abdominal pain and patient diagnosed with a UTI. EXAM: CT ABDOMEN AND PELVIS WITHOUT CONTRAST TECHNIQUE: Multidetector CT imaging of the abdomen and pelvis was performed following the standard protocol without IV contrast. COMPARISON:  CT abdomen and pelvis 05/23/2019 FINDINGS: Lower chest: There is some dependent atelectasis or scar in the lung bases. Hepatobiliary: No focal liver abnormality is seen. No gallstones, gallbladder wall  thickening, or biliary dilatation. Pancreas: Unremarkable. No pancreatic ductal dilatation or surrounding inflammatory changes. Spleen: Normal in size without focal abnormality. Adrenals/Urinary Tract: Adrenal glands are unremarkable. Kidneys are normal, without renal calculi, focal lesion, or hydronephrosis. Bladder is unremarkable. Stomach/Bowel: Stomach is within normal limits with a small hiatal hernia noted. Appendix appears normal. No evidence of bowel wall thickening, distention, or inflammatory changes. Vascular/Lymphatic: Aortic atherosclerosis. No enlarged abdominal or pelvic lymph nodes. Reproductive: Uterus and bilateral adnexa are unremarkable. Other: None. Musculoskeletal: No acute or focal abnormality. Lower lumbar spondylosis noted. IMPRESSION: No acute finding.  Negative for urinary tract stone. Small hiatal  hernia. Aortic Atherosclerosis (ICD10-I70.0). Electronically Signed   By: Inge Rise M.D.   On: 10/16/2020 14:53    ASSESSMENT: Smoldering myeloma  PLAN:   1. Smoldering myeloma: Bone marrow biopsy on December 23, 2011 revealed 15% plasma cells with cytogenetics having monosomy 13 in approximately 7%.  Repeat bone marrow biopsy on May 19, 2020 was essentially unchanged with approximately 15% plasma cells with monosomy 13 cytogenetics.  Her most recent skeletal survey on April 10, 2020 reviewed independently with no radiographic sign of multiple myeloma or progressive disease.  Previously, patient's M spike was relatively stable between 2.0 and 2.5.  More recently, her level has been slightly above 3.0.  IgG levels also trended up, but now have stabilized at 4293.  Kappa free light chains have also increased to 184.3.  Patient has chronic renal insufficiency as well as anemia, but these are essentially unchanged.  No intervention is needed.  Patient does not require treatment at this time.  Return to clinic in 4 months with repeat laboratory work and further evaluation. 2.  Renal insufficiency: Patient's creatinine has trended up to 1.86.  May be some residual dehydration from her recent illness. Appreciate nephrology input. Bone marrow biopsy results as above. 3.  Anemia: Chronic and unchanged. Patient's hemoglobin in 10.9 today.    Lloyd Huger, MD   10/29/2020 9:38 AM

## 2020-10-28 ENCOUNTER — Other Ambulatory Visit: Payer: Self-pay

## 2020-10-28 ENCOUNTER — Inpatient Hospital Stay (HOSPITAL_BASED_OUTPATIENT_CLINIC_OR_DEPARTMENT_OTHER): Payer: Medicare Other | Admitting: Oncology

## 2020-10-28 VITALS — BP 120/73 | HR 81 | Temp 98.2°F | Resp 18 | Wt 153.0 lb

## 2020-10-28 DIAGNOSIS — N390 Urinary tract infection, site not specified: Secondary | ICD-10-CM | POA: Diagnosis not present

## 2020-10-28 DIAGNOSIS — Z86010 Personal history of colonic polyps: Secondary | ICD-10-CM | POA: Diagnosis not present

## 2020-10-28 DIAGNOSIS — N189 Chronic kidney disease, unspecified: Secondary | ICD-10-CM | POA: Diagnosis not present

## 2020-10-28 DIAGNOSIS — D472 Monoclonal gammopathy: Secondary | ICD-10-CM

## 2020-10-28 DIAGNOSIS — K219 Gastro-esophageal reflux disease without esophagitis: Secondary | ICD-10-CM | POA: Diagnosis not present

## 2020-10-28 DIAGNOSIS — I129 Hypertensive chronic kidney disease with stage 1 through stage 4 chronic kidney disease, or unspecified chronic kidney disease: Secondary | ICD-10-CM | POA: Diagnosis not present

## 2020-10-28 DIAGNOSIS — R531 Weakness: Secondary | ICD-10-CM | POA: Diagnosis not present

## 2020-10-28 DIAGNOSIS — Z79899 Other long term (current) drug therapy: Secondary | ICD-10-CM | POA: Diagnosis not present

## 2020-10-28 DIAGNOSIS — Z8673 Personal history of transient ischemic attack (TIA), and cerebral infarction without residual deficits: Secondary | ICD-10-CM | POA: Diagnosis not present

## 2020-10-28 DIAGNOSIS — I700 Atherosclerosis of aorta: Secondary | ICD-10-CM | POA: Diagnosis not present

## 2020-10-28 DIAGNOSIS — K449 Diaphragmatic hernia without obstruction or gangrene: Secondary | ICD-10-CM | POA: Diagnosis not present

## 2020-10-28 DIAGNOSIS — D649 Anemia, unspecified: Secondary | ICD-10-CM | POA: Diagnosis not present

## 2020-10-28 DIAGNOSIS — C9000 Multiple myeloma not having achieved remission: Secondary | ICD-10-CM | POA: Diagnosis not present

## 2020-10-28 DIAGNOSIS — R5383 Other fatigue: Secondary | ICD-10-CM | POA: Diagnosis not present

## 2020-10-28 DIAGNOSIS — M47816 Spondylosis without myelopathy or radiculopathy, lumbar region: Secondary | ICD-10-CM | POA: Diagnosis not present

## 2020-10-28 DIAGNOSIS — Z8551 Personal history of malignant neoplasm of bladder: Secondary | ICD-10-CM | POA: Diagnosis not present

## 2020-10-29 DIAGNOSIS — N183 Chronic kidney disease, stage 3 unspecified: Secondary | ICD-10-CM | POA: Diagnosis not present

## 2020-10-29 DIAGNOSIS — N390 Urinary tract infection, site not specified: Secondary | ICD-10-CM | POA: Diagnosis not present

## 2020-10-29 DIAGNOSIS — G2581 Restless legs syndrome: Secondary | ICD-10-CM | POA: Diagnosis not present

## 2020-10-29 DIAGNOSIS — I10 Essential (primary) hypertension: Secondary | ICD-10-CM | POA: Diagnosis not present

## 2020-10-29 DIAGNOSIS — K219 Gastro-esophageal reflux disease without esophagitis: Secondary | ICD-10-CM | POA: Diagnosis not present

## 2020-10-29 DIAGNOSIS — E785 Hyperlipidemia, unspecified: Secondary | ICD-10-CM | POA: Diagnosis not present

## 2020-10-29 DIAGNOSIS — H81391 Other peripheral vertigo, right ear: Secondary | ICD-10-CM | POA: Diagnosis not present

## 2020-10-29 DIAGNOSIS — R319 Hematuria, unspecified: Secondary | ICD-10-CM | POA: Diagnosis not present

## 2020-10-29 DIAGNOSIS — J301 Allergic rhinitis due to pollen: Secondary | ICD-10-CM | POA: Diagnosis not present

## 2020-10-29 DIAGNOSIS — R609 Edema, unspecified: Secondary | ICD-10-CM | POA: Diagnosis not present

## 2020-11-05 DIAGNOSIS — N390 Urinary tract infection, site not specified: Secondary | ICD-10-CM | POA: Diagnosis not present

## 2020-11-05 DIAGNOSIS — R609 Edema, unspecified: Secondary | ICD-10-CM | POA: Diagnosis not present

## 2020-11-10 DIAGNOSIS — H81391 Other peripheral vertigo, right ear: Secondary | ICD-10-CM | POA: Diagnosis not present

## 2020-11-10 DIAGNOSIS — J301 Allergic rhinitis due to pollen: Secondary | ICD-10-CM | POA: Diagnosis not present

## 2020-11-10 DIAGNOSIS — R609 Edema, unspecified: Secondary | ICD-10-CM | POA: Diagnosis not present

## 2020-11-10 DIAGNOSIS — K219 Gastro-esophageal reflux disease without esophagitis: Secondary | ICD-10-CM | POA: Diagnosis not present

## 2020-11-10 DIAGNOSIS — I340 Nonrheumatic mitral (valve) insufficiency: Secondary | ICD-10-CM | POA: Diagnosis not present

## 2020-11-10 DIAGNOSIS — R0602 Shortness of breath: Secondary | ICD-10-CM | POA: Diagnosis not present

## 2020-11-10 DIAGNOSIS — G2581 Restless legs syndrome: Secondary | ICD-10-CM | POA: Diagnosis not present

## 2020-11-10 DIAGNOSIS — N183 Chronic kidney disease, stage 3 unspecified: Secondary | ICD-10-CM | POA: Diagnosis not present

## 2020-11-10 DIAGNOSIS — E785 Hyperlipidemia, unspecified: Secondary | ICD-10-CM | POA: Diagnosis not present

## 2020-11-10 DIAGNOSIS — N390 Urinary tract infection, site not specified: Secondary | ICD-10-CM | POA: Diagnosis not present

## 2020-11-10 DIAGNOSIS — I10 Essential (primary) hypertension: Secondary | ICD-10-CM | POA: Diagnosis not present

## 2020-11-11 ENCOUNTER — Ambulatory Visit (INDEPENDENT_AMBULATORY_CARE_PROVIDER_SITE_OTHER): Payer: Medicare Other | Admitting: Urology

## 2020-11-11 ENCOUNTER — Other Ambulatory Visit: Payer: Self-pay

## 2020-11-11 VITALS — BP 126/84 | HR 79 | Temp 98.0°F

## 2020-11-11 DIAGNOSIS — N390 Urinary tract infection, site not specified: Secondary | ICD-10-CM | POA: Diagnosis not present

## 2020-11-11 DIAGNOSIS — R310 Gross hematuria: Secondary | ICD-10-CM | POA: Diagnosis not present

## 2020-11-11 DIAGNOSIS — Z8551 Personal history of malignant neoplasm of bladder: Secondary | ICD-10-CM | POA: Diagnosis not present

## 2020-11-11 NOTE — Progress Notes (Signed)
11/11/2020 10:24 AM   Julie Khan 11/02/1934 [id number]  Referring provider: Jodi Marble, MD [place],  [place] [address]  Chief Complaint  Patient presents with   Cysto    HPI: 86 year old female with a remote history of bladder cancer who presents today scheduled for cystoscopy.  Unfortunately today, her urine is frankly positive with many bacteria, WBCs and nitrate positive.  She was seen just 2 weeks ago by Cathleen Fears at which time her urine appeared fairly benign.  She has been treated primarily by her primary care, Dr. Elijio Miles for recurrent infections over the past month or so.  She has been on and off antibiotics.  She is also been seen and evaluated in the emergency room x2 with hypotension and 1 time concerning for sepsis.  She ended up leaving AMA.  She reports that she just saw her primary care yesterday and her urine was positive then.  She was prescribed Cipro but she has not yet started taking this.  She is symptomatic today with urgency frequency and dysuria.  No fevers or chills.  She is afebrile and otherwise normotensive.  She has a remote history of bladder cancer greater than 20 years ago.  She underwent BCG x6.  She stopped doing surveillance cystoscopies back in 2018.  She was treated in [place].   PMH: Past Medical History:  Diagnosis Date   Arthritis    Bladder cancer ([place])    Bladder cancer ([place])    Cancer ([place])    multiple myeloma   Chronic kidney disease    Chronic kidney disease    Dizziness    GERD (gastroesophageal reflux disease)    Hypertension    Multiple myeloma ([place])    Stroke ([place])    tia x 2   Tubular adenoma of colon     Surgical History: Past Surgical History:  Procedure Laterality Date   bladder tumor removed     BREAST EXCISIONAL BIOPSY Bilateral 1970   Benign   BREAST SURGERY     bx   CATARACT EXTRACTION W/PHACO Left 12/16/2016   Procedure: CATARACT EXTRACTION PHACO AND INTRAOCULAR LENS  PLACEMENT ([place]);  Surgeon: Eulogio Bear, MD;  Location: [facility] ORS;  Service: Ophthalmology;  Laterality: Left;  [place] 00:38.0 AP% 14.6 CDE 5.54 Fluid pack lot # [id number]   CATARACT EXTRACTION W/PHACO Right 03/03/2017   Procedure: CATARACT EXTRACTION PHACO AND INTRAOCULAR LENS PLACEMENT ([facility]);  Surgeon: Eulogio Bear, MD;  Location: [facility] ORS;  Service: Ophthalmology;  Laterality: Right;  Lot # [id number] [place]: 00:29.8 AP%: 10.1 CDE: 3.01   COLONOSCOPY WITH PROPOFOL N/A 07/25/2015   Procedure: COLONOSCOPY WITH PROPOFOL;  Surgeon: Josefine Class, MD;  Location: [facility] ENDOSCOPY;  Service: Endoscopy;  Laterality: N/A;   COLONOSCOPY WITH PROPOFOL N/A 06/29/2017   Procedure: COLONOSCOPY WITH PROPOFOL;  Surgeon: Toledo, Benay Pike, MD;  Location: [facility] ENDOSCOPY;  Service: Gastroenterology;  Laterality: N/A;   CORONARY ANGIOGRAPHY N/A 05/24/2019   Procedure: CORONARY ANGIOGRAPHY;  Surgeon: Dionisio David, MD;  Location: [place] CV LAB;  Service: Cardiovascular;  Laterality: N/A;   ESOPHAGOGASTRODUODENOSCOPY (EGD) WITH PROPOFOL N/A 07/25/2015   Procedure: ESOPHAGOGASTRODUODENOSCOPY (EGD) WITH PROPOFOL;  Surgeon: Josefine Class, MD;  Location: [facility] ENDOSCOPY;  Service: Endoscopy;  Laterality: N/A;   ESOPHAGOGASTRODUODENOSCOPY (EGD) WITH PROPOFOL N/A 06/29/2017   Procedure: ESOPHAGOGASTRODUODENOSCOPY (EGD) WITH PROPOFOL;  Surgeon: Toledo, Benay Pike, MD;  Location: [facility] ENDOSCOPY;  Service: Gastroenterology;  Laterality: N/A;   HERNIA REPAIR     LEFT  HEART CATH N/A 05/24/2019   Procedure: Left Heart Cath;  Surgeon: Dionisio David, MD;  Location: [place] [facility] LAB;  Service: Cardiovascular;  Laterality: N/A;    Home Medications:  Allergies as of 11/11/2020       Reactions   Iodine    Seizure Betadine ok Other reaction(s): Other (See Comments) Sneezing Seizure Betadine ok   Sucralfate Rash   Penicillins Other (See Comments), Rash   Has patient had a PCN reaction causing immediate  rash, facial/tongue/throat swelling, SOB or lightheadedness with hypotension: Unknown Has patient had a PCN reaction causing severe rash involving mucus membranes or skin necrosis: Unknown Has patient had a PCN reaction that required hospitalization: Unknown Has patient had a PCN reaction occurring within the last 10 years: Unknown If all of the above answers are "NO", then may proceed with Cephalosporin use. Other reaction(s): Other (See Comments) Has patient had a PCN reaction causing immediate rash, facial/tongue/throat swelling, SOB or lightheadedness with hypotension: Unknown Has patient had a PCN reaction causing severe rash involving mucus membranes or skin necrosis: Unknown Has patient had a PCN reaction that required hospitalization: Unknown Has patient had a PCN reaction occurring within the last 10 years: Unknown If all of the above answers are "NO", then may proceed with Cephalosporin use. Has patient had a PCN reaction causing immediate rash, facial/tongue/throat swelling, SOB or lightheadedness with hypotension: Unknown Has patient had a PCN reaction causing severe rash involving mucus membranes or skin necrosis: Unknown Has patient had a PCN reaction that required hospitalization: Unknown Has patient had a PCN reaction occurring within the last 10 years: Unknown If all of the above answers are "NO", then may proceed with Cephalosporin use.        Medication List        Accurate as of November 11, 2020 10:24 AM. If you have any questions, ask your nurse or doctor.          Centrum Silver 50+Women Tabs Take 1 tablet by mouth daily.   Fish Oil 1000 MG Caps Take 1 capsule by mouth daily.   losartan 25 MG tablet Commonly known as: COZAAR Take 25 mg by mouth daily.   meclizine 25 MG tablet Commonly known as: ANTIVERT Take 25 mg by mouth 3 (three) times daily as needed.   metoprolol succinate 25 MG 24 hr tablet Commonly known as: TOPROL-XL Take 50 mg by mouth  daily.   nitrofurantoin (macrocrystal-monohydrate) 100 MG capsule Commonly known as: MACROBID Take 100 mg by mouth 2 (two) times daily.   nitroGLYCERIN 0.3 MG SL tablet Commonly known as: NITROSTAT Place 1 tablet under the tongue as needed.   pantoprazole 40 MG tablet Commonly known as: PROTONIX Take 40 mg by mouth 2 (two) times daily.   ranolazine 500 MG 12 hr tablet Commonly known as: RANEXA Take 500 mg by mouth 2 (two) times daily.   rOPINIRole 0.25 MG tablet Commonly known as: REQUIP Take 1 tablet by mouth as needed.   rosuvastatin 10 MG tablet Commonly known as: CRESTOR Take 10 mg by mouth daily.        Allergies:  Allergies  Allergen Reactions   Iodine     Seizure  Betadine ok Other reaction(s): Other (See Comments) Sneezing Seizure  Betadine ok   Sucralfate Rash   Penicillins Other (See Comments) and Rash    Has patient had a PCN reaction causing immediate rash, facial/tongue/throat swelling, SOB or lightheadedness with hypotension: Unknown Has patient had a PCN reaction causing  severe rash involving mucus membranes or skin necrosis: Unknown Has patient had a PCN reaction that required hospitalization: Unknown Has patient had a PCN reaction occurring within the last 10 years: Unknown If all of the above answers are "NO", then may proceed with Cephalosporin use.  Other reaction(s): Other (See Comments) Has patient had a PCN reaction causing immediate rash, facial/tongue/throat swelling, SOB or lightheadedness with hypotension: Unknown Has patient had a PCN reaction causing severe rash involving mucus membranes or skin necrosis: Unknown Has patient had a PCN reaction that required hospitalization: Unknown Has patient had a PCN reaction occurring within the last 10 years: Unknown If all of the above answers are "NO", then may proceed with Cephalosporin use. Has patient had a PCN reaction causing immediate rash, facial/tongue/throat swelling, SOB or  lightheadedness with hypotension: Unknown Has patient had a PCN reaction causing severe rash involving mucus membranes or skin necrosis: Unknown Has patient had a PCN reaction that required hospitalization: Unknown Has patient had a PCN reaction occurring within the last 10 years: Unknown If all of the above answers are "NO", then may proceed with Cephalosporin use.    Family History: Family History  Problem Relation Age of Onset   Cancer Mother    Colon cancer Mother    Colon polyps Mother    Cancer Sister    Breast cancer Sister 27   Cancer Brother    Bladder Cancer Neg Hx    Kidney cancer Neg Hx     Social History:  reports that she has quit smoking. She has never used smokeless tobacco. She reports that she does not drink alcohol and does not use drugs.   Physical Exam: BP 126/84   Pulse 79   Constitutional:  Alert and oriented, No acute distress. HEENT: [place] AT, moist mucus membranes.  Trachea midline, no masses. Cardiovascular: No clubbing, cyanosis, or edema. Respiratory: Normal respiratory effort, no increased work of breathing. Skin: No rashes, bruises or suspicious lesions. Neurologic: Grossly intact, no focal deficits, moving all 4 extremities. Psychiatric: Normal mood and affect.   Assessment & Plan:    1. Gross hematuria Agree with cystoscopy given her history of bladder cancer but recommend holding off today in the setting of acute symptomatic UTI given the probability of having erythema and risk worsening urinary symptoms and sepsis  See below - Urinalysis, Complete - CULTURE, URINE COMPREHENSIVE  2. Recurrent UTI Recurrent infection over the past 4 to 6 weeks, presumably positive urine cultures but her primary care physicians although do not have access to these  We will repeat her culture today given that she has not yet restarted antibiotics and request records from her primary care's office in the form of urine cultures to assess whether or not she has  been appropriately treated  If she continues to have infection which will clear, we may have to go ahead and cystoscopy in the office regardless to rule out infected tumor  She understands and is agreeable.  3. History of bladder cancer As above    Hollice Espy, MD  [facility] [address] [place], [place] [address] [phone]

## 2020-11-12 ENCOUNTER — Other Ambulatory Visit: Payer: Self-pay

## 2020-11-12 LAB — URINALYSIS, COMPLETE
Bilirubin, UA: NEGATIVE
Glucose, UA: NEGATIVE
Ketones, UA: NEGATIVE
Nitrite, UA: POSITIVE — AB
Protein,UA: NEGATIVE
RBC, UA: NEGATIVE
Specific Gravity, UA: 1.02 (ref 1.005–1.030)
Urobilinogen, Ur: 0.2 mg/dL (ref 0.2–1.0)
pH, UA: 5 (ref 5.0–7.5)

## 2020-11-12 LAB — MICROSCOPIC EXAMINATION: WBC, UA: >30 /hpf — AB (ref 0–5)

## 2020-11-14 LAB — CULTURE, URINE COMPREHENSIVE

## 2020-11-18 ENCOUNTER — Telehealth: Payer: Self-pay | Admitting: *Deleted

## 2020-11-18 NOTE — Telephone Encounter (Addendum)
Patient taking Nitrofurantonin-last dose today  ----- Message from Hollice Espy, MD sent at 11/18/2020  8:36 AM EDT ----- Can you check to see what her PCP prescribed and make sure it is appropriate?  Hollice Espy, MD

## 2020-11-21 DIAGNOSIS — J301 Allergic rhinitis due to pollen: Secondary | ICD-10-CM | POA: Diagnosis not present

## 2020-11-21 DIAGNOSIS — H81391 Other peripheral vertigo, right ear: Secondary | ICD-10-CM | POA: Diagnosis not present

## 2020-11-21 DIAGNOSIS — K219 Gastro-esophageal reflux disease without esophagitis: Secondary | ICD-10-CM | POA: Diagnosis not present

## 2020-11-21 DIAGNOSIS — R609 Edema, unspecified: Secondary | ICD-10-CM | POA: Diagnosis not present

## 2020-11-21 DIAGNOSIS — N183 Chronic kidney disease, stage 3 unspecified: Secondary | ICD-10-CM | POA: Diagnosis not present

## 2020-11-21 DIAGNOSIS — G2581 Restless legs syndrome: Secondary | ICD-10-CM | POA: Diagnosis not present

## 2020-11-21 DIAGNOSIS — N390 Urinary tract infection, site not specified: Secondary | ICD-10-CM | POA: Diagnosis not present

## 2020-11-21 DIAGNOSIS — K2971 Gastritis, unspecified, with bleeding: Secondary | ICD-10-CM | POA: Diagnosis not present

## 2020-11-21 DIAGNOSIS — E785 Hyperlipidemia, unspecified: Secondary | ICD-10-CM | POA: Diagnosis not present

## 2020-11-23 NOTE — Progress Notes (Signed)
   11/26/20  CC:  Chief Complaint  Patient presents with   Cysto     HPI: Julie Khan is a 86 y.o.female who returns today for scheduled cytoscopy.   She was last seen in office 11/11/2020 for cystoscopy but unfortunately her urine was frankly positive with many bacteria, WBCs and nitrate positive. Showed multidrug resistant E.coli for which she was treated by her PCP, currently on macrobid.  Urinary symptoms improved on abx.  No further gross blood.  She has been treated by he PCP, Dr. Adriana Mccallum for recurrent infections over the past month.    She has a remote history of bladder cancer greater then 20 years ago. She underwent BCG x6. She stopped doing surveillance cystoscopies back in 2018. She was treated in [place].   Urinalysis today is negative.    Vitals:   11/26/20 0920  BP: 116/76  Pulse: 93  NED. A&Ox3.   No respiratory distress   Abd soft, NT, ND Normal external genitalia with patent urethral meatus  Cystoscopy Procedure Note  Patient identification was confirmed, informed consent was obtained, and patient was prepped using Betadine solution.  Lidocaine jelly was administered per urethral meatus.    Procedure: - Flexible cystoscope introduced, without any difficulty.   - Thorough search of the bladder revealed:    normal urethral meatus    normal urothelium    no stones    no ulcers     no tumors    no urethral polyps    no trabeculation  - Ureteral orifices were normal in position and appearance.  Post-Procedure: - Patient tolerated the procedure well  Assessment/ Plan:  Gross hematuria  - Likely due to infection  - Urinalysis today was negative    Recurrent UTI / gross hematuria - RUS to check upper tract for recurrent UTI  - On Macrobid, urinalysis today showed no sign of nfection  -Would likely benefit from topical estrogen cream, pea-sized amount 3 times per week.  History of bladder cancer - NED, cysto is negative - As above     Follow-up with RUS with PA   I,Kailey Littlejohn,acting as a scribe for Hollice Espy, MD.,have documented all relevant documentation on the behalf of Hollice Espy, MD,as directed by  Hollice Espy, MD while in the presence of Hollice Espy, MD.  I have reviewed the above documentation for accuracy and completeness, and I agree with the above.   Hollice Espy, MD

## 2020-11-26 ENCOUNTER — Ambulatory Visit (INDEPENDENT_AMBULATORY_CARE_PROVIDER_SITE_OTHER): Payer: Medicare Other | Admitting: Urology

## 2020-11-26 ENCOUNTER — Other Ambulatory Visit: Payer: Self-pay

## 2020-11-26 VITALS — BP 116/76 | HR 93 | Ht 60.0 in | Wt 152.0 lb

## 2020-11-26 DIAGNOSIS — Z8551 Personal history of malignant neoplasm of bladder: Secondary | ICD-10-CM | POA: Diagnosis not present

## 2020-11-26 DIAGNOSIS — R310 Gross hematuria: Secondary | ICD-10-CM

## 2020-11-26 LAB — URINALYSIS, COMPLETE
Bilirubin, UA: NEGATIVE
Glucose, UA: NEGATIVE
Ketones, UA: NEGATIVE
Nitrite, UA: NEGATIVE
Protein,UA: NEGATIVE
RBC, UA: NEGATIVE
Specific Gravity, UA: 1.01 (ref 1.005–1.030)
Urobilinogen, Ur: 0.2 mg/dL (ref 0.2–1.0)
pH, UA: 6.5 (ref 5.0–7.5)

## 2020-11-26 LAB — MICROSCOPIC EXAMINATION
Epithelial Cells (non renal): >10 /hpf — ABNORMAL HIGH (ref 0–10)
RBC, Urine: NONE SEEN /hpf (ref 0–2)

## 2020-11-26 MED ORDER — ESTRADIOL 0.1 MG/GM VA CREA: TOPICAL_CREAM | VAGINAL | 12 refills | Status: DC

## 2020-12-04 DIAGNOSIS — C9001 Multiple myeloma in remission: Secondary | ICD-10-CM | POA: Diagnosis not present

## 2020-12-04 DIAGNOSIS — N1832 Chronic kidney disease, stage 3b: Secondary | ICD-10-CM | POA: Diagnosis not present

## 2020-12-04 DIAGNOSIS — I10 Essential (primary) hypertension: Secondary | ICD-10-CM | POA: Diagnosis not present

## 2020-12-04 DIAGNOSIS — D631 Anemia in chronic kidney disease: Secondary | ICD-10-CM | POA: Diagnosis not present

## 2020-12-04 DIAGNOSIS — R809 Proteinuria, unspecified: Secondary | ICD-10-CM | POA: Diagnosis not present

## 2020-12-04 DIAGNOSIS — N2581 Secondary hyperparathyroidism of renal origin: Secondary | ICD-10-CM | POA: Diagnosis not present

## 2020-12-16 ENCOUNTER — Telehealth: Payer: Self-pay | Admitting: *Deleted

## 2020-12-16 NOTE — Telephone Encounter (Signed)
Patient nurse called in today to make sure she can use the cream with cloudy urine. Patient can still use the cream . Advised them to get the renal u/s done . States she has cloudy urine. No burning. Patient may has an appt to check her urine. Will call back

## 2020-12-24 ENCOUNTER — Ambulatory Visit
Admission: RE | Admit: 2020-12-24 | Discharge: 2020-12-24 | Disposition: A | Discharge status: Home / Self Care | Payer: Medicare Other | Source: Ambulatory Visit | Attending: Urology | Admitting: Urology

## 2020-12-24 ENCOUNTER — Other Ambulatory Visit: Payer: Self-pay

## 2020-12-24 DIAGNOSIS — R319 Hematuria, unspecified: Secondary | ICD-10-CM | POA: Diagnosis not present

## 2020-12-24 DIAGNOSIS — Z8551 Personal history of malignant neoplasm of bladder: Secondary | ICD-10-CM | POA: Insufficient documentation

## 2020-12-24 DIAGNOSIS — R310 Gross hematuria: Secondary | ICD-10-CM | POA: Diagnosis not present

## 2020-12-25 ENCOUNTER — Ambulatory Visit: Admission: RE | Admit: 2020-12-25 | Payer: Medicare Other | Source: Ambulatory Visit

## 2020-12-30 NOTE — Progress Notes (Signed)
12/31/2020 9:34 AM   Maicie Crooke Feb 21, 1935 [id number]  Referring provider: Jodi Marble, MD [place],  [place] [address]  Chief Complaint  Patient presents with   Results   Urological history: 1. Bladder cancer -was told her cancer was stage 2 bladder cancer, dx and treated in [place].  It is unclear whether this was squamous or TCC -dx 19 years ago followed by 6 weeks BCG -last surveillance cystoscopy 11/26/2020-NED  2. rUTI's -contributing factors of age and vaginal atrophy -documented positive urine cultures over the last year  11/11/2020 ESBL E.coli  12/04/2020 ESBL E.coli  3. Cystocele -bladder lift ~ 30 years ago   HPI: Jacquelin Krajewski is a 86 y.o. female who presents today for a one month follow for RUS report.  12/25/2020 RUS normal.   She is applying the vaginal estrogen cream three nights weekly.    She is having episodes of cloudy urine, a few spots of blood in the urine and a discomfort at the end of urination.  Patient denies any modifying or aggravating factors.  Patient denies any suprapubic/flank pain.  Patient denies any fevers, chills, nausea or vomiting.        PMH: Past Medical History:  Diagnosis Date   Arthritis    Bladder cancer ([place])    Bladder cancer ([place])    Cancer ([place])    multiple myeloma   Chronic kidney disease    Chronic kidney disease    Dizziness    GERD (gastroesophageal reflux disease)    Hypertension    Multiple myeloma ([place])    Stroke ([place])    tia x 2   Tubular adenoma of colon     Surgical History: Past Surgical History:  Procedure Laterality Date   bladder tumor removed     BREAST EXCISIONAL BIOPSY Bilateral 1970   Benign   BREAST SURGERY     bx   CATARACT EXTRACTION W/PHACO Left 12/16/2016   Procedure: CATARACT EXTRACTION PHACO AND INTRAOCULAR LENS PLACEMENT ([place]);  Surgeon: Eulogio Bear, MD;  Location: [facility] ORS;  Service: Ophthalmology;  Laterality: Left;  [place] 00:38.0 AP%  14.6 CDE 5.54 Fluid pack lot # [id number]   CATARACT EXTRACTION W/PHACO Right 03/03/2017   Procedure: CATARACT EXTRACTION PHACO AND INTRAOCULAR LENS PLACEMENT ([facility]);  Surgeon: Eulogio Bear, MD;  Location: [facility] ORS;  Service: Ophthalmology;  Laterality: Right;  Lot # [id number] H [place]: 00:29.8 AP%: 10.1 CDE: 3.01   COLONOSCOPY WITH PROPOFOL N/A 07/25/2015   Procedure: COLONOSCOPY WITH PROPOFOL;  Surgeon: Josefine Class, MD;  Location[facility] ENDOSCOPY;  Service: Endoscopy;  Laterality: N/A;   COLONOSCOPY WITH PROPOFOL N/A 06/29/2017   Procedure: COLONOSCOPY WITH PROPOFOL;  Surgeon: Toledo, Benay Pike, MD;  Location: [facility] ENDOSCOPY;  Service: Gastroenterology;  Laterality: N/A;   CORONARY ANGIOGRAPHY N/A 05/24/2019   Procedure: CORONARY ANGIOGRAPHY;  Surgeon: Dionisio David, MD;  Location: [place] CV LAB;  Service: Cardiovascular;  Laterality: N/A;   ESOPHAGOGASTRODUODENOSCOPY (EGD) WITH PROPOFOL N/A 07/25/2015   Procedure: ESOPHAGOGASTRODUODENOSCOPY (EGD) WITH PROPOFOL;  Surgeon: Josefine Class, MD;  Location: [facility] ENDOSCOPY;  Service: Endoscopy;  Laterality: N/A;   ESOPHAGOGASTRODUODENOSCOPY (EGD) WITH PROPOFOL N/A 06/29/2017   Procedure: ESOPHAGOGASTRODUODENOSCOPY (EGD) WITH PROPOFOL;  Surgeon: Toledo, Benay Pike, MD;  Location: [facility] ENDOSCOPY;  Service: Gastroenterology;  Laterality: N/A;   HERNIA REPAIR     LEFT HEART CATH N/A 05/24/2019   Procedure: Left Heart Cath;  Surgeon: Dionisio David, MD;  Location: [place] CV LAB;  Service:  Cardiovascular;  Laterality: N/A;    Home Medications:  Allergies as of 12/31/2020       Reactions   Iodine    Seizure Betadine ok Other reaction(s): Other (See Comments) Sneezing Seizure Betadine ok   Sucralfate Rash   Penicillins Other (See Comments), Rash   Has patient had a PCN reaction causing immediate rash, facial/tongue/throat swelling, SOB or lightheadedness with hypotension: Unknown Has patient had a PCN reaction causing severe  rash involving mucus membranes or skin necrosis: Unknown Has patient had a PCN reaction that required hospitalization: Unknown Has patient had a PCN reaction occurring within the last 10 years: Unknown If all of the above answers are "NO", then may proceed with Cephalosporin use. Other reaction(s): Other (See Comments) Has patient had a PCN reaction causing immediate rash, facial/tongue/throat swelling, SOB or lightheadedness with hypotension: Unknown Has patient had a PCN reaction causing severe rash involving mucus membranes or skin necrosis: Unknown Has patient had a PCN reaction that required hospitalization: Unknown Has patient had a PCN reaction occurring within the last 10 years: Unknown If all of the above answers are "NO", then may proceed with Cephalosporin use. Has patient had a PCN reaction causing immediate rash, facial/tongue/throat swelling, SOB or lightheadedness with hypotension: Unknown Has patient had a PCN reaction causing severe rash involving mucus membranes or skin necrosis: Unknown Has patient had a PCN reaction that required hospitalization: Unknown Has patient had a PCN reaction occurring within the last 10 years: Unknown If all of the above answers are "NO", then may proceed with Cephalosporin use.        Medication List        Accurate as of December 31, 2020  9:34 AM. If you have any questions, ask your nurse or doctor.          Centrum Silver 50+Women Tabs Take 1 tablet by mouth daily.   estradiol 0.1 MG/GM vaginal cream Commonly known as: ESTRACE Estrogen Cream Instruction Discard applicator Apply pea sized amount to tip of finger to urethra before bed. Wash hands well after application. Use Monday, Wednesday and Friday   Fish Oil 1000 MG Caps Take 1 capsule by mouth daily.   losartan 25 MG tablet Commonly known as: COZAAR Take 25 mg by mouth daily.   meclizine 25 MG tablet Commonly known as: ANTIVERT Take 25 mg by mouth 3 (three) times  daily as needed.   metoprolol succinate 25 MG 24 hr tablet Commonly known as: TOPROL-XL Take 50 mg by mouth daily.   nitroGLYCERIN 0.3 MG SL tablet Commonly known as: NITROSTAT Place 1 tablet under the tongue as needed.   pantoprazole 40 MG tablet Commonly known as: PROTONIX Take 40 mg by mouth 2 (two) times daily.   ranolazine 500 MG 12 hr tablet Commonly known as: RANEXA Take 500 mg by mouth 2 (two) times daily.   rOPINIRole 0.25 MG tablet Commonly known as: REQUIP Take 1 tablet by mouth as needed.   rosuvastatin 10 MG tablet Commonly known as: CRESTOR Take 10 mg by mouth daily.        Allergies:  Allergies  Allergen Reactions   Iodine     Seizure  Betadine ok Other reaction(s): Other (See Comments) Sneezing Seizure  Betadine ok   Sucralfate Rash   Penicillins Other (See Comments) and Rash    Has patient had a PCN reaction causing immediate rash, facial/tongue/throat swelling, SOB or lightheadedness with hypotension: Unknown Has patient had a PCN reaction causing severe rash involving mucus membranes  or skin necrosis: Unknown Has patient had a PCN reaction that required hospitalization: Unknown Has patient had a PCN reaction occurring within the last 10 years: Unknown If all of the above answers are "NO", then may proceed with Cephalosporin use.  Other reaction(s): Other (See Comments) Has patient had a PCN reaction causing immediate rash, facial/tongue/throat swelling, SOB or lightheadedness with hypotension: Unknown Has patient had a PCN reaction causing severe rash involving mucus membranes or skin necrosis: Unknown Has patient had a PCN reaction that required hospitalization: Unknown Has patient had a PCN reaction occurring within the last 10 years: Unknown If all of the above answers are "NO", then may proceed with Cephalosporin use. Has patient had a PCN reaction causing immediate rash, facial/tongue/throat swelling, SOB or lightheadedness with  hypotension: Unknown Has patient had a PCN reaction causing severe rash involving mucus membranes or skin necrosis: Unknown Has patient had a PCN reaction that required hospitalization: Unknown Has patient had a PCN reaction occurring within the last 10 years: Unknown If all of the above answers are "NO", then may proceed with Cephalosporin use.    Family History: Family History  Problem Relation Age of Onset   Cancer Mother    Colon cancer Mother    Colon polyps Mother    Cancer Sister    Breast cancer Sister 51   Cancer Brother    Bladder Cancer Neg Hx    Kidney cancer Neg Hx     Social History:  reports that she has quit smoking. She has never used smokeless tobacco. She reports that she does not drink alcohol and does not use drugs.  ROS: Pertinent ROS in HPI  Physical Exam: BP 123/75   Pulse 82   Ht 5' (1.524 m)   Wt 154 lb (69.9 kg)   BMI 30.08 kg/m   Constitutional:  Well nourished. Alert and oriented, No acute distress. HEENT: [place] AT, mask in place.  Trachea midline Cardiovascular: No clubbing, cyanosis, or edema. Respiratory: Normal respiratory effort, no increased work of breathing. Neurologic: Grossly intact, no focal deficits, moving all 4 extremities. Psychiatric: Normal mood and affect.    Laboratory Data: N/A   Pertinent Imaging: Narrative & Impression  CLINICAL DATA:  Hematuria   EXAM: RENAL / URINARY TRACT ULTRASOUND COMPLETE   COMPARISON:  None.   FINDINGS: Right Kidney:   Renal measurements: 8.8 x 4.1 x 3.7 cm = volume: 69 mL. Echogenicity within normal limits. No mass or hydronephrosis visualized.   Left Kidney:   Renal measurements: 9.2 x 4.6 x 4.2 cm = volume: 94 mL. Echogenicity within normal limits. No mass or hydronephrosis visualized.   Bladder:   Appears normal for degree of bladder distention.   Other:   None.   IMPRESSION: Normal bilateral kidneys.     Electronically Signed   By: Yetta Glassman M.D.   On:  12/25/2020 09:09    I have independently reviewed the films.    Assessment & Plan:    1. Bladder cancer -cysto 11/2020 NED -no further screening needed  2. rUTI's - criteria for recurrent UTI has been met with 2 or more infections in 6 months or 3 or greater infections in one year  -We had a discussion regarding her recurrent UTIs and the fact that they are typically positive for ESBL E. coli.  At this time, she is not wanting to start an antibiotic as her symptoms are mild and I am in agreement with her.  We discussed that it was best  to let the body's immune system address the infection as long as she was not having persistent gross hematuria, pain, fever or chills associated with the UTI -She will contact us if her urine symptoms worsen for urinalysis and urine culture -She will continue to take her cranberry tablets, drink copious amount of water and find yogurt with live active cultures and continue to apply the vaginal estrogen cream 3 nights weekly  3. Vaginal atrophy -Encouraged patient to continue with the 3 nights weekly of application of the estrogen cream -I explained it can take up to 8 months to see the physiological changes we need in the vagina to prevent infections in the future                                             Return in about 1 year (around 12/31/2021) for follow up.  These notes generated with voice recognition software. I apologize for typographical errors.  [name], PA-C  [facility] [address]  [place], [place] [address] [phone]

## 2020-12-31 ENCOUNTER — Ambulatory Visit (INDEPENDENT_AMBULATORY_CARE_PROVIDER_SITE_OTHER): Payer: Medicare Other | Admitting: Urology

## 2020-12-31 ENCOUNTER — Encounter: Payer: Self-pay | Admitting: Urology

## 2020-12-31 ENCOUNTER — Other Ambulatory Visit: Payer: Self-pay | Admitting: Internal Medicine

## 2020-12-31 ENCOUNTER — Other Ambulatory Visit: Payer: Self-pay

## 2020-12-31 VITALS — BP 123/75 | HR 82 | Ht 60.0 in | Wt 154.0 lb

## 2020-12-31 DIAGNOSIS — N952 Postmenopausal atrophic vaginitis: Secondary | ICD-10-CM | POA: Diagnosis not present

## 2020-12-31 DIAGNOSIS — Z1231 Encounter for screening mammogram for malignant neoplasm of breast: Secondary | ICD-10-CM

## 2020-12-31 DIAGNOSIS — R809 Proteinuria, unspecified: Secondary | ICD-10-CM | POA: Diagnosis not present

## 2020-12-31 DIAGNOSIS — N184 Chronic kidney disease, stage 4 (severe): Secondary | ICD-10-CM | POA: Diagnosis not present

## 2020-12-31 DIAGNOSIS — N390 Urinary tract infection, site not specified: Secondary | ICD-10-CM

## 2020-12-31 NOTE — Patient Instructions (Signed)
Look for yogurt's that have the ingredients "Live Active Cultures" on the container.  If it doesn't, get a brand that does or take pro-biotic pills.

## 2021-01-14 DIAGNOSIS — K219 Gastro-esophageal reflux disease without esophagitis: Secondary | ICD-10-CM | POA: Insufficient documentation

## 2021-01-14 DIAGNOSIS — C9000 Multiple myeloma not having achieved remission: Secondary | ICD-10-CM | POA: Diagnosis not present

## 2021-01-14 DIAGNOSIS — I2510 Atherosclerotic heart disease of native coronary artery without angina pectoris: Secondary | ICD-10-CM | POA: Diagnosis not present

## 2021-01-14 DIAGNOSIS — I10 Essential (primary) hypertension: Secondary | ICD-10-CM | POA: Diagnosis not present

## 2021-01-14 DIAGNOSIS — G2581 Restless legs syndrome: Secondary | ICD-10-CM | POA: Diagnosis not present

## 2021-01-15 ENCOUNTER — Other Ambulatory Visit: Payer: Self-pay

## 2021-01-15 ENCOUNTER — Ambulatory Visit
Admission: RE | Admit: 2021-01-15 | Discharge: 2021-01-15 | Disposition: A | Discharge status: Home / Self Care | Payer: Medicare Other | Source: Ambulatory Visit | Attending: Internal Medicine | Admitting: Internal Medicine

## 2021-01-15 DIAGNOSIS — Z1231 Encounter for screening mammogram for malignant neoplasm of breast: Secondary | ICD-10-CM | POA: Diagnosis not present

## 2021-02-06 DIAGNOSIS — R202 Paresthesia of skin: Secondary | ICD-10-CM | POA: Diagnosis not present

## 2021-02-06 DIAGNOSIS — K625 Hemorrhage of anus and rectum: Secondary | ICD-10-CM | POA: Diagnosis not present

## 2021-02-07 ENCOUNTER — Emergency Department
Admission: EM | Admit: 2021-02-07 | Discharge: 2021-02-07 | Disposition: A | Discharge status: Home / Self Care | Payer: Medicare Other | Attending: Emergency Medicine | Admitting: Emergency Medicine

## 2021-02-07 ENCOUNTER — Other Ambulatory Visit: Payer: Self-pay

## 2021-02-07 DIAGNOSIS — H019 Unspecified inflammation of eyelid: Secondary | ICD-10-CM | POA: Insufficient documentation

## 2021-02-07 DIAGNOSIS — H01131 Eczematous dermatitis of right upper eyelid: Secondary | ICD-10-CM | POA: Diagnosis not present

## 2021-02-07 DIAGNOSIS — H01134 Eczematous dermatitis of left upper eyelid: Secondary | ICD-10-CM | POA: Insufficient documentation

## 2021-02-07 DIAGNOSIS — H5713 Ocular pain, bilateral: Secondary | ICD-10-CM | POA: Diagnosis present

## 2021-02-07 DIAGNOSIS — Z79899 Other long term (current) drug therapy: Secondary | ICD-10-CM | POA: Diagnosis not present

## 2021-02-07 DIAGNOSIS — Z8551 Personal history of malignant neoplasm of bladder: Secondary | ICD-10-CM | POA: Diagnosis not present

## 2021-02-07 DIAGNOSIS — N183 Chronic kidney disease, stage 3 unspecified: Secondary | ICD-10-CM | POA: Diagnosis not present

## 2021-02-07 DIAGNOSIS — I129 Hypertensive chronic kidney disease with stage 1 through stage 4 chronic kidney disease, or unspecified chronic kidney disease: Secondary | ICD-10-CM | POA: Diagnosis not present

## 2021-02-07 DIAGNOSIS — Z87891 Personal history of nicotine dependence: Secondary | ICD-10-CM | POA: Insufficient documentation

## 2021-02-07 MED ORDER — HYDROCORTISONE 1 % EX LOTN: TOPICAL_LOTION | CUTANEOUS | 0 refills | Status: DC

## 2021-02-07 MED ORDER — CETIRIZINE HCL 5 MG PO TABS: 10.0000 mg | ORAL_TABLET | Freq: Every day | ORAL | 0 refills | Status: DC

## 2021-02-07 NOTE — ED Provider Notes (Signed)
[facility] Emergency Department Provider Note   ____________________________________________   Event Date/Time   First MD Initiated Contact with Patient 02/07/21 1328     (approximate)  I have reviewed the triage vital signs and the nursing notes.   HISTORY  Chief Complaint Eye Pain    HPI Julie Khan is a 86 y.o. female presents to the ED with complaint of skin irritation to her upper eyelids.  Patient states this been going on for several months but this morning her left eyelid seem to be worse.  She reports itching just to the area.  There is no change in her vision except for her eyelid drooping to the left eye.  No drainage or discharge from this area.  She denies any fever or chills.  Patient has used some topical oil to her skin once which did not seem to help.       Past Medical History:  Diagnosis Date   Arthritis    Bladder cancer ([place])    Bladder cancer ([place])    Cancer ([place])    multiple myeloma   Chronic kidney disease    Chronic kidney disease    Dizziness    GERD (gastroesophageal reflux disease)    Hypertension    Multiple myeloma ([place])    Stroke ([place])    tia x 2   Tubular adenoma of colon     Patient Active Problem List   Diagnosis Date Noted   Epigastric pain 05/24/2019   Elevated troponin 05/24/2019   Benign essential hypertension 03/06/2019   Stage 3 chronic kidney disease ([place]) 03/06/2019   Leg cramping 11/04/2017   Tingling 11/04/2017   IDA (iron deficiency anemia) 10/05/2017   Hypertension 01/11/2017   Dizziness 01/11/2017   Carotid stenosis 01/11/2017   Bladder cancer ([place]) 06/29/2016   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   Chondrocalcinosis of knee 12/19/2014   Osteoarthritis of knee 12/19/2014    Past Surgical History:  Procedure Laterality Date   bladder tumor removed     BREAST EXCISIONAL BIOPSY Bilateral 1970   Benign   BREAST SURGERY     bx   CATARACT EXTRACTION W/PHACO Left 12/16/2016   Procedure:  CATARACT EXTRACTION PHACO AND INTRAOCULAR LENS PLACEMENT ([place]);  Surgeon: Eulogio Bear, MD;  Location: [facility] ORS;  Service: Ophthalmology;  Laterality: Left;  [place] 00:38.0 AP% 14.6 CDE 5.54 Fluid pack lot # [id number]   CATARACT EXTRACTION W/PHACO Right 03/03/2017   Procedure: CATARACT EXTRACTION PHACO AND INTRAOCULAR LENS PLACEMENT ([facility]);  Surgeon: Eulogio Bear, MD;  Location: [facility] ORS;  Service: Ophthalmology;  Laterality: Right;  Lot # [id number] H [place]: 00:29.8 AP%: 10.1 CDE: 3.01   COLONOSCOPY WITH PROPOFOL N/A 07/25/2015   Procedure: COLONOSCOPY WITH PROPOFOL;  Surgeon: Josefine Class, MD;  Location: [facility] ENDOSCOPY;  Service: Endoscopy;  Laterality: N/A;   COLONOSCOPY WITH PROPOFOL N/A 06/29/2017   Procedure: COLONOSCOPY WITH PROPOFOL;  Surgeon: Toledo, Benay Pike, MD;  Location: [facility] ENDOSCOPY;  Service: Gastroenterology;  Laterality: N/A;   CORONARY ANGIOGRAPHY N/A 05/24/2019   Procedure: CORONARY ANGIOGRAPHY;  Surgeon: Dionisio David, MD;  Location: [place] CV LAB;  Service: Cardiovascular;  Laterality: N/A;   ESOPHAGOGASTRODUODENOSCOPY (EGD) WITH PROPOFOL N/A 07/25/2015   Procedure: ESOPHAGOGASTRODUODENOSCOPY (EGD) WITH PROPOFOL;  Surgeon: Josefine Class, MD;  Location: [facility] ENDOSCOPY;  Service: Endoscopy;  Laterality: N/A;   ESOPHAGOGASTRODUODENOSCOPY (EGD) WITH PROPOFOL N/A 06/29/2017   Procedure: ESOPHAGOGASTRODUODENOSCOPY (EGD) WITH PROPOFOL;  Surgeon: Toledo, Benay Pike, MD;  Location: [facility] ENDOSCOPY;  Service:  Gastroenterology;  Laterality: N/A;   HERNIA REPAIR     LEFT HEART CATH N/A 05/24/2019   Procedure: Left Heart Cath;  Surgeon: Dionisio David, MD;  Location: [place] CV LAB;  Service: Cardiovascular;  Laterality: N/A;    Prior to Admission medications   Medication Sig Start Date End Date Taking? Authorizing Provider  cetirizine (ZYRTEC) 5 MG tablet Take 2 tablets (10 mg total) by mouth daily. 02/07/21 02/07/22 Yes Johnn Hai, PA-C  hydrocortisone  1 % lotion Apply a very small amount to eyelid rash bilaterally bid 02/07/21  Yes Letitia Neri L, PA-C  estradiol (ESTRACE) 0.1 MG/GM vaginal cream Estrogen Cream Instruction Discard applicator Apply pea sized amount to tip of finger to urethra before bed. Wash hands well after application. Use Monday, Wednesday and Friday 11/26/20   Hollice Espy, MD  losartan (COZAAR) 25 MG tablet Take 25 mg by mouth daily.    [provider]  meclizine (ANTIVERT) 25 MG tablet Take 25 mg by mouth 3 (three) times daily as needed.  10/26/17   [provider]  metoprolol succinate (TOPROL-XL) 25 MG 24 hr tablet Take 50 mg by mouth daily.     [provider]  Multiple Vitamins-Minerals (CENTRUM SILVER 50+WOMEN) TABS Take 1 tablet by mouth daily.    [provider]  nitroGLYCERIN (NITROSTAT) 0.3 MG SL tablet Place 1 tablet under the tongue as needed. 05/07/19   [provider]  Omega-3 Fatty Acids (FISH OIL) 1000 MG CAPS Take 1 capsule by mouth daily.    [provider]  pantoprazole (PROTONIX) 40 MG tablet Take 40 mg by mouth 2 (two) times daily. 06/18/20   [provider]  ranolazine (RANEXA) 500 MG 12 hr tablet Take 500 mg by mouth 2 (two) times daily.    [provider]  rOPINIRole (REQUIP) 0.25 MG tablet Take 1 tablet by mouth as needed. 07/20/19   [provider]  rosuvastatin (CRESTOR) 10 MG tablet Take 10 mg by mouth daily. 05/15/19   [provider]    Allergies Iodine, Sucralfate, and Penicillins  Family History  Problem Relation Age of Onset   Cancer Mother    Colon cancer Mother    Colon polyps Mother    Cancer Sister    Breast cancer Sister 78   Cancer Brother    Bladder Cancer Neg Hx    Kidney cancer Neg Hx     Social History Social History   Tobacco Use   Smoking status: Former   Smokeless tobacco: Never  Scientific laboratory technician Use: Never used  Substance Use Topics   Alcohol use: No   Drug use: No     Review of Systems Constitutional: No fever/chills Eyes: No visual changes.Upper eyelid irritation.  No visual changes or drainage. ENT: No sore throat.   Cardiovascular: Denies chest pain. Respiratory: Denies shortness of breath. Gastrointestinal:  No nausea, no vomiting.  No diarrhea.  Musculoskeletal: Negative for back pain. Skin: Positive for skin rash bilateral upper eyelids. Neurological: Negative for headaches, focal weakness or numbness.   ____________________________________________   PHYSICAL EXAM:  VITAL SIGNS: ED Triage Vitals  Enc Vitals Group     BP 02/07/21 1247 (!) 154/91     Pulse Rate 02/07/21 1247 72     Resp 02/07/21 1247 20     Temp 02/07/21 1247 97.7 F (36.5 C)     Temp Source 02/07/21 1247 Oral     SpO2 02/07/21 1247 96 %  Weight 02/07/21 1244 150 lb (68 kg)     Height 02/07/21 1244 5' (1.524 m)     Head Circumference --      Peak Flow --      Pain Score 02/07/21 1244 0     Pain Loc --      Pain Edu? --      Excl. in [place]? --     Constitutional: Alert and oriented. Well appearing and in no acute distress. Eyes: Conjunctivae are normal. PERRL. EOMI. Head: Atraumatic. Nose: No congestion/rhinnorhea. Mouth/Throat: Mucous membranes are moist.   Neck: No stridor.   Cardiovascular: Normal rate, regular rhythm. Grossly normal heart sounds.  Good peripheral circulation. Respiratory: Normal respiratory effort.  No retractions. Lungs CTAB. Musculoskeletal: Moves upper and lower extremities that any difficulty and normal gait was noted.  Patient is ambulatory without any assistance. Neurologic:  Normal speech and language. No gross focal neurologic deficits are appreciated. No gait instability. Skin: Skin to the upper lids bilaterally are moderately dry and scaly to touch.  Minimal erythema and no drainage or vesicles are noted.  Nontender to touch.  Left eye lid is more affected than the right at this time. Psychiatric: Mood and affect are normal.  Speech and behavior are normal.  __________________________________________ __________________________________________   INITIAL IMPRESSION / ASSESSMENT AND PLAN / ED COURSE  As part of my medical decision making, I reviewed the following data within the electronic MEDICAL RECORD NUMBER Notes from prior ED visits and [place] Controlled Substance Database  86 year old female presents to the ED with complaint of eyelid swelling and itching.  Patient states that she has been experiencing this for several months but this morning her left eyelid seem to be swollen.  She has tried oil to her eyelid once without any improvement.  She denies any visual difficulties or redness to her eye.  No pain is involved.  On exam area is consistent with atopic dermatitis.  I discussed with patient the need for some very mild topical cortisone cream in which she is to use a very small amount to her eyelid twice a day.  We will also place her on Zyrtec 5 mg once daily for itching.  She was made aware that this medication would not cause drowsiness like Benadryl.  She is to follow-up with her PCP however he also was discussed that she should follow-up with [place] or [place] dermatology if this is not improving.   ____________________________________________   FINAL CLINICAL IMPRESSION(S) / ED DIAGNOSES  Final diagnoses:  Eczematous dermatitis of upper eyelids of both eyes     ED Discharge Orders          Ordered    hydrocortisone 1 % lotion        02/07/21 1350    cetirizine (ZYRTEC) 5 MG tablet  Daily        02/07/21 1350             Note:  This document was prepared using Dragon voice recognition software and may include unintentional dictation errors.    Johnn Hai, PA-C 02/07/21 1400    Lucrezia Starch, MD 02/07/21 [phone]

## 2021-02-07 NOTE — Discharge Instructions (Signed)
Follow-up with your primary care provider if any continued problems or concerns.  If the rash to your upper eyelids is not improving with the cream and oral medication you should call make an appointment either with [facility] dermatology or [facility] for further evaluation and other treatment.

## 2021-02-07 NOTE — ED Provider Notes (Signed)
Emergency Medicine Provider Triage Evaluation Note  Julie Khan , a 86 y.o. female  was evaluated in triage.  Pt complains of left eye swelling for a couple of months but worse today.  No pain.  Vision has not changed but lid prevents her from seeing. Patient complains lid itching.  She has used oil to area once without any improvement.  No fever, chills, nausea or vomiting.  No injury known  Review of Systems  Positive: Left eye lid swollen Negative: Denies history of DM  Physical Exam  There were no vitals taken for this visit. Gen:   Awake, no distress  able to answer questions without difficulty.   Resp:  Normal effort, clear bilaterally.  Heart RRR. MSK:   Moves extremities without difficulty  Other:  Skin is erythematous at left upper eye lid without lesions or vesicles.  Medical Decision Making  Medically screening exam initiated at 12:45 PM.  Appropriate orders placed.  Julie Khan was informed that the remainder of the evaluation will be completed by another provider, this initial triage assessment does not replace that evaluation, and the importance of remaining in the ED until their evaluation is complete.     Johnn Hai, PA-C 02/07/21 1257    Lucrezia Starch, MD 02/07/21 [phone]

## 2021-02-07 NOTE — ED Triage Notes (Signed)
Pt via POV from home. Pt states that she has been having problems with her L eye for couple of months but this AM when she woke up her L eye is swollen and itching. Denies pain. Pt is A&OX4

## 2021-02-09 DIAGNOSIS — H01111 Allergic dermatitis of right upper eyelid: Secondary | ICD-10-CM | POA: Diagnosis not present

## 2021-02-10 DIAGNOSIS — K625 Hemorrhage of anus and rectum: Secondary | ICD-10-CM | POA: Diagnosis not present

## 2021-02-16 DIAGNOSIS — N2581 Secondary hyperparathyroidism of renal origin: Secondary | ICD-10-CM | POA: Diagnosis not present

## 2021-02-16 DIAGNOSIS — D631 Anemia in chronic kidney disease: Secondary | ICD-10-CM | POA: Diagnosis not present

## 2021-02-16 DIAGNOSIS — R809 Proteinuria, unspecified: Secondary | ICD-10-CM | POA: Diagnosis not present

## 2021-02-16 DIAGNOSIS — I10 Essential (primary) hypertension: Secondary | ICD-10-CM | POA: Diagnosis not present

## 2021-02-16 DIAGNOSIS — N184 Chronic kidney disease, stage 4 (severe): Secondary | ICD-10-CM | POA: Diagnosis not present

## 2021-02-20 DIAGNOSIS — R112 Nausea with vomiting, unspecified: Secondary | ICD-10-CM | POA: Diagnosis not present

## 2021-03-04 ENCOUNTER — Inpatient Hospital Stay: Payer: Medicare Other | Attending: Oncology

## 2021-03-04 ENCOUNTER — Other Ambulatory Visit: Payer: Self-pay

## 2021-03-04 DIAGNOSIS — D472 Monoclonal gammopathy: Secondary | ICD-10-CM

## 2021-03-04 DIAGNOSIS — C9000 Multiple myeloma not having achieved remission: Secondary | ICD-10-CM | POA: Diagnosis not present

## 2021-03-04 LAB — COMPREHENSIVE METABOLIC PANEL
ALT: 17 U/L (ref 0–44)
AST: 30 U/L (ref 15–41)
Albumin: 3.7 g/dL (ref 3.5–5.0)
Alkaline Phosphatase: 95 U/L (ref 38–126)
Anion gap: 8 (ref 5–15)
BUN: 36 mg/dL — ABNORMAL HIGH (ref 8–23)
CO2: 26 mmol/L (ref 22–32)
Calcium: 9.2 mg/dL (ref 8.9–10.3)
Chloride: 98 mmol/L (ref 98–111)
Creatinine, Ser: 1.72 mg/dL — ABNORMAL HIGH (ref 0.44–1.00)
GFR, Estimated: 29 mL/min — ABNORMAL LOW (ref >60)
Glucose, Bld: 110 mg/dL — ABNORMAL HIGH (ref 70–99)
Potassium: 4 mmol/L (ref 3.5–5.1)
Sodium: 132 mmol/L — ABNORMAL LOW (ref 135–145)
Total Bilirubin: 0.5 mg/dL (ref 0.3–1.2)
Total Protein: 9.7 g/dL — ABNORMAL HIGH (ref 6.5–8.1)

## 2021-03-04 LAB — CBC WITH DIFFERENTIAL/PLATELET
Abs Immature Granulocytes: 0.01 10*3/uL (ref 0.00–0.07)
Basophils Absolute: 0 10*3/uL (ref 0.0–0.1)
Basophils Relative: 1 %
Eosinophils Absolute: 0 10*3/uL (ref 0.0–0.5)
Eosinophils Relative: 1 %
HCT: 32.3 % — ABNORMAL LOW (ref 36.0–46.0)
Hemoglobin: 10.3 g/dL — ABNORMAL LOW (ref 12.0–15.0)
Immature Granulocytes: 0 %
Lymphocytes Relative: 31 %
Lymphs Abs: 1.6 10*3/uL (ref 0.7–4.0)
MCH: 32.1 pg (ref 26.0–34.0)
MCHC: 31.9 g/dL (ref 30.0–36.0)
MCV: 100.6 fL — ABNORMAL HIGH (ref 80.0–100.0)
Monocytes Absolute: 0.4 10*3/uL (ref 0.1–1.0)
Monocytes Relative: 8 %
Neutro Abs: 3.1 10*3/uL (ref 1.7–7.7)
Neutrophils Relative %: 59 %
Platelets: 282 10*3/uL (ref 150–400)
RBC: 3.21 MIL/uL — ABNORMAL LOW (ref 3.87–5.11)
RDW: 13.3 % (ref 11.5–15.5)
WBC: 5.1 10*3/uL (ref 4.0–10.5)
nRBC: 0 % (ref 0.0–0.2)

## 2021-03-05 LAB — IGG, IGA, IGM
IgA: 70 mg/dL (ref 64–422)
IgG (Immunoglobin G), Serum: 4369 mg/dL — ABNORMAL HIGH (ref 586–1602)
IgM (Immunoglobulin M), Srm: 97 mg/dL (ref 26–217)

## 2021-03-05 LAB — KAPPA/LAMBDA LIGHT CHAINS
Kappa free light chain: 269 mg/L — ABNORMAL HIGH (ref 3.3–19.4)
Kappa, lambda light chain ratio: 21.87 — ABNORMAL HIGH (ref 0.26–1.65)
Lambda free light chains: 12.3 mg/L (ref 5.7–26.3)

## 2021-03-06 NOTE — Progress Notes (Signed)
[place]  [phone] [phone]  ID: Julie Khan OB: [birth year]  MR#: [id number]  ITG#:[id number]  Patient Care Team: Jodi Marble, MD as PCP - General (Internal Medicine)   CHIEF COMPLAINT: Smoldering myeloma  INTERVAL HISTORY: Patient returns to clinic today for repeat laboratory work and further evaluation.  She currently feels well and is asymptomatic.  She does not complain of any weakness or fatigue today.  She has no neurologic complaints.  She denies any bony pain.  She denies any recent fevers or illnesses.  She has a good appetite and denies weight loss.  She denies any chest pain, shortness of breath, cough, or hemoptysis.  She denies any nausea, vomiting, constipation, or diarrhea.  She has no urinary complaints.  Patient offers no specific complaints today.  REVIEW OF SYSTEMS:   Review of Systems  Constitutional: Negative.  Negative for fever, malaise/fatigue and weight loss.  Respiratory: Negative.  Negative for cough and shortness of breath.   Cardiovascular: Negative.  Negative for chest pain and leg swelling.  Gastrointestinal: Negative.  Negative for abdominal pain, blood in stool and melena.  Genitourinary: Negative.  Negative for dysuria.  Musculoskeletal: Negative.  Negative for back pain.  Skin: Negative.  Negative for rash.  Neurological: Negative.  Negative for sensory change, focal weakness and weakness.  Psychiatric/Behavioral: Negative.  The patient is not nervous/anxious.    As per HPI. Otherwise, a complete review of systems is negative.  PAST MEDICAL HISTORY: Past Medical History:  Diagnosis Date   Arthritis    Bladder cancer ([place])    Bladder cancer ([place])    Cancer ([place])    multiple myeloma   Chronic kidney disease    Chronic kidney disease    Dizziness    GERD (gastroesophageal reflux disease)    Hypertension    Multiple myeloma ([place])    Stroke ([place])    tia x 2   Tubular adenoma of colon      PAST SURGICAL HISTORY: Past Surgical History:  Procedure Laterality Date   bladder tumor removed     BREAST EXCISIONAL BIOPSY Bilateral 1970   Benign   BREAST SURGERY     bx   CATARACT EXTRACTION W/PHACO Left 12/16/2016   Procedure: CATARACT EXTRACTION PHACO AND INTRAOCULAR LENS PLACEMENT ([place]);  Surgeon: Eulogio Bear, MD;  Location: [facility] ORS;  Service: Ophthalmology;  Laterality: Left;  [place] 00:38.0 AP% 14.6 CDE 5.54 Fluid pack lot # [id number]   CATARACT EXTRACTION W/PHACO Right 03/03/2017   Procedure: CATARACT EXTRACTION PHACO AND INTRAOCULAR LENS PLACEMENT ([facility]);  Surgeon: Eulogio Bear, MD;  Location: [facility] ORS;  Service: Ophthalmology;  Laterality: Right;  Lot # [id number] [place]: 00:29.8 AP%: 10.1 CDE: 3.01   COLONOSCOPY WITH PROPOFOL N/A 07/25/2015   Procedure: COLONOSCOPY WITH PROPOFOL;  Surgeon: Josefine Class, MD;  Location: [facility] ENDOSCOPY;  Service: Endoscopy;  Laterality: N/A;   COLONOSCOPY WITH PROPOFOL N/A 06/29/2017   Procedure: COLONOSCOPY WITH PROPOFOL;  Surgeon: Toledo, Benay Pike, MD;  Location: [facility] ENDOSCOPY;  Service: Gastroenterology;  Laterality: N/A;   CORONARY ANGIOGRAPHY N/A 05/24/2019   Procedure: CORONARY ANGIOGRAPHY;  Surgeon: Dionisio David, MD;  Location: [place] CV LAB;  Service: Cardiovascular;  Laterality: N/A;   ESOPHAGOGASTRODUODENOSCOPY (EGD) WITH PROPOFOL N/A 07/25/2015   Procedure: ESOPHAGOGASTRODUODENOSCOPY (EGD) WITH PROPOFOL;  Surgeon: Josefine Class, MD;  Location: [facility] ENDOSCOPY;  Service: Endoscopy;  Laterality: N/A;   ESOPHAGOGASTRODUODENOSCOPY (EGD) WITH PROPOFOL N/A 06/29/2017   Procedure: ESOPHAGOGASTRODUODENOSCOPY (EGD) WITH PROPOFOL;  Surgeon: Toledo, Benay Pike, MD;  Location: [facility] ENDOSCOPY;  Service: Gastroenterology;  Laterality: N/A;   HERNIA REPAIR     LEFT HEART CATH N/A 05/24/2019   Procedure: Left Heart Cath;  Surgeon: Dionisio David, MD;  Location: [place] CV LAB;  Service: Cardiovascular;  Laterality:  N/A;    FAMILY HISTORY: Family History  Problem Relation Age of Onset   Cancer Mother    Colon cancer Mother    Colon polyps Mother    Cancer Sister    Breast cancer Sister 95   Cancer Brother    Bladder Cancer Neg Hx    Kidney cancer Neg Hx     ADVANCED DIRECTIVES (Y/N):  N  HEALTH MAINTENANCE: Social History   Tobacco Use   Smoking status: Former   Smokeless tobacco: Never  Scientific laboratory technician Use: Never used  Substance Use Topics   Alcohol use: No   Drug use: No     Colonoscopy:  PAP:  Bone density:  Lipid panel:  Allergies  Allergen Reactions   Iodine     Seizure  Betadine ok Other reaction(s): Other (See Comments) Sneezing Seizure  Betadine ok   Sucralfate Rash   Penicillins Other (See Comments) and Rash    Has patient had a PCN reaction causing immediate rash, facial/tongue/throat swelling, SOB or lightheadedness with hypotension: Unknown Has patient had a PCN reaction causing severe rash involving mucus membranes or skin necrosis: Unknown Has patient had a PCN reaction that required hospitalization: Unknown Has patient had a PCN reaction occurring within the last 10 years: Unknown If all of the above answers are "NO", then may proceed with Cephalosporin use.  Other reaction(s): Other (See Comments) Has patient had a PCN reaction causing immediate rash, facial/tongue/throat swelling, SOB or lightheadedness with hypotension: Unknown Has patient had a PCN reaction causing severe rash involving mucus membranes or skin necrosis: Unknown Has patient had a PCN reaction that required hospitalization: Unknown Has patient had a PCN reaction occurring within the last 10 years: Unknown If all of the above answers are "NO", then may proceed with Cephalosporin use. Has patient had a PCN reaction causing immediate rash, facial/tongue/throat swelling, SOB or lightheadedness with hypotension: Unknown Has patient had a PCN reaction causing severe rash involving  mucus membranes or skin necrosis: Unknown Has patient had a PCN reaction that required hospitalization: Unknown Has patient had a PCN reaction occurring within the last 10 years: Unknown If all of the above answers are "NO", then may proceed with Cephalosporin use.    Current Outpatient Medications  Medication Sig Dispense Refill   estradiol (ESTRACE) 0.1 MG/GM vaginal cream Estrogen Cream Instruction Discard applicator Apply pea sized amount to tip of finger to urethra before bed. Wash hands well after application. Use Monday, Wednesday and Friday 42.5 g 12   hydrocortisone 1 % lotion Apply a very small amount to eyelid rash bilaterally bid 118 mL 0   losartan (COZAAR) 25 MG tablet Take 25 mg by mouth daily.     metoprolol succinate (TOPROL-XL) 25 MG 24 hr tablet Take 50 mg by mouth daily.      Multiple Vitamins-Minerals (CENTRUM SILVER 50+WOMEN) TABS Take 1 tablet by mouth daily.     nitroGLYCERIN (NITROSTAT) 0.3 MG SL tablet Place 1 tablet under the tongue as needed.     Omega-3 Fatty Acids (FISH OIL) 1000 MG CAPS Take 1 capsule by mouth daily.     pantoprazole (PROTONIX) 40 MG tablet Take by mouth.  ranolazine (RANEXA) 500 MG 12 hr tablet Take 500 mg by mouth 2 (two) times daily.     rOPINIRole (REQUIP) 0.25 MG tablet Take 1 tablet by mouth as needed.     rosuvastatin (CRESTOR) 10 MG tablet Take 10 mg by mouth daily.     cetirizine (ZYRTEC) 5 MG tablet Take 2 tablets (10 mg total) by mouth daily. (Patient not taking: Reported on 03/12/2021) 30 tablet 0   desonide (DESOWEN) 0.05 % cream SMARTSIG:Sparingly Topical Twice Daily (Patient not taking: Reported on 03/12/2021)     meclizine (ANTIVERT) 25 MG tablet Take 25 mg by mouth 3 (three) times daily as needed.  (Patient not taking: Reported on 03/12/2021)  1   ondansetron (ZOFRAN-ODT) 4 MG disintegrating tablet Take 4 mg by mouth every 8 (eight) hours as needed. (Patient not taking: Reported on 03/12/2021)     No current facility-administered  medications for this visit.   Facility-Administered Medications Ordered in Other Visits  Medication Dose Route Frequency Provider Last Rate Last Admin   sodium chloride flush (NS) 0.9 % injection 3 mL  3 mL Intravenous Q12H Neoma Laming A, MD        OBJECTIVE: Vitals:   03/12/21 1402  BP: 139/83  Pulse: 76  Resp: 18  Temp: (!) 97.2 F (36.2 C)  SpO2: 100%     Body mass index is 29.88 kg/m.    ECOG FS:0 - Asymptomatic  General: Well-developed, well-nourished, no acute distress. Eyes: Pink conjunctiva, anicteric sclera. HEENT: Normocephalic, moist mucous membranes. Lungs: No audible wheezing or coughing. Heart: Regular rate and rhythm. Abdomen: Soft, nontender, no obvious distention. Musculoskeletal: No edema, cyanosis, or clubbing. Neuro: Alert, answering all questions appropriately. Cranial nerves grossly intact. Skin: No rashes or petechiae noted. Psych: Normal affect.   LAB RESULTS:  Lab Results  Component Value Date   NA 132 (L) 03/04/2021   K 4.0 03/04/2021   CL 98 03/04/2021   CO2 26 03/04/2021   GLUCOSE 110 (H) 03/04/2021   BUN 36 (H) 03/04/2021   CREATININE 1.72 (H) 03/04/2021   CALCIUM 9.2 03/04/2021   PROT 9.7 (H) 03/04/2021   ALBUMIN 3.7 03/04/2021   AST 30 03/04/2021   ALT 17 03/04/2021   ALKPHOS 95 03/04/2021   BILITOT 0.5 03/04/2021   GFRNONAA 29 (L) 03/04/2021   GFRAA 33 (L) 11/19/2019    Lab Results  Component Value Date   WBC 5.1 03/04/2021   NEUTROABS 3.1 03/04/2021   HGB 10.3 (L) 03/04/2021   HCT 32.3 (L) 03/04/2021   MCV 100.6 (H) 03/04/2021   PLT 282 03/04/2021      STUDIES: No results found.  ASSESSMENT: Smoldering myeloma  PLAN:   1. Smoldering myeloma: Bone marrow biopsy on December 23, 2011 revealed 15% plasma cells with cytogenetics having monosomy 13 in approximately 7%.  Repeat bone marrow biopsy on May 19, 2020 was essentially unchanged with approximately 15% plasma cells with monosomy 13 cytogenetics.  Her  most recent skeletal survey on April 10, 2020 reviewed independently with no radiographic sign of multiple myeloma or progressive disease.  Previously, patient's M spike was relatively stable between 2.0 and 2.5.  More recently, her level has been slightly above 3.0.  Today's result was 3.3.  IgG levels also trended up, but now have stabilized just over 4000.  Today's result is 4369.  Kappa free light chains are also elevated at 269.0.  She continues to have mild renal insufficiency and anemia, but these are chronic and unchanged.  No intervention  is needed.  Patient does not require treatment at this time.  Return to clinic in 4 months for repeat laboratory work and further evaluation.   2. Renal insufficiency: Patient's creatinine is elevated, but approximately her baseline at 1.72.  Monitor.  Continue follow-up with nephrology as needed. 3.  Anemia: Chronic and unchanged.  Patient's hemoglobin is 10.3 today.    Lloyd Huger, MD   03/15/2021 6:25 AM

## 2021-03-09 LAB — MULTIPLE MYELOMA PANEL, SERUM
Albumin SerPl Elph-Mcnc: 3.7 g/dL (ref 2.9–4.4)
Albumin/Glob SerPl: 0.7 (ref 0.7–1.7)
Alpha 1: 0.2 g/dL (ref 0.0–0.4)
Alpha2 Glob SerPl Elph-Mcnc: 0.6 g/dL (ref 0.4–1.0)
B-Globulin SerPl Elph-Mcnc: 0.9 g/dL (ref 0.7–1.3)
Gamma Glob SerPl Elph-Mcnc: 3.6 g/dL — ABNORMAL HIGH (ref 0.4–1.8)
Globulin, Total: 5.3 g/dL — ABNORMAL HIGH (ref 2.2–3.9)
IgA: 71 mg/dL (ref 64–422)
IgG (Immunoglobin G), Serum: 4454 mg/dL — ABNORMAL HIGH (ref 586–1602)
IgM (Immunoglobulin M), Srm: 96 mg/dL (ref 26–217)
M Protein SerPl Elph-Mcnc: 3.3 g/dL — ABNORMAL HIGH
Total Protein ELP: 9 g/dL — ABNORMAL HIGH (ref 6.0–8.5)

## 2021-03-12 ENCOUNTER — Inpatient Hospital Stay: Payer: Medicare Other | Attending: Oncology | Admitting: Oncology

## 2021-03-12 ENCOUNTER — Other Ambulatory Visit: Payer: Self-pay

## 2021-03-12 ENCOUNTER — Encounter: Payer: Self-pay | Admitting: Oncology

## 2021-03-12 VITALS — BP 139/83 | HR 76 | Temp 97.2°F | Resp 18 | Wt 153.0 lb

## 2021-03-12 DIAGNOSIS — Z87891 Personal history of nicotine dependence: Secondary | ICD-10-CM | POA: Diagnosis not present

## 2021-03-12 DIAGNOSIS — Z809 Family history of malignant neoplasm, unspecified: Secondary | ICD-10-CM | POA: Diagnosis not present

## 2021-03-12 DIAGNOSIS — Z8673 Personal history of transient ischemic attack (TIA), and cerebral infarction without residual deficits: Secondary | ICD-10-CM | POA: Insufficient documentation

## 2021-03-12 DIAGNOSIS — Z803 Family history of malignant neoplasm of breast: Secondary | ICD-10-CM | POA: Diagnosis not present

## 2021-03-12 DIAGNOSIS — D649 Anemia, unspecified: Secondary | ICD-10-CM | POA: Insufficient documentation

## 2021-03-12 DIAGNOSIS — Z79899 Other long term (current) drug therapy: Secondary | ICD-10-CM | POA: Insufficient documentation

## 2021-03-12 DIAGNOSIS — D472 Monoclonal gammopathy: Secondary | ICD-10-CM

## 2021-03-12 DIAGNOSIS — C9000 Multiple myeloma not having achieved remission: Secondary | ICD-10-CM | POA: Diagnosis not present

## 2021-03-12 DIAGNOSIS — I129 Hypertensive chronic kidney disease with stage 1 through stage 4 chronic kidney disease, or unspecified chronic kidney disease: Secondary | ICD-10-CM | POA: Insufficient documentation

## 2021-03-12 DIAGNOSIS — Z8551 Personal history of malignant neoplasm of bladder: Secondary | ICD-10-CM | POA: Insufficient documentation

## 2021-03-12 DIAGNOSIS — Z86010 Personal history of colonic polyps: Secondary | ICD-10-CM | POA: Insufficient documentation

## 2021-03-12 DIAGNOSIS — K219 Gastro-esophageal reflux disease without esophagitis: Secondary | ICD-10-CM | POA: Insufficient documentation

## 2021-03-12 DIAGNOSIS — N189 Chronic kidney disease, unspecified: Secondary | ICD-10-CM | POA: Diagnosis not present

## 2021-03-12 NOTE — Progress Notes (Signed)
Pt states no new concerns for today's visit.  

## 2021-04-13 DIAGNOSIS — I2510 Atherosclerotic heart disease of native coronary artery without angina pectoris: Secondary | ICD-10-CM | POA: Diagnosis not present

## 2021-04-13 DIAGNOSIS — I340 Nonrheumatic mitral (valve) insufficiency: Secondary | ICD-10-CM | POA: Diagnosis not present

## 2021-04-13 DIAGNOSIS — E782 Mixed hyperlipidemia: Secondary | ICD-10-CM | POA: Diagnosis not present

## 2021-04-13 DIAGNOSIS — Z961 Presence of intraocular lens: Secondary | ICD-10-CM | POA: Diagnosis not present

## 2021-04-13 DIAGNOSIS — I10 Essential (primary) hypertension: Secondary | ICD-10-CM | POA: Diagnosis not present

## 2021-07-06 ENCOUNTER — Ambulatory Visit: Payer: Medicare Other | Admitting: Dermatology

## 2021-07-09 ENCOUNTER — Inpatient Hospital Stay: Payer: Medicare Other | Attending: Oncology

## 2021-07-09 DIAGNOSIS — Z8673 Personal history of transient ischemic attack (TIA), and cerebral infarction without residual deficits: Secondary | ICD-10-CM | POA: Insufficient documentation

## 2021-07-09 DIAGNOSIS — Z87891 Personal history of nicotine dependence: Secondary | ICD-10-CM | POA: Insufficient documentation

## 2021-07-09 DIAGNOSIS — Z79899 Other long term (current) drug therapy: Secondary | ICD-10-CM | POA: Insufficient documentation

## 2021-07-09 DIAGNOSIS — Z809 Family history of malignant neoplasm, unspecified: Secondary | ICD-10-CM | POA: Insufficient documentation

## 2021-07-09 DIAGNOSIS — Z8551 Personal history of malignant neoplasm of bladder: Secondary | ICD-10-CM | POA: Insufficient documentation

## 2021-07-09 DIAGNOSIS — I129 Hypertensive chronic kidney disease with stage 1 through stage 4 chronic kidney disease, or unspecified chronic kidney disease: Secondary | ICD-10-CM | POA: Insufficient documentation

## 2021-07-09 DIAGNOSIS — C9000 Multiple myeloma not having achieved remission: Secondary | ICD-10-CM | POA: Insufficient documentation

## 2021-07-09 DIAGNOSIS — K219 Gastro-esophageal reflux disease without esophagitis: Secondary | ICD-10-CM | POA: Insufficient documentation

## 2021-07-09 DIAGNOSIS — N189 Chronic kidney disease, unspecified: Secondary | ICD-10-CM | POA: Insufficient documentation

## 2021-07-09 DIAGNOSIS — Z803 Family history of malignant neoplasm of breast: Secondary | ICD-10-CM | POA: Insufficient documentation

## 2021-07-09 DIAGNOSIS — D649 Anemia, unspecified: Secondary | ICD-10-CM | POA: Insufficient documentation

## 2021-07-09 DIAGNOSIS — E875 Hyperkalemia: Secondary | ICD-10-CM | POA: Insufficient documentation

## 2021-07-13 DIAGNOSIS — I10 Essential (primary) hypertension: Secondary | ICD-10-CM | POA: Diagnosis not present

## 2021-07-13 DIAGNOSIS — C9000 Multiple myeloma not having achieved remission: Secondary | ICD-10-CM | POA: Diagnosis not present

## 2021-07-13 DIAGNOSIS — I2510 Atherosclerotic heart disease of native coronary artery without angina pectoris: Secondary | ICD-10-CM | POA: Diagnosis not present

## 2021-07-15 DIAGNOSIS — E871 Hypo-osmolality and hyponatremia: Secondary | ICD-10-CM | POA: Diagnosis not present

## 2021-07-15 DIAGNOSIS — Z1389 Encounter for screening for other disorder: Secondary | ICD-10-CM | POA: Diagnosis not present

## 2021-07-15 DIAGNOSIS — E875 Hyperkalemia: Secondary | ICD-10-CM | POA: Diagnosis not present

## 2021-07-15 DIAGNOSIS — Z0000 Encounter for general adult medical examination without abnormal findings: Secondary | ICD-10-CM | POA: Diagnosis not present

## 2021-07-16 ENCOUNTER — Inpatient Hospital Stay: Payer: Medicare Other | Admitting: Oncology

## 2021-07-22 ENCOUNTER — Inpatient Hospital Stay: Payer: Medicare Other

## 2021-07-22 DIAGNOSIS — I129 Hypertensive chronic kidney disease with stage 1 through stage 4 chronic kidney disease, or unspecified chronic kidney disease: Secondary | ICD-10-CM | POA: Diagnosis not present

## 2021-07-22 DIAGNOSIS — Z87891 Personal history of nicotine dependence: Secondary | ICD-10-CM | POA: Diagnosis not present

## 2021-07-22 DIAGNOSIS — C9000 Multiple myeloma not having achieved remission: Secondary | ICD-10-CM | POA: Diagnosis not present

## 2021-07-22 DIAGNOSIS — Z803 Family history of malignant neoplasm of breast: Secondary | ICD-10-CM | POA: Diagnosis not present

## 2021-07-22 DIAGNOSIS — D472 Monoclonal gammopathy: Secondary | ICD-10-CM

## 2021-07-22 DIAGNOSIS — N189 Chronic kidney disease, unspecified: Secondary | ICD-10-CM | POA: Diagnosis not present

## 2021-07-22 DIAGNOSIS — E875 Hyperkalemia: Secondary | ICD-10-CM | POA: Diagnosis not present

## 2021-07-22 DIAGNOSIS — K219 Gastro-esophageal reflux disease without esophagitis: Secondary | ICD-10-CM | POA: Diagnosis not present

## 2021-07-22 DIAGNOSIS — Z8673 Personal history of transient ischemic attack (TIA), and cerebral infarction without residual deficits: Secondary | ICD-10-CM | POA: Diagnosis not present

## 2021-07-22 DIAGNOSIS — Z79899 Other long term (current) drug therapy: Secondary | ICD-10-CM | POA: Diagnosis not present

## 2021-07-22 DIAGNOSIS — Z809 Family history of malignant neoplasm, unspecified: Secondary | ICD-10-CM | POA: Diagnosis not present

## 2021-07-22 DIAGNOSIS — D649 Anemia, unspecified: Secondary | ICD-10-CM | POA: Diagnosis not present

## 2021-07-22 DIAGNOSIS — Z8551 Personal history of malignant neoplasm of bladder: Secondary | ICD-10-CM | POA: Diagnosis not present

## 2021-07-22 LAB — CBC WITH DIFFERENTIAL/PLATELET
Abs Immature Granulocytes: 0.01 10*3/uL (ref 0.00–0.07)
Basophils Absolute: 0 10*3/uL (ref 0.0–0.1)
Basophils Relative: 1 %
Eosinophils Absolute: 0 10*3/uL (ref 0.0–0.5)
Eosinophils Relative: 1 %
HCT: 35.1 % — ABNORMAL LOW (ref 36.0–46.0)
Hemoglobin: 11.2 g/dL — ABNORMAL LOW (ref 12.0–15.0)
Immature Granulocytes: 0 %
Lymphocytes Relative: 35 %
Lymphs Abs: 1.6 10*3/uL (ref 0.7–4.0)
MCH: 32.2 pg (ref 26.0–34.0)
MCHC: 31.9 g/dL (ref 30.0–36.0)
MCV: 100.9 fL — ABNORMAL HIGH (ref 80.0–100.0)
Monocytes Absolute: 0.3 10*3/uL (ref 0.1–1.0)
Monocytes Relative: 7 %
Neutro Abs: 2.6 10*3/uL (ref 1.7–7.7)
Neutrophils Relative %: 56 %
Platelets: 175 10*3/uL (ref 150–400)
RBC: 3.48 MIL/uL — ABNORMAL LOW (ref 3.87–5.11)
RDW: 13.2 % (ref 11.5–15.5)
WBC: 4.7 10*3/uL (ref 4.0–10.5)
nRBC: 0 % (ref 0.0–0.2)

## 2021-07-22 LAB — COMPREHENSIVE METABOLIC PANEL
ALT: 18 U/L (ref 0–44)
AST: 33 U/L (ref 15–41)
Albumin: 3.5 g/dL (ref 3.5–5.0)
Alkaline Phosphatase: 117 U/L (ref 38–126)
Anion gap: 5 (ref 5–15)
BUN: 34 mg/dL — ABNORMAL HIGH (ref 8–23)
CO2: 24 mmol/L (ref 22–32)
Calcium: 8.8 mg/dL — ABNORMAL LOW (ref 8.9–10.3)
Chloride: 102 mmol/L (ref 98–111)
Creatinine, Ser: 1.74 mg/dL — ABNORMAL HIGH (ref 0.44–1.00)
GFR, Estimated: 29 mL/min — ABNORMAL LOW (ref >60)
Glucose, Bld: 117 mg/dL — ABNORMAL HIGH (ref 70–99)
Potassium: 4.7 mmol/L (ref 3.5–5.1)
Sodium: 131 mmol/L — ABNORMAL LOW (ref 135–145)
Total Bilirubin: 0.5 mg/dL (ref 0.3–1.2)
Total Protein: 9.8 g/dL — ABNORMAL HIGH (ref 6.5–8.1)

## 2021-07-23 LAB — IGG, IGA, IGM
IgA: 54 mg/dL — ABNORMAL LOW (ref 64–422)
IgG (Immunoglobin G), Serum: 4334 mg/dL — ABNORMAL HIGH (ref 586–1602)
IgM (Immunoglobulin M), Srm: 88 mg/dL (ref 26–217)

## 2021-07-23 LAB — KAPPA/LAMBDA LIGHT CHAINS
Kappa free light chain: 251.3 mg/L — ABNORMAL HIGH (ref 3.3–19.4)
Kappa, lambda light chain ratio: 23.06 — ABNORMAL HIGH (ref 0.26–1.65)
Lambda free light chains: 10.9 mg/L (ref 5.7–26.3)

## 2021-07-24 DIAGNOSIS — E871 Hypo-osmolality and hyponatremia: Secondary | ICD-10-CM | POA: Diagnosis not present

## 2021-07-24 DIAGNOSIS — E875 Hyperkalemia: Secondary | ICD-10-CM | POA: Diagnosis not present

## 2021-07-24 LAB — MULTIPLE MYELOMA PANEL, SERUM
Albumin SerPl Elph-Mcnc: 3.9 g/dL (ref 2.9–4.4)
Albumin/Glob SerPl: 0.7 (ref 0.7–1.7)
Alpha 1: 0.2 g/dL (ref 0.0–0.4)
Alpha2 Glob SerPl Elph-Mcnc: 0.7 g/dL (ref 0.4–1.0)
B-Globulin SerPl Elph-Mcnc: 1 g/dL (ref 0.7–1.3)
Gamma Glob SerPl Elph-Mcnc: 3.7 g/dL — ABNORMAL HIGH (ref 0.4–1.8)
Globulin, Total: 5.6 g/dL — ABNORMAL HIGH (ref 2.2–3.9)
IgA: 58 mg/dL — ABNORMAL LOW (ref 64–422)
IgG (Immunoglobin G), Serum: 4409 mg/dL — ABNORMAL HIGH (ref 586–1602)
IgM (Immunoglobulin M), Srm: 89 mg/dL (ref 26–217)
M Protein SerPl Elph-Mcnc: 3.1 g/dL — ABNORMAL HIGH
Total Protein ELP: 9.5 g/dL — ABNORMAL HIGH (ref 6.0–8.5)

## 2021-07-24 NOTE — Progress Notes (Signed)
?[place]  ?[phone] [phone] ? ?IDEustace Pen OB: 1934/05/30  MR#: [id number]  LSL#:[id number] ? ?Patient Care Team: ?Jodi Marble, MD as PCP - General (Internal Medicine) ? ? ?CHIEF COMPLAINT: Smoldering myeloma ? ?INTERVAL HISTORY: Patient returns to clinic today for repeat laboratory work and further evaluation.  She continues to feel well and remains asymptomatic.  She denies any weakness or fatigue.  She has no neurologic complaints.  She denies any bony pain.  She denies any recent fevers or illnesses.  She has a good appetite and denies weight loss.  She denies any chest pain, shortness of breath, cough, or hemoptysis.  She denies any nausea, vomiting, constipation, or diarrhea.  Patient feels at her baseline offers no specific complaints today. ? ?REVIEW OF SYSTEMS:   ?Review of Systems  ?Constitutional: Negative.  Negative for fever, malaise/fatigue and weight loss.  ?Respiratory: Negative.  Negative for cough and shortness of breath.   ?Cardiovascular: Negative.  Negative for chest pain and leg swelling.  ?Gastrointestinal: Negative.  Negative for abdominal pain, blood in stool and melena.  ?Genitourinary: Negative.  Negative for dysuria.  ?Musculoskeletal: Negative.  Negative for back pain.  ?Skin: Negative.  Negative for rash.  ?Neurological: Negative.  Negative for sensory change, focal weakness and weakness.  ?Psychiatric/Behavioral: Negative.  The patient is not nervous/anxious.   ? ?As per HPI. Otherwise, a complete review of systems is negative. ? ?PAST MEDICAL HISTORY: ?Past Medical History:  ?Diagnosis Date  ? Arthritis   ? Bladder cancer ([place])   ? Bladder cancer ([place])   ? Cancer [facility])   ? multiple myeloma  ? Chronic kidney disease   ? Chronic kidney disease   ? Dizziness   ? GERD (gastroesophageal reflux disease)   ? Hypertension   ? Multiple myeloma ([place])   ? Stroke [facility])   ? tia x 2  ? Tubular adenoma of colon   ? ? ?PAST SURGICAL  HISTORY: ?Past Surgical History:  ?Procedure Laterality Date  ? bladder tumor removed    ? BREAST EXCISIONAL BIOPSY Bilateral 1970  ? Benign  ? BREAST SURGERY    ? bx  ? CATARACT EXTRACTION W/PHACO Left 12/16/2016  ? Procedure: CATARACT EXTRACTION PHACO AND INTRAOCULAR LENS PLACEMENT ([facility]);  Surgeon: Eulogio Bear, MD;  Location: [facility] ORS;  Service: Ophthalmology;  Laterality: Left;  [place] 00:38.0 ?AP% 14.6 ?CDE 5.54 ?Fluid pack lot # [id number]  ? CATARACT EXTRACTION W/PHACO Right 03/03/2017  ? Procedure: CATARACT EXTRACTION PHACO AND INTRAOCULAR LENS PLACEMENT ([facility]);  Surgeon: Eulogio Bear, MD;  Location: [facility] ORS;  Service: Ophthalmology;  Laterality: Right;  Lot # [id number] ?[place]: 00:29.8 ?AP%: 10.1 ?CDE: 3.01  ? COLONOSCOPY WITH PROPOFOL N/A 07/25/2015  ? Procedure: COLONOSCOPY WITH PROPOFOL;  Surgeon: Josefine Class, MD;  Location: [facility] ENDOSCOPY;  Service: Endoscopy;  Laterality: N/A;  ? COLONOSCOPY WITH PROPOFOL N/A 06/29/2017  ? Procedure: COLONOSCOPY WITH PROPOFOL;  Surgeon: Toledo, Benay Pike, MD;  Location: [facility] ENDOSCOPY;  Service: Gastroenterology;  Laterality: N/A;  ? CORONARY ANGIOGRAPHY N/A 05/24/2019  ? Procedure: CORONARY ANGIOGRAPHY;  Surgeon: Dionisio David, MD;  Location: [place] CV LAB;  Service: Cardiovascular;  Laterality: N/A;  ? ESOPHAGOGASTRODUODENOSCOPY (EGD) WITH PROPOFOL N/A 07/25/2015  ? Procedure: ESOPHAGOGASTRODUODENOSCOPY (EGD) WITH PROPOFOL;  Surgeon: Josefine Class, MD;  Location: [facility] ENDOSCOPY;  Service: Endoscopy;  Laterality: N/A;  ? ESOPHAGOGASTRODUODENOSCOPY (EGD) WITH PROPOFOL N/A 06/29/2017  ? Procedure: ESOPHAGOGASTRODUODENOSCOPY (EGD) WITH PROPOFOL;  Surgeon: [name], Benay Pike, MD;  Location: [facility] ENDOSCOPY;  Service: Gastroenterology;  Laterality: N/A;  ? HERNIA REPAIR    ? LEFT HEART CATH N/A 05/24/2019  ? Procedure: Left Heart Cath;  Surgeon: Dionisio David, MD;  Location: [place] CV LAB;  Service: Cardiovascular;  Laterality: N/A;  ? ? ?FAMILY  HISTORY: ?Family History  ?Problem Relation Age of Onset  ? Cancer Mother   ? Colon cancer Mother   ? Colon polyps Mother   ? Cancer Sister   ? Breast cancer Sister 28  ? Cancer Brother   ? Bladder Cancer Neg Hx   ? Kidney cancer Neg Hx   ? ? ?ADVANCED DIRECTIVES (Y/N):  N ? ?HEALTH MAINTENANCE: ?Social History  ? ?Tobacco Use  ? Smoking status: Former  ? Smokeless tobacco: Never  ?Vaping Use  ? Vaping Use: Never used  ?Substance Use Topics  ? Alcohol use: No  ? Drug use: No  ? ? ? Colonoscopy: ? PAP: ? Bone density: ? Lipid panel: ? ?Allergies  ?Allergen Reactions  ? Iodine   ?  Seizure ? ?Betadine ok ?Other reaction(s): Other (See Comments) ?Sneezing ?Seizure ? ?Betadine ok  ? Sucralfate Rash  ? Penicillins Other (See Comments) and Rash  ?  Has patient had a PCN reaction causing immediate rash, facial/tongue/throat swelling, SOB or lightheadedness with hypotension: Unknown ?Has patient had a PCN reaction causing severe rash involving mucus membranes or skin necrosis: Unknown ?Has patient had a PCN reaction that required hospitalization: Unknown ?Has patient had a PCN reaction occurring within the last 10 years: Unknown ?If all of the above answers are "NO", then may proceed with Cephalosporin use. ? ?Other reaction(s): Other (See Comments) ?Has patient had a PCN reaction causing immediate rash, facial/tongue/throat swelling, SOB or lightheadedness with hypotension: Unknown ?Has patient had a PCN reaction causing severe rash involving mucus membranes or skin necrosis: Unknown ?Has patient had a PCN reaction that required hospitalization: Unknown ?Has patient had a PCN reaction occurring within the last 10 years: Unknown ?If all of the above answers are "NO", then may proceed with Cephalosporin use. ?Has patient had a PCN reaction causing immediate rash, facial/tongue/throat swelling, SOB or lightheadedness with hypotension: Unknown ?Has patient had a PCN reaction causing severe rash involving mucus membranes or  skin necrosis: Unknown ?Has patient had a PCN reaction that required hospitalization: Unknown ?Has patient had a PCN reaction occurring within the last 10 years: Unknown ?If all of the above answers are "NO", then may proceed with Cephalosporin use.  ? ? ?Current Outpatient Medications  ?Medication Sig Dispense Refill  ? Cholecalciferol (VITAMIN D3) 10 MCG (400 UNIT) CAPS     ? losartan (COZAAR) 25 MG tablet Take 25 mg by mouth daily.    ? meclizine (ANTIVERT) 25 MG tablet Take 25 mg by mouth 3 (three) times daily as needed.  1  ? metoprolol succinate (TOPROL-XL) 25 MG 24 hr tablet Take 50 mg by mouth daily.     ? Omega-3 Fatty Acids (FISH OIL) 1000 MG CAPS Take 1 capsule by mouth daily.    ? pantoprazole (PROTONIX) 40 MG tablet Take by mouth.    ? Pumpkin Seed-Soy Germ (AZO BLADDER CONTROL/GO-LESS) CAPS     ? ranolazine (RANEXA) 500 MG 12 hr tablet Take 500 mg by mouth 2 (two) times daily.    ? rOPINIRole (REQUIP) 0.25 MG tablet Take 1 tablet by mouth as needed.    ? rosuvastatin (CRESTOR) 10 MG tablet Take 10 mg by mouth daily.    ?  nitroGLYCERIN (NITROSTAT) 0.3 MG SL tablet Place 1 tablet under the tongue as needed. (Patient not taking: Reported on 07/29/2021)    ? ?No current facility-administered medications for this visit.  ? ?Facility-Administered Medications Ordered in Other Visits  ?Medication Dose Route Frequency Provider Last Rate Last Admin  ? sodium chloride flush (NS) 0.9 % injection 3 mL  3 mL Intravenous Q12H Dionisio David, MD      ? ? ?OBJECTIVE: ?Vitals:  ? 07/29/21 0949  ?BP: 124/83  ?Pulse: 74  ?Resp: 18  ?SpO2: 97%  ?   Body mass index is 29.98 kg/m?Marland Kitchen    ECOG FS:0 - Asymptomatic ? ?General: Well-developed, well-nourished, no acute distress. ?Eyes: Pink conjunctiva, anicteric sclera. ?HEENT: Normocephalic, moist mucous membranes. ?Lungs: No audible wheezing or coughing. ?Heart: Regular rate and rhythm. ?Abdomen: Soft, nontender, no obvious distention. ?Musculoskeletal: No edema, cyanosis, or  clubbing. ?Neuro: Alert, answering all questions appropriately. Cranial nerves grossly intact. ?Skin: No rashes or petechiae noted. ?Psych: Normal affect. ? ? ?LAB RESULTS: ? ?Lab Results  ?Component Value Date  ?

## 2021-07-29 ENCOUNTER — Inpatient Hospital Stay (HOSPITAL_BASED_OUTPATIENT_CLINIC_OR_DEPARTMENT_OTHER): Payer: Medicare Other | Admitting: Oncology

## 2021-07-29 VITALS — BP 124/83 | HR 74 | Resp 18 | Ht 60.0 in | Wt 153.5 lb

## 2021-07-29 DIAGNOSIS — Z8673 Personal history of transient ischemic attack (TIA), and cerebral infarction without residual deficits: Secondary | ICD-10-CM | POA: Diagnosis not present

## 2021-07-29 DIAGNOSIS — D472 Monoclonal gammopathy: Secondary | ICD-10-CM | POA: Diagnosis not present

## 2021-07-29 DIAGNOSIS — Z809 Family history of malignant neoplasm, unspecified: Secondary | ICD-10-CM | POA: Diagnosis not present

## 2021-07-29 DIAGNOSIS — Z803 Family history of malignant neoplasm of breast: Secondary | ICD-10-CM | POA: Diagnosis not present

## 2021-07-29 DIAGNOSIS — D649 Anemia, unspecified: Secondary | ICD-10-CM | POA: Diagnosis not present

## 2021-07-29 DIAGNOSIS — I129 Hypertensive chronic kidney disease with stage 1 through stage 4 chronic kidney disease, or unspecified chronic kidney disease: Secondary | ICD-10-CM | POA: Diagnosis not present

## 2021-07-29 DIAGNOSIS — K219 Gastro-esophageal reflux disease without esophagitis: Secondary | ICD-10-CM | POA: Diagnosis not present

## 2021-07-29 DIAGNOSIS — E875 Hyperkalemia: Secondary | ICD-10-CM | POA: Diagnosis not present

## 2021-07-29 DIAGNOSIS — Z79899 Other long term (current) drug therapy: Secondary | ICD-10-CM | POA: Diagnosis not present

## 2021-07-29 DIAGNOSIS — N189 Chronic kidney disease, unspecified: Secondary | ICD-10-CM | POA: Diagnosis not present

## 2021-07-29 DIAGNOSIS — Z87891 Personal history of nicotine dependence: Secondary | ICD-10-CM | POA: Diagnosis not present

## 2021-07-29 DIAGNOSIS — C9000 Multiple myeloma not having achieved remission: Secondary | ICD-10-CM | POA: Diagnosis not present

## 2021-07-29 DIAGNOSIS — Z8551 Personal history of malignant neoplasm of bladder: Secondary | ICD-10-CM | POA: Diagnosis not present

## 2021-07-30 DIAGNOSIS — C9001 Multiple myeloma in remission: Secondary | ICD-10-CM | POA: Diagnosis not present

## 2021-07-30 DIAGNOSIS — N184 Chronic kidney disease, stage 4 (severe): Secondary | ICD-10-CM | POA: Diagnosis not present

## 2021-07-30 DIAGNOSIS — E875 Hyperkalemia: Secondary | ICD-10-CM | POA: Diagnosis not present

## 2021-07-30 DIAGNOSIS — D631 Anemia in chronic kidney disease: Secondary | ICD-10-CM | POA: Diagnosis not present

## 2021-07-30 DIAGNOSIS — I10 Essential (primary) hypertension: Secondary | ICD-10-CM | POA: Diagnosis not present

## 2021-07-30 DIAGNOSIS — N2581 Secondary hyperparathyroidism of renal origin: Secondary | ICD-10-CM | POA: Diagnosis not present

## 2021-07-30 DIAGNOSIS — R809 Proteinuria, unspecified: Secondary | ICD-10-CM | POA: Diagnosis not present

## 2021-08-03 DIAGNOSIS — E871 Hypo-osmolality and hyponatremia: Secondary | ICD-10-CM | POA: Diagnosis not present

## 2021-08-03 DIAGNOSIS — E875 Hyperkalemia: Secondary | ICD-10-CM | POA: Diagnosis not present

## 2021-08-03 DIAGNOSIS — N184 Chronic kidney disease, stage 4 (severe): Secondary | ICD-10-CM | POA: Diagnosis not present

## 2021-08-07 ENCOUNTER — Inpatient Hospital Stay: Payer: Medicare Other | Attending: Oncology

## 2021-08-07 NOTE — Progress Notes (Signed)
Nutrition Assessment ? ? ?Reason for Assessment:  ? ?Referral to discuss low potassium diet.  ? ? ?ASSESSMENT:  ?87 year old female with multiple myeloma currently not receiving treatment.  Past medical history of bladder cancer, CKD, stroke, GERD, HTN.   ? ?Met with patient in clinic. Concerned about recent elevated K from PCP's office (5.5). She was taken off losartan and lab rechecked by PCP and K in normal range.  Patient wanting to know foods that are high in potassium and if she should follow any special diet for her myeloma.  Appetite is normal.   ? ? ?Medications: reviewed ? ? ?Labs: 4/19 at cancer center Na 131, K 4.7 and has been for > 1 year, glucose 117, BUN 34, creatinine 1.74 ? ? ?Anthropometrics:  ? ?Height: 60 inches ?Weight: 153 lb 07/29/21 ?Stable weight ?BMI: 29 ? ? ? ?NUTRITION DIAGNOSIS: Food and nutrition related knowledge deficit related to elevated K as evidenced by questions regarding foods high in K ? ? ?INTERVENTION:  ?Educated patient on foods high in potassium and lower potassium food options.  Potassium food list given to patient from AND.  Since K level is normal and was normal on recheck at PCP's office would not restrict potassium at this time.  If levels increase would reduce high potassium foods in diet at that time.  ?Encouraged plant based diet, including moderate amounts of lean protein.   ?Patient has labs monitored regularly by PCP, nephrology and oncology.  Encouraged continued follow-up.   ?Contact information provided ? ? ?Next Visit: no follow-up ?RD available as needed ? ?Julie Khan, RD, LDN ?Registered Dietitian ?[phone] ? ? ? ? ? ?

## 2021-09-11 DIAGNOSIS — I2510 Atherosclerotic heart disease of native coronary artery without angina pectoris: Secondary | ICD-10-CM | POA: Diagnosis not present

## 2021-09-11 DIAGNOSIS — E782 Mixed hyperlipidemia: Secondary | ICD-10-CM | POA: Diagnosis not present

## 2021-09-11 DIAGNOSIS — I340 Nonrheumatic mitral (valve) insufficiency: Secondary | ICD-10-CM | POA: Diagnosis not present

## 2021-09-11 DIAGNOSIS — I10 Essential (primary) hypertension: Secondary | ICD-10-CM | POA: Diagnosis not present

## 2021-10-22 DIAGNOSIS — I10 Essential (primary) hypertension: Secondary | ICD-10-CM | POA: Diagnosis not present

## 2021-10-22 DIAGNOSIS — N184 Chronic kidney disease, stage 4 (severe): Secondary | ICD-10-CM | POA: Diagnosis not present

## 2021-10-22 DIAGNOSIS — R809 Proteinuria, unspecified: Secondary | ICD-10-CM | POA: Diagnosis not present

## 2021-10-22 DIAGNOSIS — N2581 Secondary hyperparathyroidism of renal origin: Secondary | ICD-10-CM | POA: Diagnosis not present

## 2021-11-04 DIAGNOSIS — C9001 Multiple myeloma in remission: Secondary | ICD-10-CM | POA: Diagnosis not present

## 2021-11-04 DIAGNOSIS — I129 Hypertensive chronic kidney disease with stage 1 through stage 4 chronic kidney disease, or unspecified chronic kidney disease: Secondary | ICD-10-CM | POA: Diagnosis not present

## 2021-11-04 DIAGNOSIS — E875 Hyperkalemia: Secondary | ICD-10-CM | POA: Diagnosis not present

## 2021-11-04 DIAGNOSIS — N184 Chronic kidney disease, stage 4 (severe): Secondary | ICD-10-CM | POA: Diagnosis not present

## 2021-11-04 DIAGNOSIS — N2581 Secondary hyperparathyroidism of renal origin: Secondary | ICD-10-CM | POA: Diagnosis not present

## 2021-11-04 DIAGNOSIS — I10 Essential (primary) hypertension: Secondary | ICD-10-CM | POA: Diagnosis not present

## 2021-11-04 DIAGNOSIS — D631 Anemia in chronic kidney disease: Secondary | ICD-10-CM | POA: Diagnosis not present

## 2021-11-20 DIAGNOSIS — D509 Iron deficiency anemia, unspecified: Secondary | ICD-10-CM | POA: Diagnosis not present

## 2021-11-20 DIAGNOSIS — I2510 Atherosclerotic heart disease of native coronary artery without angina pectoris: Secondary | ICD-10-CM | POA: Diagnosis not present

## 2021-11-20 DIAGNOSIS — N184 Chronic kidney disease, stage 4 (severe): Secondary | ICD-10-CM | POA: Diagnosis not present

## 2021-11-20 DIAGNOSIS — I129 Hypertensive chronic kidney disease with stage 1 through stage 4 chronic kidney disease, or unspecified chronic kidney disease: Secondary | ICD-10-CM | POA: Diagnosis not present

## 2021-11-20 DIAGNOSIS — R1031 Right lower quadrant pain: Secondary | ICD-10-CM | POA: Diagnosis not present

## 2021-11-23 DIAGNOSIS — N184 Chronic kidney disease, stage 4 (severe): Secondary | ICD-10-CM | POA: Diagnosis not present

## 2021-11-23 DIAGNOSIS — I2510 Atherosclerotic heart disease of native coronary artery without angina pectoris: Secondary | ICD-10-CM | POA: Diagnosis not present

## 2021-11-23 DIAGNOSIS — I10 Essential (primary) hypertension: Secondary | ICD-10-CM | POA: Diagnosis not present

## 2021-11-23 DIAGNOSIS — D509 Iron deficiency anemia, unspecified: Secondary | ICD-10-CM | POA: Diagnosis not present

## 2021-11-24 ENCOUNTER — Inpatient Hospital Stay: Payer: Medicare Other | Attending: Oncology

## 2021-11-24 DIAGNOSIS — I129 Hypertensive chronic kidney disease with stage 1 through stage 4 chronic kidney disease, or unspecified chronic kidney disease: Secondary | ICD-10-CM | POA: Insufficient documentation

## 2021-11-24 DIAGNOSIS — R531 Weakness: Secondary | ICD-10-CM | POA: Insufficient documentation

## 2021-11-24 DIAGNOSIS — E875 Hyperkalemia: Secondary | ICD-10-CM | POA: Insufficient documentation

## 2021-11-24 DIAGNOSIS — N189 Chronic kidney disease, unspecified: Secondary | ICD-10-CM | POA: Insufficient documentation

## 2021-11-24 DIAGNOSIS — R5383 Other fatigue: Secondary | ICD-10-CM | POA: Insufficient documentation

## 2021-11-24 DIAGNOSIS — C9000 Multiple myeloma not having achieved remission: Secondary | ICD-10-CM | POA: Insufficient documentation

## 2021-11-24 DIAGNOSIS — K219 Gastro-esophageal reflux disease without esophagitis: Secondary | ICD-10-CM | POA: Insufficient documentation

## 2021-11-24 DIAGNOSIS — Z809 Family history of malignant neoplasm, unspecified: Secondary | ICD-10-CM | POA: Insufficient documentation

## 2021-11-24 DIAGNOSIS — Z803 Family history of malignant neoplasm of breast: Secondary | ICD-10-CM | POA: Insufficient documentation

## 2021-11-24 DIAGNOSIS — Z8673 Personal history of transient ischemic attack (TIA), and cerebral infarction without residual deficits: Secondary | ICD-10-CM | POA: Insufficient documentation

## 2021-11-25 ENCOUNTER — Inpatient Hospital Stay: Payer: Medicare Other

## 2021-11-25 DIAGNOSIS — K219 Gastro-esophageal reflux disease without esophagitis: Secondary | ICD-10-CM | POA: Diagnosis not present

## 2021-11-25 DIAGNOSIS — C9000 Multiple myeloma not having achieved remission: Secondary | ICD-10-CM | POA: Diagnosis not present

## 2021-11-25 DIAGNOSIS — Z8673 Personal history of transient ischemic attack (TIA), and cerebral infarction without residual deficits: Secondary | ICD-10-CM | POA: Diagnosis not present

## 2021-11-25 DIAGNOSIS — R5383 Other fatigue: Secondary | ICD-10-CM | POA: Diagnosis not present

## 2021-11-25 DIAGNOSIS — N189 Chronic kidney disease, unspecified: Secondary | ICD-10-CM | POA: Diagnosis not present

## 2021-11-25 DIAGNOSIS — I129 Hypertensive chronic kidney disease with stage 1 through stage 4 chronic kidney disease, or unspecified chronic kidney disease: Secondary | ICD-10-CM | POA: Diagnosis not present

## 2021-11-25 DIAGNOSIS — R531 Weakness: Secondary | ICD-10-CM | POA: Diagnosis not present

## 2021-11-25 DIAGNOSIS — E875 Hyperkalemia: Secondary | ICD-10-CM | POA: Diagnosis not present

## 2021-11-25 DIAGNOSIS — Z803 Family history of malignant neoplasm of breast: Secondary | ICD-10-CM | POA: Diagnosis not present

## 2021-11-25 DIAGNOSIS — Z809 Family history of malignant neoplasm, unspecified: Secondary | ICD-10-CM | POA: Diagnosis not present

## 2021-11-25 LAB — COMPREHENSIVE METABOLIC PANEL
ALT: 18 U/L (ref 0–44)
AST: 34 U/L (ref 15–41)
Albumin: 3.4 g/dL — ABNORMAL LOW (ref 3.5–5.0)
Alkaline Phosphatase: 112 U/L (ref 38–126)
Anion gap: 6 (ref 5–15)
BUN: 38 mg/dL — ABNORMAL HIGH (ref 8–23)
CO2: 21 mmol/L — ABNORMAL LOW (ref 22–32)
Calcium: 8.6 mg/dL — ABNORMAL LOW (ref 8.9–10.3)
Chloride: 106 mmol/L (ref 98–111)
Creatinine, Ser: 2.06 mg/dL — ABNORMAL HIGH (ref 0.44–1.00)
GFR, Estimated: 23 mL/min — ABNORMAL LOW (ref >60)
Glucose, Bld: 120 mg/dL — ABNORMAL HIGH (ref 70–99)
Potassium: 3.8 mmol/L (ref 3.5–5.1)
Sodium: 133 mmol/L — ABNORMAL LOW (ref 135–145)
Total Bilirubin: 0.8 mg/dL (ref 0.3–1.2)
Total Protein: 9.6 g/dL — ABNORMAL HIGH (ref 6.5–8.1)

## 2021-11-25 LAB — CBC WITH DIFFERENTIAL/PLATELET
Abs Immature Granulocytes: 0.01 10*3/uL (ref 0.00–0.07)
Basophils Absolute: 0 10*3/uL (ref 0.0–0.1)
Basophils Relative: 1 %
Eosinophils Absolute: 0 10*3/uL (ref 0.0–0.5)
Eosinophils Relative: 1 %
HCT: 34.1 % — ABNORMAL LOW (ref 36.0–46.0)
Hemoglobin: 11.2 g/dL — ABNORMAL LOW (ref 12.0–15.0)
Immature Granulocytes: 0 %
Lymphocytes Relative: 35 %
Lymphs Abs: 1.3 10*3/uL (ref 0.7–4.0)
MCH: 32.6 pg (ref 26.0–34.0)
MCHC: 32.8 g/dL (ref 30.0–36.0)
MCV: 99.1 fL (ref 80.0–100.0)
Monocytes Absolute: 0.3 10*3/uL (ref 0.1–1.0)
Monocytes Relative: 9 %
Neutro Abs: 2 10*3/uL (ref 1.7–7.7)
Neutrophils Relative %: 54 %
Platelets: 155 10*3/uL (ref 150–400)
RBC: 3.44 MIL/uL — ABNORMAL LOW (ref 3.87–5.11)
RDW: 13 % (ref 11.5–15.5)
WBC: 3.6 10*3/uL — ABNORMAL LOW (ref 4.0–10.5)
nRBC: 0 % (ref 0.0–0.2)

## 2021-11-26 LAB — KAPPA/LAMBDA LIGHT CHAINS
Kappa free light chain: 282.3 mg/L — ABNORMAL HIGH (ref 3.3–19.4)
Kappa, lambda light chain ratio: 25.9 — ABNORMAL HIGH (ref 0.26–1.65)
Lambda free light chains: 10.9 mg/L (ref 5.7–26.3)

## 2021-11-26 LAB — IGG, IGA, IGM
IgA: 53 mg/dL — ABNORMAL LOW (ref 64–422)
IgG (Immunoglobin G), Serum: 4411 mg/dL — ABNORMAL HIGH (ref 586–1602)
IgM (Immunoglobulin M), Srm: 77 mg/dL (ref 26–217)

## 2021-11-30 LAB — MULTIPLE MYELOMA PANEL, SERUM
Albumin SerPl Elph-Mcnc: 3.4 g/dL (ref 2.9–4.4)
Albumin/Glob SerPl: 0.7 (ref 0.7–1.7)
Alpha 1: 0.2 g/dL (ref 0.0–0.4)
Alpha2 Glob SerPl Elph-Mcnc: 0.6 g/dL (ref 0.4–1.0)
B-Globulin SerPl Elph-Mcnc: 0.9 g/dL (ref 0.7–1.3)
Gamma Glob SerPl Elph-Mcnc: 3.6 g/dL — ABNORMAL HIGH (ref 0.4–1.8)
Globulin, Total: 5.3 g/dL — ABNORMAL HIGH (ref 2.2–3.9)
IgA: 54 mg/dL — ABNORMAL LOW (ref 64–422)
IgG (Immunoglobin G), Serum: 4249 mg/dL — ABNORMAL HIGH (ref 586–1602)
IgM (Immunoglobulin M), Srm: 74 mg/dL (ref 26–217)
M Protein SerPl Elph-Mcnc: 3.4 g/dL — ABNORMAL HIGH
Total Protein ELP: 8.7 g/dL — ABNORMAL HIGH (ref 6.0–8.5)

## 2021-11-30 NOTE — Progress Notes (Unsigned)
[place]  [phone] [phone]  ID: Julie Khan OB: 11/29/1934  MR#: [id number]  MWN#:[id number]  Patient Care Team: Jodi Marble, MD as PCP - General (Internal Medicine)   CHIEF COMPLAINT: Smoldering myeloma  INTERVAL HISTORY: Patient returns to clinic today for repeat laboratory work and routine 6-month evaluation.  She continues to feel well and remains asymptomatic.  She denies any weakness or fatigue.  She has no neurologic complaints.  She denies any bony pain.  She denies any recent fevers or illnesses.  She has a good appetite and denies weight loss.  She denies any chest pain, shortness of breath, cough, or hemoptysis.  She denies any nausea, vomiting, constipation, or diarrhea.  Patient offers no specific complaints today.  REVIEW OF SYSTEMS:   Review of Systems  Constitutional: Negative.  Negative for fever, malaise/fatigue and weight loss.  Respiratory: Negative.  Negative for cough and shortness of breath.   Cardiovascular: Negative.  Negative for chest pain and leg swelling.  Gastrointestinal: Negative.  Negative for abdominal pain, blood in stool and melena.  Genitourinary: Negative.  Negative for dysuria.  Musculoskeletal: Negative.  Negative for back pain.  Skin: Negative.  Negative for rash.  Neurological: Negative.  Negative for sensory change, focal weakness and weakness.  Psychiatric/Behavioral: Negative.  The patient is not nervous/anxious.     As per HPI. Otherwise, a complete review of systems is negative.  PAST MEDICAL HISTORY: Past Medical History:  Diagnosis Date   Arthritis    Bladder cancer ([place])    Bladder cancer ([place])    Cancer ([place])    multiple myeloma   Chronic kidney disease    Chronic kidney disease    Dizziness    GERD (gastroesophageal reflux disease)    Hypertension    Multiple myeloma ([place])    Stroke ([place])    tia x 2   Tubular adenoma of colon     PAST SURGICAL HISTORY: Past  Surgical History:  Procedure Laterality Date   bladder tumor removed     BREAST EXCISIONAL BIOPSY Bilateral 1970   Benign   BREAST SURGERY     bx   CATARACT EXTRACTION W/PHACO Left 12/16/2016   Procedure: CATARACT EXTRACTION PHACO AND INTRAOCULAR LENS PLACEMENT ([place]);  Surgeon: Eulogio Bear, MD;  Location: [facility] ORS;  Service: Ophthalmology;  Laterality: Left;  [place] 00:38.0 AP% 14.6 CDE 5.54 Fluid pack lot # [id number]   CATARACT EXTRACTION W/PHACO Right 03/03/2017   Procedure: CATARACT EXTRACTION PHACO AND INTRAOCULAR LENS PLACEMENT (IOC);  Surgeon: Eulogio Bear, MD;  Location: [facility] ORS;  Service: Ophthalmology;  Laterality: Right;  Lot # [id number] H [place]: 00:29.8 AP%: 10.1 CDE: 3.01   COLONOSCOPY WITH PROPOFOL N/A 07/25/2015   Procedure: COLONOSCOPY WITH PROPOFOL;  Surgeon: Josefine Class, MD;  Location: [facility] ENDOSCOPY;  Service: Endoscopy;  Laterality: N/A;   COLONOSCOPY WITH PROPOFOL N/A 06/29/2017   Procedure: COLONOSCOPY WITH PROPOFOL;  Surgeon: Toledo, Benay Pike, MD;  Location: [facility] ENDOSCOPY;  Service: Gastroenterology;  Laterality: N/A;   CORONARY ANGIOGRAPHY N/A 05/24/2019   Procedure: CORONARY ANGIOGRAPHY;  Surgeon: Dionisio David, MD;  Location: [facility];  Service: Cardiovascular;  Laterality: N/A;   ESOPHAGOGASTRODUODENOSCOPY (EGD) WITH PROPOFOL N/A 07/25/2015   Procedure: ESOPHAGOGASTRODUODENOSCOPY (EGD) WITH PROPOFOL;  Surgeon: Josefine Class, MD;  Location[facility] ENDOSCOPY;  Service: Endoscopy;  Laterality: N/A;   ESOPHAGOGASTRODUODENOSCOPY (EGD) WITH PROPOFOL N/A 06/29/2017   Procedure: ESOPHAGOGASTRODUODENOSCOPY (EGD) WITH PROPOFOL;  Surgeon: [name], Benay Pike, MD;  Location:  [facility] ENDOSCOPY;  Service: Gastroenterology;  Laterality: N/A;   HERNIA REPAIR     LEFT HEART CATH N/A 05/24/2019   Procedure: Left Heart Cath;  Surgeon: Dionisio David, MD;  Location: [facility] [facility];  Service: Cardiovascular;  Laterality: N/A;    FAMILY HISTORY: Family  History  Problem Relation Age of Onset   Cancer Mother    Colon cancer Mother    Colon polyps Mother    Cancer Sister    Breast cancer Sister 50   Cancer Brother    Bladder Cancer Neg Hx    Kidney cancer Neg Hx     ADVANCED DIRECTIVES (Y/N):  N  HEALTH MAINTENANCE: Social History   Tobacco Use   Smoking status: Former   Smokeless tobacco: Never  Scientific laboratory technician Use: Never used  Substance Use Topics   Alcohol use: No   Drug use: No     Colonoscopy:  PAP:  Bone density:  Lipid panel:  Allergies  Allergen Reactions   Iodine     Seizure  Betadine ok Other reaction(s): Other (See Comments) Sneezing Seizure  Betadine ok   Sucralfate Rash   Penicillins Other (See Comments) and Rash    Has patient had a PCN reaction causing immediate rash, facial/tongue/throat swelling, SOB or lightheadedness with hypotension: Unknown Has patient had a PCN reaction causing severe rash involving mucus membranes or skin necrosis: Unknown Has patient had a PCN reaction that required hospitalization: Unknown Has patient had a PCN reaction occurring within the last 10 years: Unknown If all of the above answers are "NO", then may proceed with Cephalosporin use.  Other reaction(s): Other (See Comments) Has patient had a PCN reaction causing immediate rash, facial/tongue/throat swelling, SOB or lightheadedness with hypotension: Unknown Has patient had a PCN reaction causing severe rash involving mucus membranes or skin necrosis: Unknown Has patient had a PCN reaction that required hospitalization: Unknown Has patient had a PCN reaction occurring within the last 10 years: Unknown If all of the above answers are "NO", then may proceed with Cephalosporin use. Has patient had a PCN reaction causing immediate rash, facial/tongue/throat swelling, SOB or lightheadedness with hypotension: Unknown Has patient had a PCN reaction causing severe rash involving mucus membranes or skin necrosis:  Unknown Has patient had a PCN reaction that required hospitalization: Unknown Has patient had a PCN reaction occurring within the last 10 years: Unknown If all of the above answers are "NO", then may proceed with Cephalosporin use.    Current Outpatient Medications  Medication Sig Dispense Refill   Cholecalciferol (VITAMIN D3) 10 MCG (400 UNIT) CAPS      losartan (COZAAR) 25 MG tablet Take 25 mg by mouth daily.     meclizine (ANTIVERT) 25 MG tablet Take 25 mg by mouth 3 (three) times daily as needed.  1   metoprolol succinate (TOPROL-XL) 25 MG 24 hr tablet Take 50 mg by mouth daily.      Omega-3 Fatty Acids (FISH OIL) 1000 MG CAPS Take 1 capsule by mouth daily.     pantoprazole (PROTONIX) 40 MG tablet Take by mouth.     Pumpkin Seed-Soy Germ (AZO BLADDER CONTROL/GO-LESS) CAPS      ranolazine (RANEXA) 500 MG 12 hr tablet Take 500 mg by mouth 2 (two) times daily.     rOPINIRole (REQUIP) 0.25 MG tablet Take 1 tablet by mouth as needed.     rosuvastatin (CRESTOR) 10 MG tablet Take 10 mg by mouth daily.  nitroGLYCERIN (NITROSTAT) 0.3 MG SL tablet Place 1 tablet under the tongue as needed. (Patient not taking: Reported on 07/29/2021)     No current facility-administered medications for this visit.   Facility-Administered Medications Ordered in Other Visits  Medication Dose Route Frequency Provider Last Rate Last Admin   sodium chloride flush (NS) 0.9 % injection 3 mL  3 mL Intravenous Q12H Neoma Laming A, MD        OBJECTIVE: Vitals:   12/01/21 0950  BP: 122/72  Pulse: 81  Temp: (!) 96.4 F (35.8 C)  SpO2: 99%     Body mass index is 29.49 kg/m.    ECOG FS:0 - Asymptomatic  General: Well-developed, well-nourished, no acute distress. Eyes: Pink conjunctiva, anicteric sclera. HEENT: Normocephalic, moist mucous membranes. Lungs: No audible wheezing or coughing. Heart: Regular rate and rhythm. Abdomen: Soft, nontender, no obvious distention. Musculoskeletal: No edema, cyanosis, or  clubbing. Neuro: Alert, answering all questions appropriately. Cranial nerves grossly intact. Skin: No rashes or petechiae noted. Psych: Normal affect.  LAB RESULTS:  Lab Results  Component Value Date   NA 133 (L) 11/25/2021   K 3.8 11/25/2021   CL 106 11/25/2021   CO2 21 (L) 11/25/2021   GLUCOSE 120 (H) 11/25/2021   BUN 38 (H) 11/25/2021   CREATININE 2.06 (H) 11/25/2021   CALCIUM 8.6 (L) 11/25/2021   PROT 9.6 (H) 11/25/2021   ALBUMIN 3.4 (L) 11/25/2021   AST 34 11/25/2021   ALT 18 11/25/2021   ALKPHOS 112 11/25/2021   BILITOT 0.8 11/25/2021   GFRNONAA 23 (L) 11/25/2021   GFRAA 33 (L) 11/19/2019    Lab Results  Component Value Date   WBC 3.6 (L) 11/25/2021   NEUTROABS 2.0 11/25/2021   HGB 11.2 (L) 11/25/2021   HCT 34.1 (L) 11/25/2021   MCV 99.1 11/25/2021   PLT 155 11/25/2021      STUDIES: No results found.  ASSESSMENT: Smoldering myeloma  PLAN:   1. Smoldering myeloma: Bone marrow biopsy on December 23, 2011 revealed 15% plasma cells with cytogenetics having monosomy 13 in approximately 7%.  Repeat bone marrow biopsy on May 19, 2020 was essentially unchanged with approximately 15% plasma cells with monosomy 13 cytogenetics.  Her most recent skeletal survey on April 10, 2020 reviewed independently with no radiographic sign of multiple myeloma or progressive disease.  Previously, patient's M spike was relatively stable between 2.0 and 2.5.  More recently, her level has been slightly above 3.0.  Today's results is essentially unchanged at 3.4.  IgG levels also trended up, but now have stabilized just over 4000.  Today's results are 4249.  Kappa free light chains are also elevated, but unchanged at 282.3.  Her renal insufficiency has become slightly worse, but her chronic anemia is stable. No intervention is needed at this time.  Patient does not require treatment.  Return to clinic in 4 months with repeat laboratory work and further evaluation.   2. Renal  insufficiency: Creatinine slightly worse today at 2.06.  Unrelated to underlying smoldering myeloma.  Continue follow-up with nephrology as scheduled. 3.  Anemia: Chronic and unchanged.  Patient's hemoglobin is 11.2 today. 4.  Hyperkalemia: Resolved.  Appreciate dietary input.  Lloyd Huger, MD   12/01/2021 11:34 AM

## 2021-12-01 ENCOUNTER — Encounter: Payer: Self-pay | Admitting: Oncology

## 2021-12-01 ENCOUNTER — Inpatient Hospital Stay (HOSPITAL_BASED_OUTPATIENT_CLINIC_OR_DEPARTMENT_OTHER): Payer: Medicare Other | Admitting: Oncology

## 2021-12-01 VITALS — BP 122/72 | HR 81 | Temp 96.4°F | Ht 60.0 in | Wt 151.0 lb

## 2021-12-01 DIAGNOSIS — N189 Chronic kidney disease, unspecified: Secondary | ICD-10-CM | POA: Diagnosis not present

## 2021-12-01 DIAGNOSIS — D472 Monoclonal gammopathy: Secondary | ICD-10-CM | POA: Diagnosis not present

## 2021-12-01 DIAGNOSIS — R5383 Other fatigue: Secondary | ICD-10-CM | POA: Diagnosis not present

## 2021-12-01 DIAGNOSIS — E875 Hyperkalemia: Secondary | ICD-10-CM | POA: Diagnosis not present

## 2021-12-01 DIAGNOSIS — I129 Hypertensive chronic kidney disease with stage 1 through stage 4 chronic kidney disease, or unspecified chronic kidney disease: Secondary | ICD-10-CM | POA: Diagnosis not present

## 2021-12-01 DIAGNOSIS — Z8673 Personal history of transient ischemic attack (TIA), and cerebral infarction without residual deficits: Secondary | ICD-10-CM | POA: Diagnosis not present

## 2021-12-01 DIAGNOSIS — C9000 Multiple myeloma not having achieved remission: Secondary | ICD-10-CM | POA: Diagnosis not present

## 2021-12-01 DIAGNOSIS — Z809 Family history of malignant neoplasm, unspecified: Secondary | ICD-10-CM | POA: Diagnosis not present

## 2021-12-01 DIAGNOSIS — R531 Weakness: Secondary | ICD-10-CM | POA: Diagnosis not present

## 2021-12-01 DIAGNOSIS — K219 Gastro-esophageal reflux disease without esophagitis: Secondary | ICD-10-CM | POA: Diagnosis not present

## 2021-12-01 DIAGNOSIS — Z803 Family history of malignant neoplasm of breast: Secondary | ICD-10-CM | POA: Diagnosis not present

## 2021-12-15 ENCOUNTER — Other Ambulatory Visit: Payer: Self-pay | Admitting: Internal Medicine

## 2021-12-15 ENCOUNTER — Other Ambulatory Visit: Payer: Self-pay | Admitting: Family Medicine

## 2021-12-15 DIAGNOSIS — Z1231 Encounter for screening mammogram for malignant neoplasm of breast: Secondary | ICD-10-CM

## 2021-12-30 NOTE — Progress Notes (Unsigned)
12/31/2021 2:07 PM   [name] 1934-11-07 [id number]  Referring provider: Jodi Marble, MD [place],  [place] [address]  Urological history: 1. Bladder cancer -was told her cancer was stage 2 bladder cancer, dx and treated in [place].  It is unclear whether this was squamous or TCC -dx 19 years ago followed by 6 weeks BCG -last surveillance cystoscopy 11/26/2020-NED -RUS (12/2020) - WNL  2. rUTI's -contributing factors of age and vaginal atrophy -documented positive urine cultures over the last year  None  3. Cystocele -bladder lift ~ 30 years ago   Chief Complaint  Patient presents with   Follow-up     HPI: Julie Khan is a 87 y.o. female who presents today for a one year follow up.    On OAB questionnaire, she has 1-7 daytime urinations, 3 or more nighttime urinations with mild urge to urinate.  She has stress incontinence 1-2 times weekly.  She does not wear pads for the leakage.  She does not limit fluid intake.  She does not engage in toilet mapping.  Patient denies any modifying or aggravating factors.  Patient denies any dysuria or suprapubic/flank pain.  Patient denies any fevers, chills, nausea or vomiting.    She states she has been seeing drops of blood in her urine and her stool off and on over the last few months.  She said she told her PCP and her nephrologist and they said there was nothing to do.  She thought it may have been due to infection.  She states that she had a colonoscopy 4 years ago and it was fine.  She had a cystoscopy last year and it was normal.  PVR 4 mL  UA 11-30 WBC's and moderate bacteria.     PMH: Past Medical History:  Diagnosis Date   Arthritis    Bladder cancer ([place])    Bladder cancer ([place])    Cancer ([place])    multiple myeloma   Chronic kidney disease    Chronic kidney disease    Dizziness    GERD (gastroesophageal reflux disease)    Hypertension    Multiple myeloma ([place])    Stroke ([place])    tia  x 2   Tubular adenoma of colon     Surgical History: Past Surgical History:  Procedure Laterality Date   bladder tumor removed     BREAST EXCISIONAL BIOPSY Bilateral 1970   Benign   BREAST SURGERY     bx   CATARACT EXTRACTION W/PHACO Left 12/16/2016   Procedure: CATARACT EXTRACTION PHACO AND INTRAOCULAR LENS PLACEMENT ([place]);  Surgeon: Eulogio Bear, MD;  Location: [facility] ORS;  Service: Ophthalmology;  Laterality: Left;  [place] 00:38.0 AP% 14.6 CDE 5.54 Fluid pack lot # [id number]   CATARACT EXTRACTION W/PHACO Right 03/03/2017   Procedure: CATARACT EXTRACTION PHACO AND INTRAOCULAR LENS PLACEMENT (IOC);  Surgeon: Eulogio Bear, MD;  Location: [facility] ORS;  Service: Ophthalmology;  Laterality: Right;  Lot # [id number] H [place]: 00:29.8 AP%: 10.1 CDE: 3.01   COLONOSCOPY WITH PROPOFOL N/A 07/25/2015   Procedure: COLONOSCOPY WITH PROPOFOL;  Surgeon: Josefine Class, MD;  Location: [facility] ENDOSCOPY;  Service: Endoscopy;  Laterality: N/A;   COLONOSCOPY WITH PROPOFOL N/A 06/29/2017   Procedure: COLONOSCOPY WITH PROPOFOL;  Surgeon: Toledo, Benay Pike, MD;  Location: [facility] ENDOSCOPY;  Service: Gastroenterology;  Laterality: N/A;   CORONARY ANGIOGRAPHY N/A 05/24/2019   Procedure: CORONARY ANGIOGRAPHY;  Surgeon: Dionisio David, MD;  Location: [facility];  Service:  Cardiovascular;  Laterality: N/A;   ESOPHAGOGASTRODUODENOSCOPY (EGD) WITH PROPOFOL N/A 07/25/2015   Procedure: ESOPHAGOGASTRODUODENOSCOPY (EGD) WITH PROPOFOL;  Surgeon: Josefine Class, MD;  Location: [facility] ENDOSCOPY;  Service: Endoscopy;  Laterality: N/A;   ESOPHAGOGASTRODUODENOSCOPY (EGD) WITH PROPOFOL N/A 06/29/2017   Procedure: ESOPHAGOGASTRODUODENOSCOPY (EGD) WITH PROPOFOL;  Surgeon: Toledo, Benay Pike, MD;  Location: [facility] ENDOSCOPY;  Service: Gastroenterology;  Laterality: N/A;   HERNIA REPAIR     LEFT HEART CATH N/A 05/24/2019   Procedure: Left Heart Cath;  Surgeon: Dionisio David, MD;  Location: [facility] LAB;  Service:  Cardiovascular;  Laterality: N/A;    Home Medications:  Allergies as of 12/31/2021       Reactions   Iodine    Seizure Betadine ok Other reaction(s): Other (See Comments) Sneezing Seizure Betadine ok   Sucralfate Rash   Penicillins Other (See Comments), Rash   Has patient had a PCN reaction causing immediate rash, facial/tongue/throat swelling, SOB or lightheadedness with hypotension: Unknown Has patient had a PCN reaction causing severe rash involving mucus membranes or skin necrosis: Unknown Has patient had a PCN reaction that required hospitalization: Unknown Has patient had a PCN reaction occurring within the last 10 years: Unknown If all of the above answers are "NO", then may proceed with Cephalosporin use. Other reaction(s): Other (See Comments) Has patient had a PCN reaction causing immediate rash, facial/tongue/throat swelling, SOB or lightheadedness with hypotension: Unknown Has patient had a PCN reaction causing severe rash involving mucus membranes or skin necrosis: Unknown Has patient had a PCN reaction that required hospitalization: Unknown Has patient had a PCN reaction occurring within the last 10 years: Unknown If all of the above answers are "NO", then may proceed with Cephalosporin use. Has patient had a PCN reaction causing immediate rash, facial/tongue/throat swelling, SOB or lightheadedness with hypotension: Unknown Has patient had a PCN reaction causing severe rash involving mucus membranes or skin necrosis: Unknown Has patient had a PCN reaction that required hospitalization: Unknown Has patient had a PCN reaction occurring within the last 10 years: Unknown If all of the above answers are "NO", then may proceed with Cephalosporin use.        Medication List        Accurate as of December 31, 2021  2:07 PM. If you have any questions, ask your nurse or doctor.          AZO Bladder Control/Go-Less Caps   Fish Oil 1000 MG Caps Take 1 capsule by  mouth daily.   losartan 25 MG tablet Commonly known as: COZAAR Take 25 mg by mouth daily.   meclizine 25 MG tablet Commonly known as: ANTIVERT Take 25 mg by mouth 3 (three) times daily as needed.   metoprolol succinate 25 MG 24 hr tablet Commonly known as: TOPROL-XL Take 50 mg by mouth daily.   nitroGLYCERIN 0.3 MG SL tablet Commonly known as: NITROSTAT Place 1 tablet under the tongue as needed.   pantoprazole 40 MG tablet Commonly known as: PROTONIX Take by mouth.   ranolazine 500 MG 12 hr tablet Commonly known as: RANEXA Take 500 mg by mouth 2 (two) times daily.   rOPINIRole 0.25 MG tablet Commonly known as: REQUIP Take 1 tablet by mouth as needed.   rosuvastatin 10 MG tablet Commonly known as: CRESTOR Take 10 mg by mouth daily.   Vitamin D3 10 MCG (400 UNIT) Caps        Allergies:  Allergies  Allergen Reactions   Iodine  Seizure  Betadine ok Other reaction(s): Other (See Comments) Sneezing Seizure  Betadine ok   Sucralfate Rash   Penicillins Other (See Comments) and Rash    Has patient had a PCN reaction causing immediate rash, facial/tongue/throat swelling, SOB or lightheadedness with hypotension: Unknown Has patient had a PCN reaction causing severe rash involving mucus membranes or skin necrosis: Unknown Has patient had a PCN reaction that required hospitalization: Unknown Has patient had a PCN reaction occurring within the last 10 years: Unknown If all of the above answers are "NO", then may proceed with Cephalosporin use.  Other reaction(s): Other (See Comments) Has patient had a PCN reaction causing immediate rash, facial/tongue/throat swelling, SOB or lightheadedness with hypotension: Unknown Has patient had a PCN reaction causing severe rash involving mucus membranes or skin necrosis: Unknown Has patient had a PCN reaction that required hospitalization: Unknown Has patient had a PCN reaction occurring within the last 10 years: Unknown If  all of the above answers are "NO", then may proceed with Cephalosporin use. Has patient had a PCN reaction causing immediate rash, facial/tongue/throat swelling, SOB or lightheadedness with hypotension: Unknown Has patient had a PCN reaction causing severe rash involving mucus membranes or skin necrosis: Unknown Has patient had a PCN reaction that required hospitalization: Unknown Has patient had a PCN reaction occurring within the last 10 years: Unknown If all of the above answers are "NO", then may proceed with Cephalosporin use.    Family History: Family History  Problem Relation Age of Onset   Cancer Mother    Colon cancer Mother    Colon polyps Mother    Cancer Sister    Breast cancer Sister 8   Cancer Brother    Bladder Cancer Neg Hx    Kidney cancer Neg Hx     Social History:  reports that she has quit smoking. She has never used smokeless tobacco. She reports that she does not drink alcohol and does not use drugs.  ROS: Pertinent ROS in HPI  Physical Exam: BP 124/78   Pulse 83   Ht 5' (1.524 m)   Wt 151 lb (68.5 kg)   BMI 29.49 kg/m   Constitutional:  Well nourished. Alert and oriented, No acute distress. HEENT: [place] AT, moist mucus membranes.  Trachea midline, no masses. Cardiovascular: No clubbing, cyanosis, or edema. Respiratory: Normal respiratory effort, no increased work of breathing. Neurologic: Grossly intact, no focal deficits, moving all 4 extremities. Psychiatric: Normal mood and affect.    Laboratory Data:    Latest Ref Rng & Units 11/25/2021    8:59 AM 07/22/2021    1:57 PM 03/04/2021    2:33 PM  CBC  WBC 4.0 - 10.5 K/uL 3.6  4.7  5.1   Hemoglobin 12.0 - 15.0 g/dL 11.2  11.2  10.3   Hematocrit 36.0 - 46.0 % 34.1  35.1  32.3   Platelets 150 - 400 K/uL 155  175  282        Latest Ref Rng & Units 11/25/2021    8:59 AM 07/22/2021    1:57 PM 03/04/2021    2:33 PM  CMP  Glucose 70 - 99 mg/dL 120  117  110   BUN 8 - 23 mg/dL 38  34  36   Creatinine  0.44 - 1.00 mg/dL 2.06  1.74  1.72   Sodium 135 - 145 mmol/L 133  131  132   Potassium 3.5 - 5.1 mmol/L 3.8  4.7  4.0   Chloride 98 -  111 mmol/L 106  102  98   CO2 22 - 32 mmol/L 21  24  26    Calcium 8.9 - 10.3 mg/dL 8.6  8.8  9.2   Total Protein 6.5 - 8.1 g/dL 9.6  9.8  9.7   Total Bilirubin 0.3 - 1.2 mg/dL 0.8  0.5  0.5   Alkaline Phos 38 - 126 U/L 112  117  95   AST 15 - 41 U/L 34  33  30   ALT 0 - 44 U/L 18  18  17      Urinalysis See HPI and Epic I have reviewed the labs.    Pertinent Imaging:  12/31/21 13:32  Scan Result 52m    Assessment & Plan:    1. Gross hematuria -No symptoms of UTI at this visit -Today's UA is without hematuria, but has pyuria and bacteriuria -Urine culture sent, will prescribe antibiotics if it returns positive -She has iodine allergies, so we will obtain a CT scan without contrast and get her scheduled for a cystoscopy with Dr. BErlene Quanas she reports persistent, intermittent gross heme and she has a history of bladder cancer  2. Bladder cancer -cysto 11/2020 NED -no further screening needed  3. Vaginal atrophy -she is not using the estrogen cream            4. Blood in stool -CT pending -Internal hemorrhoids were found on colonoscopy performed in 2019                               Return for ct report and cysto w/ dr. bErlene Quanfor gross heme.  These notes generated with voice recognition software. I apologize for typographical errors.  SWoodbine PSanta Rosa[address] SMosineeBAlder [place] [phone]

## 2021-12-31 ENCOUNTER — Ambulatory Visit (INDEPENDENT_AMBULATORY_CARE_PROVIDER_SITE_OTHER): Payer: Medicare Other | Admitting: Urology

## 2021-12-31 ENCOUNTER — Encounter: Payer: Self-pay | Admitting: Urology

## 2021-12-31 VITALS — BP 124/78 | HR 83 | Ht 60.0 in | Wt 151.0 lb

## 2021-12-31 DIAGNOSIS — K921 Melena: Secondary | ICD-10-CM

## 2021-12-31 DIAGNOSIS — C679 Malignant neoplasm of bladder, unspecified: Secondary | ICD-10-CM

## 2021-12-31 DIAGNOSIS — N952 Postmenopausal atrophic vaginitis: Secondary | ICD-10-CM

## 2021-12-31 DIAGNOSIS — R310 Gross hematuria: Secondary | ICD-10-CM

## 2021-12-31 DIAGNOSIS — Z8551 Personal history of malignant neoplasm of bladder: Secondary | ICD-10-CM

## 2021-12-31 LAB — MICROSCOPIC EXAMINATION

## 2021-12-31 LAB — URINALYSIS, COMPLETE
Bilirubin, UA: NEGATIVE
Glucose, UA: NEGATIVE
Ketones, UA: NEGATIVE
Nitrite, UA: NEGATIVE
Protein,UA: NEGATIVE
RBC, UA: NEGATIVE
Specific Gravity, UA: 1.01 (ref 1.005–1.030)
Urobilinogen, Ur: 0.2 mg/dL (ref 0.2–1.0)
pH, UA: 5.5 (ref 5.0–7.5)

## 2021-12-31 LAB — BLADDER SCAN AMB NON-IMAGING

## 2022-01-03 LAB — CULTURE, URINE COMPREHENSIVE

## 2022-01-11 ENCOUNTER — Ambulatory Visit
Admission: RE | Admit: 2022-01-11 | Discharge: 2022-01-11 | Disposition: A | Discharge status: Home / Self Care | Payer: Medicare Other | Source: Ambulatory Visit | Attending: Urology | Admitting: Urology

## 2022-01-11 DIAGNOSIS — R310 Gross hematuria: Secondary | ICD-10-CM | POA: Insufficient documentation

## 2022-01-11 DIAGNOSIS — R319 Hematuria, unspecified: Secondary | ICD-10-CM | POA: Diagnosis not present

## 2022-01-11 DIAGNOSIS — C679 Malignant neoplasm of bladder, unspecified: Secondary | ICD-10-CM | POA: Diagnosis not present

## 2022-01-18 ENCOUNTER — Ambulatory Visit
Admission: RE | Admit: 2022-01-18 | Discharge: 2022-01-18 | Disposition: A | Discharge status: Home / Self Care | Payer: Medicare Other | Source: Ambulatory Visit | Attending: Family Medicine | Admitting: Family Medicine

## 2022-01-18 DIAGNOSIS — Z1231 Encounter for screening mammogram for malignant neoplasm of breast: Secondary | ICD-10-CM | POA: Insufficient documentation

## 2022-01-26 ENCOUNTER — Ambulatory Visit (INDEPENDENT_AMBULATORY_CARE_PROVIDER_SITE_OTHER): Payer: Medicare Other | Admitting: Urology

## 2022-01-26 DIAGNOSIS — N390 Urinary tract infection, site not specified: Secondary | ICD-10-CM

## 2022-01-26 DIAGNOSIS — N952 Postmenopausal atrophic vaginitis: Secondary | ICD-10-CM | POA: Diagnosis not present

## 2022-01-26 DIAGNOSIS — R310 Gross hematuria: Secondary | ICD-10-CM | POA: Diagnosis not present

## 2022-01-26 LAB — URINALYSIS, COMPLETE
Bilirubin, UA: NEGATIVE
Glucose, UA: NEGATIVE
Ketones, UA: NEGATIVE
Leukocytes,UA: NEGATIVE
Nitrite, UA: NEGATIVE
Protein,UA: NEGATIVE
RBC, UA: NEGATIVE
Specific Gravity, UA: 1.015 (ref 1.005–1.030)
Urobilinogen, Ur: 0.2 mg/dL (ref 0.2–1.0)
pH, UA: 5 (ref 5.0–7.5)

## 2022-01-26 LAB — MICROSCOPIC EXAMINATION

## 2022-01-26 NOTE — Progress Notes (Signed)
   01/26/22  CC:  Chief Complaint  Patient presents with   Cysto     HPI: Julie Khan is a 87 y.o.female who returns today for scheduled cytoscopy.   She was last seen in office 11/11/2020 for cystoscopy but unfortunately her urine was frankly positive with many bacteria, WBCs and nitrate positive. Showed multidrug resistant E.coli for which she was treated by her PCP, currently on macrobid.  Urinary symptoms improved on abx.  No further gross blood.  She has been treated by he PCP, Dr. Adriana Mccallum for recurrent infections over the past month.    She has a remote history of bladder cancer greater then 20 years ago. She underwent BCG x6. She stopped doing surveillance cystoscopies back in 2018. She was treated in [place].   She was last seen on 12/31/2021 by [name] at which time she noted she had a few drops of blood in her urine.  She had a CT abdomen pelvis without contrast on 01/13/2022 showing no GU pathology.   NED. A&Ox3.   No respiratory distress   Abd soft, NT, ND Normal external genitalia with patent urethral meatus  Cystoscopy Procedure Note  Patient identification was confirmed, informed consent was obtained, and patient was prepped using Betadine solution.  Lidocaine jelly was administered per urethral meatus.    Procedure: - Flexible cystoscope introduced, without any difficulty.   - Thorough search of the bladder revealed:    normal urethral meatus    normal urothelium with a stellate scar on the right lateral bladder wall; small diffuse vesicles at the dome of bladder consistent with cystitis cystica without underlying erythema or papillary change    no stones    no ulcers     no tumors    no urethral polyps    no trabeculation  - Ureteral orifices were normal in position and appearance.  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

## 2022-04-02 ENCOUNTER — Inpatient Hospital Stay: Payer: Medicare Other | Attending: Oncology

## 2022-04-02 ENCOUNTER — Other Ambulatory Visit: Payer: Self-pay

## 2022-04-02 DIAGNOSIS — D649 Anemia, unspecified: Secondary | ICD-10-CM | POA: Diagnosis not present

## 2022-04-02 DIAGNOSIS — D472 Monoclonal gammopathy: Secondary | ICD-10-CM | POA: Insufficient documentation

## 2022-04-02 DIAGNOSIS — N289 Disorder of kidney and ureter, unspecified: Secondary | ICD-10-CM | POA: Insufficient documentation

## 2022-04-02 LAB — CBC WITH DIFFERENTIAL/PLATELET
Abs Immature Granulocytes: 0.01 10*3/uL (ref 0.00–0.07)
Basophils Absolute: 0 10*3/uL (ref 0.0–0.1)
Basophils Relative: 1 %
Eosinophils Absolute: 0 10*3/uL (ref 0.0–0.5)
Eosinophils Relative: 1 %
HCT: 35.5 % — ABNORMAL LOW (ref 36.0–46.0)
Hemoglobin: 11.7 g/dL — ABNORMAL LOW (ref 12.0–15.0)
Immature Granulocytes: 0 %
Lymphocytes Relative: 35 %
Lymphs Abs: 1.4 10*3/uL (ref 0.7–4.0)
MCH: 31.1 pg (ref 26.0–34.0)
MCHC: 33 g/dL (ref 30.0–36.0)
MCV: 94.4 fL (ref 80.0–100.0)
Monocytes Absolute: 0.4 10*3/uL (ref 0.1–1.0)
Monocytes Relative: 10 %
Neutro Abs: 2.1 10*3/uL (ref 1.7–7.7)
Neutrophils Relative %: 53 %
Platelets: 180 10*3/uL (ref 150–400)
RBC: 3.76 MIL/uL — ABNORMAL LOW (ref 3.87–5.11)
RDW: 13.5 % (ref 11.5–15.5)
WBC: 4 10*3/uL (ref 4.0–10.5)
nRBC: 0 % (ref 0.0–0.2)

## 2022-04-02 LAB — COMPREHENSIVE METABOLIC PANEL
ALT: 17 U/L (ref 0–44)
AST: 34 U/L (ref 15–41)
Albumin: 3.4 g/dL — ABNORMAL LOW (ref 3.5–5.0)
Alkaline Phosphatase: 113 U/L (ref 38–126)
Anion gap: 7 (ref 5–15)
BUN: 30 mg/dL — ABNORMAL HIGH (ref 8–23)
CO2: 22 mmol/L (ref 22–32)
Calcium: 8.7 mg/dL — ABNORMAL LOW (ref 8.9–10.3)
Chloride: 106 mmol/L (ref 98–111)
Creatinine, Ser: 1.69 mg/dL — ABNORMAL HIGH (ref 0.44–1.00)
GFR, Estimated: 29 mL/min — ABNORMAL LOW (ref >60)
Glucose, Bld: 80 mg/dL (ref 70–99)
Potassium: 4.6 mmol/L (ref 3.5–5.1)
Sodium: 135 mmol/L (ref 135–145)
Total Bilirubin: 0.5 mg/dL (ref 0.3–1.2)
Total Protein: 9.8 g/dL — ABNORMAL HIGH (ref 6.5–8.1)

## 2022-04-03 LAB — IGG, IGA, IGM
IgA: 54 mg/dL — ABNORMAL LOW (ref 64–422)
IgG (Immunoglobin G), Serum: 4640 mg/dL — ABNORMAL HIGH (ref 586–1602)
IgM (Immunoglobulin M), Srm: 87 mg/dL (ref 26–217)

## 2022-04-06 LAB — KAPPA/LAMBDA LIGHT CHAINS
Kappa free light chain: 286.8 mg/L — ABNORMAL HIGH (ref 3.3–19.4)
Kappa, lambda light chain ratio: 24.1 — ABNORMAL HIGH (ref 0.26–1.65)
Lambda free light chains: 11.9 mg/L (ref 5.7–26.3)

## 2022-04-07 DIAGNOSIS — I10 Essential (primary) hypertension: Secondary | ICD-10-CM | POA: Diagnosis not present

## 2022-04-07 DIAGNOSIS — N184 Chronic kidney disease, stage 4 (severe): Secondary | ICD-10-CM | POA: Diagnosis not present

## 2022-04-07 DIAGNOSIS — I129 Hypertensive chronic kidney disease with stage 1 through stage 4 chronic kidney disease, or unspecified chronic kidney disease: Secondary | ICD-10-CM | POA: Diagnosis not present

## 2022-04-07 DIAGNOSIS — E875 Hyperkalemia: Secondary | ICD-10-CM | POA: Diagnosis not present

## 2022-04-07 DIAGNOSIS — N2581 Secondary hyperparathyroidism of renal origin: Secondary | ICD-10-CM | POA: Diagnosis not present

## 2022-04-08 ENCOUNTER — Encounter: Payer: Self-pay | Admitting: Oncology

## 2022-04-08 ENCOUNTER — Inpatient Hospital Stay: Payer: Medicare Other | Attending: Oncology | Admitting: Oncology

## 2022-04-08 VITALS — BP 121/77 | HR 87 | Temp 97.0°F | Resp 16 | Wt 156.0 lb

## 2022-04-08 DIAGNOSIS — Z86010 Personal history of colonic polyps: Secondary | ICD-10-CM | POA: Diagnosis not present

## 2022-04-08 DIAGNOSIS — Z8673 Personal history of transient ischemic attack (TIA), and cerebral infarction without residual deficits: Secondary | ICD-10-CM | POA: Diagnosis not present

## 2022-04-08 DIAGNOSIS — Z8551 Personal history of malignant neoplasm of bladder: Secondary | ICD-10-CM | POA: Insufficient documentation

## 2022-04-08 DIAGNOSIS — Z800 Family history of malignant neoplasm of digestive organs: Secondary | ICD-10-CM | POA: Diagnosis not present

## 2022-04-08 DIAGNOSIS — Z803 Family history of malignant neoplasm of breast: Secondary | ICD-10-CM | POA: Diagnosis not present

## 2022-04-08 DIAGNOSIS — I129 Hypertensive chronic kidney disease with stage 1 through stage 4 chronic kidney disease, or unspecified chronic kidney disease: Secondary | ICD-10-CM | POA: Insufficient documentation

## 2022-04-08 DIAGNOSIS — D631 Anemia in chronic kidney disease: Secondary | ICD-10-CM | POA: Diagnosis not present

## 2022-04-08 DIAGNOSIS — K219 Gastro-esophageal reflux disease without esophagitis: Secondary | ICD-10-CM | POA: Diagnosis not present

## 2022-04-08 DIAGNOSIS — D472 Monoclonal gammopathy: Secondary | ICD-10-CM | POA: Insufficient documentation

## 2022-04-08 DIAGNOSIS — N189 Chronic kidney disease, unspecified: Secondary | ICD-10-CM | POA: Insufficient documentation

## 2022-04-08 DIAGNOSIS — Z79899 Other long term (current) drug therapy: Secondary | ICD-10-CM | POA: Diagnosis not present

## 2022-04-08 NOTE — Progress Notes (Signed)
Pt doing well. Return from long trip home to [place]. Fatigued all the time.Has arthritis in hands.

## 2022-04-08 NOTE — Progress Notes (Signed)
[place]  [phone] [phone]) [phone]  ID: Eustace Pen OB: 06-Dec-1934  MR#: [id number]  XVQ#:[id number]  Patient Care Team: Dion Body, MD as PCP - General (Family Medicine) Lloyd Huger, MD as Consulting Physician (Oncology)   CHIEF COMPLAINT: Smoldering myeloma  INTERVAL HISTORY: Patient returns to clinic today for repeat laboratory work and routine 47-monthevaluation.  [place]  She also had a younger brother who recently died with what appears to be rapidly progressive myeloma.  She otherwise feels well.  She denies any weakness or fatigue.  She has no neurologic complaints.  She denies any bony pain.  She denies any recent fevers or illnesses.  She has a good appetite and denies weight loss.  She denies any chest pain, shortness of breath, cough, or hemoptysis.  She denies any nausea, vomiting, constipation, or diarrhea.  Patient offers no further specific complaints today.  REVIEW OF SYSTEMS:   Review of Systems  Constitutional: Negative.  Negative for fever, malaise/fatigue and weight loss.  Respiratory: Negative.  Negative for cough and shortness of breath.   Cardiovascular: Negative.  Negative for chest pain and leg swelling.  Gastrointestinal: Negative.  Negative for abdominal pain, blood in stool and melena.  Genitourinary: Negative.  Negative for dysuria.  Musculoskeletal: Negative.  Negative for back pain.  Skin: Negative.  Negative for rash.  Neurological: Negative.  Negative for sensory change, focal weakness and weakness.  Psychiatric/Behavioral: Negative.  The patient is not nervous/anxious.     As per HPI. Otherwise, a complete review of systems is negative.  PAST MEDICAL HISTORY: Past Medical History:  Diagnosis Date   Arthritis    Bladder cancer ([place]    Bladder cancer ([place]    Cancer ([place]    multiple myeloma   Chronic kidney disease    Chronic kidney disease     Dizziness    GERD (gastroesophageal reflux disease)    Hypertension    Multiple myeloma ([place]    Stroke ([place]    tia x 2   Tubular adenoma of colon     PAST SURGICAL HISTORY: Past Surgical History:  Procedure Laterality Date   bladder tumor removed     BREAST EXCISIONAL BIOPSY Bilateral 1970   Benign   BREAST SURGERY     bx   CATARACT EXTRACTION W/PHACO Left 12/16/2016   Procedure: CATARACT EXTRACTION PHACO AND INTRAOCULAR LENS PLACEMENT ([place];  Surgeon: KEulogio Bear MD;  Location: [facility] ORS;  Service: Ophthalmology;  Laterality: Left;  [place]:38.0 AP% 14.6 CDE 5.54 Fluid pack lot # [id number]   CATARACT EXTRACTION W/PHACO Right 03/03/2017   Procedure: CATARACT EXTRACTION PHACO AND INTRAOCULAR LENS PLACEMENT (IOC);  Surgeon: KEulogio Bear MD;  Location: [facility] ORS;  Service: Ophthalmology;  Laterality: Right;  Lot # [id number] [place] 00:29.8 AP%: 10.1 CDE: 3.01   COLONOSCOPY WITH PROPOFOL N/A 07/25/2015   Procedure: COLONOSCOPY WITH PROPOFOL;  Surgeon: MJosefine Class MD;  [facility];  Service: Endoscopy;  Laterality: N/A;   COLONOSCOPY WITH PROPOFOL N/A 06/29/2017   Procedure: COLONOSCOPY WITH PROPOFOL;  Surgeon: Toledo, TBenay Pike MD;  Location: [facility] ENDOSCOPY;  Service: Gastroenterology;  Laterality: N/A;   CORONARY ANGIOGRAPHY N/A 05/24/2019   Procedure: CORONARY ANGIOGRAPHY;  Surgeon: KDionisio David MD;  [facility];  Service: Cardiovascular;  Laterality: N/A;   ESOPHAGOGASTRODUODENOSCOPY (EGD) WITH PROPOFOL N/A 07/25/2015   Procedure: ESOPHAGOGASTRODUODENOSCOPY (EGD) WITH PROPOFOL;  Surgeon: MJosefine Class MD;  Location: [facility] ENDOSCOPY;  Service: Endoscopy;  Laterality: N/A;   ESOPHAGOGASTRODUODENOSCOPY (EGD) WITH PROPOFOL N/A 06/29/2017   Procedure: ESOPHAGOGASTRODUODENOSCOPY (EGD) WITH PROPOFOL;  Surgeon: Toledo, Benay Pike, MD;  Location: [facility] ENDOSCOPY;  Service: Gastroenterology;  Laterality: N/A;   HERNIA REPAIR     LEFT  HEART CATH N/A 05/24/2019   Procedure: Left Heart Cath;  Surgeon: Dionisio David, MD;  Location: [place] [facility] LAB;  Service: Cardiovascular;  Laterality: N/A;    FAMILY HISTORY: Family History  Problem Relation Age of Onset   Cancer Mother    Colon cancer Mother    Colon polyps Mother    Cancer Sister    Breast cancer Sister 86   Cancer Brother    Bladder Cancer Neg Hx    Kidney cancer Neg Hx     ADVANCED DIRECTIVES (Y/N):  N  HEALTH MAINTENANCE: Social History   Tobacco Use   Smoking status: Former   Smokeless tobacco: Never  Scientific laboratory technician Use: Never used  Substance Use Topics   Alcohol use: No   Drug use: No     Colonoscopy:  PAP:  Bone density:  Lipid panel:  Allergies  Allergen Reactions   Iodine     Seizure  Betadine ok Other reaction(s): Other (See Comments) Sneezing Seizure  Betadine ok   Sucralfate Rash   Penicillins Other (See Comments) and Rash    Has patient had a PCN reaction causing immediate rash, facial/tongue/throat swelling, SOB or lightheadedness with hypotension: Unknown Has patient had a PCN reaction causing severe rash involving mucus membranes or skin necrosis: Unknown Has patient had a PCN reaction that required hospitalization: Unknown Has patient had a PCN reaction occurring within the last 10 years: Unknown If all of the above answers are "NO", then may proceed with Cephalosporin use.  Other reaction(s): Other (See Comments) Has patient had a PCN reaction causing immediate rash, facial/tongue/throat swelling, SOB or lightheadedness with hypotension: Unknown Has patient had a PCN reaction causing severe rash involving mucus membranes or skin necrosis: Unknown Has patient had a PCN reaction that required hospitalization: Unknown Has patient had a PCN reaction occurring within the last 10 years: Unknown If all of the above answers are "NO", then may proceed with Cephalosporin use. Has patient had a PCN reaction causing  immediate rash, facial/tongue/throat swelling, SOB or lightheadedness with hypotension: Unknown Has patient had a PCN reaction causing severe rash involving mucus membranes or skin necrosis: Unknown Has patient had a PCN reaction that required hospitalization: Unknown Has patient had a PCN reaction occurring within the last 10 years: Unknown If all of the above answers are "NO", then may proceed with Cephalosporin use.    Current Outpatient Medications  Medication Sig Dispense Refill   Cholecalciferol (VITAMIN D3) 10 MCG (400 UNIT) CAPS      meclizine (ANTIVERT) 25 MG tablet Take 25 mg by mouth 3 (three) times daily as needed.  1   metoprolol succinate (TOPROL-XL) 25 MG 24 hr tablet Take 50 mg by mouth daily.      nitroGLYCERIN (NITROSTAT) 0.3 MG SL tablet Place 1 tablet under the tongue as needed.     pantoprazole (PROTONIX) 40 MG tablet Take by mouth.     ranolazine (RANEXA) 500 MG 12 hr tablet Take 500 mg by mouth 2 (two) times daily.     rOPINIRole (REQUIP) 0.25 MG tablet Take 1 tablet by mouth as needed.     rosuvastatin (CRESTOR) 10 MG tablet Take 10 mg by mouth  daily.     No current facility-administered medications for this visit.   Facility-Administered Medications Ordered in Other Visits  Medication Dose Route Frequency Provider Last Rate Last Admin   sodium chloride flush (NS) 0.9 % injection 3 mL  3 mL Intravenous Q12H Neoma Laming A, MD        OBJECTIVE: Vitals:   04/08/22 1048  BP: 121/77  Pulse: 87  Resp: 16  Temp: (!) 97 F (36.1 C)  SpO2: 100%     Body mass index is 30.47 kg/m.    ECOG FS:0 - Asymptomatic  General: Well-developed, well-nourished, no acute distress. Eyes: Pink conjunctiva, anicteric sclera. HEENT: Normocephalic, moist mucous membranes. Lungs: No audible wheezing or coughing. Heart: Regular rate and rhythm. Abdomen: Soft, nontender, no obvious distention. Musculoskeletal: No edema, cyanosis, or clubbing. Neuro: Alert, answering all  questions appropriately. Cranial nerves grossly intact. Skin: No rashes or petechiae noted. Psych: Normal affect.  LAB RESULTS:  Lab Results  Component Value Date   NA 135 04/02/2022   K 4.6 04/02/2022   CL 106 04/02/2022   CO2 22 04/02/2022   GLUCOSE 80 04/02/2022   BUN 30 (H) 04/02/2022   CREATININE 1.69 (H) 04/02/2022   CALCIUM 8.7 (L) 04/02/2022   PROT 9.8 (H) 04/02/2022   ALBUMIN 3.4 (L) 04/02/2022   AST 34 04/02/2022   ALT 17 04/02/2022   ALKPHOS 113 04/02/2022   BILITOT 0.5 04/02/2022   GFRNONAA 29 (L) 04/02/2022   GFRAA 33 (L) 11/19/2019    Lab Results  Component Value Date   WBC 4.0 04/02/2022   NEUTROABS 2.1 04/02/2022   HGB 11.7 (L) 04/02/2022   HCT 35.5 (L) 04/02/2022   MCV 94.4 04/02/2022   PLT 180 04/02/2022      STUDIES: No results found.  ASSESSMENT: Smoldering myeloma  PLAN:   1. Smoldering myeloma: Bone marrow biopsy on December 23, 2011 revealed 15% plasma cells with cytogenetics having monosomy 13 in approximately 7%.  Repeat bone marrow biopsy on May 19, 2020 was essentially unchanged with approximately 15% plasma cells with monosomy 13 cytogenetics.  Her most recent skeletal survey on April 10, 2020 reviewed independently with no radiographic sign of multiple myeloma or progressive disease.  Previously, patient's M spike was relatively stable between 2.0 and 2.5.  More recently, her level was reported at 3.4.  Today's result is pending.  IgG levels also trended up, but now have stabilized just over 4000.  Her most recent result is 4640.  Kappa free light chains are also elevated, but unchanged at 286.8.  Her renal insufficiency and anemia are unchanged. No intervention is needed at this time.  Patient does not require treatment.  Return to clinic in 4 months with repeat laboratory work and further evaluation. 2. Renal insufficiency: Chronic and unchanged.  Patient's most recent creatinine is 1.69.  Unrelated to underlying smoldering myeloma.   Continue follow-up with nephrology as scheduled. 3.  Anemia: Chronic and unchanged.  Patient's hemoglobin is mildly improved to 11.7 today.   4.  Hyperkalemia: Resolved.  Lloyd Huger, MD   04/08/2022 1:36 PM

## 2022-04-09 ENCOUNTER — Ambulatory Visit: Payer: Medicare Other | Admitting: Oncology

## 2022-04-09 LAB — MULTIPLE MYELOMA PANEL, SERUM
Albumin SerPl Elph-Mcnc: 3.3 g/dL (ref 2.9–4.4)
Albumin/Glob SerPl: 0.6 — ABNORMAL LOW (ref 0.7–1.7)
Alpha 1: 0.2 g/dL (ref 0.0–0.4)
Alpha2 Glob SerPl Elph-Mcnc: 0.6 g/dL (ref 0.4–1.0)
B-Globulin SerPl Elph-Mcnc: 1 g/dL (ref 0.7–1.3)
Gamma Glob SerPl Elph-Mcnc: 3.9 g/dL — ABNORMAL HIGH (ref 0.4–1.8)
Globulin, Total: 5.8 g/dL — ABNORMAL HIGH (ref 2.2–3.9)
IgA: 55 mg/dL — ABNORMAL LOW (ref 64–422)
IgG (Immunoglobin G), Serum: 4500 mg/dL — ABNORMAL HIGH (ref 586–1602)
IgM (Immunoglobulin M), Srm: 88 mg/dL (ref 26–217)
M Protein SerPl Elph-Mcnc: 3.5 g/dL — ABNORMAL HIGH
Total Protein ELP: 9.1 g/dL — ABNORMAL HIGH (ref 6.0–8.5)

## 2022-04-14 DIAGNOSIS — N184 Chronic kidney disease, stage 4 (severe): Secondary | ICD-10-CM | POA: Diagnosis not present

## 2022-04-14 DIAGNOSIS — N2581 Secondary hyperparathyroidism of renal origin: Secondary | ICD-10-CM | POA: Diagnosis not present

## 2022-04-14 DIAGNOSIS — I10 Essential (primary) hypertension: Secondary | ICD-10-CM | POA: Diagnosis not present

## 2022-04-14 DIAGNOSIS — C9001 Multiple myeloma in remission: Secondary | ICD-10-CM | POA: Diagnosis not present

## 2022-04-14 DIAGNOSIS — Z961 Presence of intraocular lens: Secondary | ICD-10-CM | POA: Diagnosis not present

## 2022-04-14 DIAGNOSIS — I129 Hypertensive chronic kidney disease with stage 1 through stage 4 chronic kidney disease, or unspecified chronic kidney disease: Secondary | ICD-10-CM | POA: Diagnosis not present

## 2022-04-14 DIAGNOSIS — D631 Anemia in chronic kidney disease: Secondary | ICD-10-CM | POA: Diagnosis not present

## 2022-04-14 DIAGNOSIS — E875 Hyperkalemia: Secondary | ICD-10-CM | POA: Diagnosis not present

## 2022-04-15 DIAGNOSIS — I2510 Atherosclerotic heart disease of native coronary artery without angina pectoris: Secondary | ICD-10-CM | POA: Diagnosis not present

## 2022-04-15 DIAGNOSIS — I10 Essential (primary) hypertension: Secondary | ICD-10-CM | POA: Diagnosis not present

## 2022-04-15 DIAGNOSIS — E782 Mixed hyperlipidemia: Secondary | ICD-10-CM | POA: Diagnosis not present

## 2022-04-15 DIAGNOSIS — I340 Nonrheumatic mitral (valve) insufficiency: Secondary | ICD-10-CM | POA: Diagnosis not present

## 2022-05-24 DIAGNOSIS — I129 Hypertensive chronic kidney disease with stage 1 through stage 4 chronic kidney disease, or unspecified chronic kidney disease: Secondary | ICD-10-CM | POA: Diagnosis not present

## 2022-05-24 DIAGNOSIS — N184 Chronic kidney disease, stage 4 (severe): Secondary | ICD-10-CM | POA: Diagnosis not present

## 2022-05-24 DIAGNOSIS — M19042 Primary osteoarthritis, left hand: Secondary | ICD-10-CM | POA: Diagnosis not present

## 2022-05-24 DIAGNOSIS — M19041 Primary osteoarthritis, right hand: Secondary | ICD-10-CM | POA: Diagnosis not present

## 2022-05-24 DIAGNOSIS — I2510 Atherosclerotic heart disease of native coronary artery without angina pectoris: Secondary | ICD-10-CM | POA: Diagnosis not present

## 2022-05-24 DIAGNOSIS — Z8719 Personal history of other diseases of the digestive system: Secondary | ICD-10-CM | POA: Diagnosis not present

## 2022-05-25 DIAGNOSIS — I2510 Atherosclerotic heart disease of native coronary artery without angina pectoris: Secondary | ICD-10-CM | POA: Diagnosis not present

## 2022-06-10 DIAGNOSIS — K219 Gastro-esophageal reflux disease without esophagitis: Secondary | ICD-10-CM | POA: Diagnosis not present

## 2022-06-10 DIAGNOSIS — N184 Chronic kidney disease, stage 4 (severe): Secondary | ICD-10-CM | POA: Diagnosis not present

## 2022-06-10 DIAGNOSIS — I2510 Atherosclerotic heart disease of native coronary artery without angina pectoris: Secondary | ICD-10-CM | POA: Diagnosis not present

## 2022-06-10 DIAGNOSIS — E785 Hyperlipidemia, unspecified: Secondary | ICD-10-CM | POA: Diagnosis not present

## 2022-06-10 DIAGNOSIS — R079 Chest pain, unspecified: Secondary | ICD-10-CM | POA: Diagnosis not present

## 2022-06-10 DIAGNOSIS — I10 Essential (primary) hypertension: Secondary | ICD-10-CM | POA: Diagnosis not present

## 2022-06-11 ENCOUNTER — Other Ambulatory Visit: Payer: Self-pay

## 2022-06-11 DIAGNOSIS — Z8719 Personal history of other diseases of the digestive system: Secondary | ICD-10-CM | POA: Diagnosis not present

## 2022-06-11 DIAGNOSIS — I2510 Atherosclerotic heart disease of native coronary artery without angina pectoris: Secondary | ICD-10-CM

## 2022-06-11 DIAGNOSIS — R079 Chest pain, unspecified: Secondary | ICD-10-CM

## 2022-06-21 DIAGNOSIS — I2510 Atherosclerotic heart disease of native coronary artery without angina pectoris: Secondary | ICD-10-CM | POA: Diagnosis not present

## 2022-06-21 DIAGNOSIS — R0789 Other chest pain: Secondary | ICD-10-CM | POA: Diagnosis not present

## 2022-07-06 DIAGNOSIS — R0602 Shortness of breath: Secondary | ICD-10-CM | POA: Diagnosis not present

## 2022-07-06 DIAGNOSIS — N184 Chronic kidney disease, stage 4 (severe): Secondary | ICD-10-CM | POA: Diagnosis not present

## 2022-07-06 DIAGNOSIS — C9001 Multiple myeloma in remission: Secondary | ICD-10-CM | POA: Diagnosis not present

## 2022-07-06 DIAGNOSIS — G479 Sleep disorder, unspecified: Secondary | ICD-10-CM | POA: Diagnosis not present

## 2022-07-06 DIAGNOSIS — N170 Acute kidney failure with tubular necrosis: Secondary | ICD-10-CM | POA: Diagnosis not present

## 2022-07-08 DIAGNOSIS — K625 Hemorrhage of anus and rectum: Secondary | ICD-10-CM | POA: Diagnosis not present

## 2022-07-12 ENCOUNTER — Ambulatory Visit: Admission: RE | Admit: 2022-07-12 | Payer: 59 | Source: Ambulatory Visit

## 2022-07-13 ENCOUNTER — Encounter: Payer: Self-pay | Admitting: Internal Medicine

## 2022-07-14 ENCOUNTER — Ambulatory Visit: Payer: 59 | Admitting: General Practice

## 2022-07-14 ENCOUNTER — Encounter: Payer: Self-pay | Admitting: Internal Medicine

## 2022-07-14 ENCOUNTER — Other Ambulatory Visit: Payer: Self-pay

## 2022-07-14 ENCOUNTER — Ambulatory Visit
Admission: RE | Admit: 2022-07-14 | Discharge: 2022-07-14 | Disposition: A | Discharge status: Home / Self Care | Payer: 59 | Attending: Internal Medicine | Admitting: Internal Medicine

## 2022-07-14 ENCOUNTER — Encounter
Admission: RE | Disposition: A | Discharge status: Home / Self Care | Payer: Self-pay | Source: Home / Self Care | Attending: Internal Medicine

## 2022-07-14 DIAGNOSIS — Z8551 Personal history of malignant neoplasm of bladder: Secondary | ICD-10-CM | POA: Diagnosis not present

## 2022-07-14 DIAGNOSIS — K625 Hemorrhage of anus and rectum: Secondary | ICD-10-CM | POA: Diagnosis not present

## 2022-07-14 DIAGNOSIS — K219 Gastro-esophageal reflux disease without esophagitis: Secondary | ICD-10-CM | POA: Diagnosis not present

## 2022-07-14 DIAGNOSIS — K640 First degree hemorrhoids: Secondary | ICD-10-CM | POA: Insufficient documentation

## 2022-07-14 DIAGNOSIS — I129 Hypertensive chronic kidney disease with stage 1 through stage 4 chronic kidney disease, or unspecified chronic kidney disease: Secondary | ICD-10-CM | POA: Diagnosis not present

## 2022-07-14 DIAGNOSIS — Z86010 Personal history of colonic polyps: Secondary | ICD-10-CM | POA: Insufficient documentation

## 2022-07-14 DIAGNOSIS — K921 Melena: Secondary | ICD-10-CM | POA: Insufficient documentation

## 2022-07-14 DIAGNOSIS — K648 Other hemorrhoids: Secondary | ICD-10-CM | POA: Diagnosis not present

## 2022-07-14 DIAGNOSIS — Z8673 Personal history of transient ischemic attack (TIA), and cerebral infarction without residual deficits: Secondary | ICD-10-CM | POA: Diagnosis not present

## 2022-07-14 DIAGNOSIS — N189 Chronic kidney disease, unspecified: Secondary | ICD-10-CM | POA: Insufficient documentation

## 2022-07-14 DIAGNOSIS — K5730 Diverticulosis of large intestine without perforation or abscess without bleeding: Secondary | ICD-10-CM | POA: Insufficient documentation

## 2022-07-14 HISTORY — PX: COLONOSCOPY WITH PROPOFOL: SHX5780

## 2022-07-14 SURGERY — COLONOSCOPY WITH PROPOFOL
Anesthesia: General

## 2022-07-14 MED ORDER — LIDOCAINE HCL (PF) 2 % IJ SOLN
INTRAMUSCULAR | Status: AC
  Filled 2022-07-14: qty 5

## 2022-07-14 MED ORDER — LIDOCAINE HCL (PF) 2 % IJ SOLN
INTRAMUSCULAR | Status: DC | PRN
  Administered 2022-07-14: 30 mg via INTRADERMAL

## 2022-07-14 MED ORDER — PROPOFOL 10 MG/ML IV BOLUS
INTRAVENOUS | Status: AC
  Filled 2022-07-14: qty 20

## 2022-07-14 MED ORDER — SODIUM CHLORIDE 0.9 % IV SOLN: INTRAVENOUS | Status: DC

## 2022-07-14 MED ORDER — PROPOFOL 10 MG/ML IV BOLUS
INTRAVENOUS | Status: DC | PRN
  Administered 2022-07-14 (×2): 20 mg via INTRAVENOUS
  Administered 2022-07-14: 50 mg via INTRAVENOUS
  Administered 2022-07-14: 30 mg via INTRAVENOUS
  Administered 2022-07-14 (×4): 20 mg via INTRAVENOUS

## 2022-07-14 NOTE — Transfer of Care (Signed)
Immediate Anesthesia Transfer of Care Note  Patient: Julie Khan  Procedure(s) Performed: COLONOSCOPY WITH PROPOFOL  Patient Location: PACU and Endoscopy Unit  Anesthesia Type:MAC  Level of Consciousness: drowsy  Airway & Oxygen Therapy: Patient Spontanous Breathing and Patient connected to nasal cannula oxygen  Post-op Assessment: Report given to RN and Post -op Vital signs reviewed and stable  Post vital signs: Reviewed and stable  Last Vitals:  Vitals Value Taken Time  BP    Temp    Pulse 89 07/14/22 1201  Resp 24 07/14/22 1201  SpO2 94 % 07/14/22 1201    Last Pain:  Vitals:   07/14/22 1051  TempSrc: Temporal  PainSc: 0-No pain         Complications: No notable events documented.

## 2022-07-14 NOTE — H&P (Signed)
Outpatient short stay form Pre-procedure 07/14/2022 11:28 AM Julie Khan, M.D.  Primary Physician: Julie Khan, M.D.  Reason for visit:  Rectal bleeding, change in bowel habits, diarrhea  History of present illness:  Julie Khan reports over the past 10 days having issues with rectal bleeding, she reports she had hemorrhoid "injections" in her 60s and this improved symptoms and she has had some on and off blood in stool over the past year with some rectal itching but feels over the past 2 weeks it has been worse. She endorses some blood in stool off and on over the past 2 weeks. She has had very mild diarrhea more recently but endorses long history of alternating diarrhea and constipation. Currently having 2-3 small loose stools per day. She feels some mild lower abdominal pressure but denies any abdominal pain.  She denies any nausea or vomiting. She does have some GERD symptoms and takes Protonix 40 mg once a day for symptoms. She denies any dysphagia. She denies NSAIDs tobacco and alcohol. Appetite good and weight stable.   Previous GI studies:  -- 07/25/15: EGD (indic: epigastric pain) - Small hiatal hernia, otherwise normal. : CSY (indic: FHx CRC) - Few small peri-anal ulcers (? Prep artifact). TWo 3-4 mm polyps in transverse colon. Sigmoid diverticulosis. Iinternal hemorrhoids. No repeat recommended due to age. -- 06/29/17: EGD (indic: GI bleed) - small hiatal hernia, otherwise normal. : CSY (indic: hematochezia) - sigmoid diverticulosis, internal hemorrhoids. -- 07/12/17: VCE (indic: IDA) - Few non-bleeding telangiectasis in proximal to mid-jejunum.     Current Facility-Administered Medications:    0.9 %  sodium chloride infusion, , Intravenous, Continuous, Julie Khan, Julie Nearing, MD, Last Rate: 20 mL/hr at 07/14/22 1125, Continued from Pre-op at 07/14/22 1125  Facility-Administered Medications Ordered in Other Encounters:    sodium chloride flush (NS) 0.9 % injection 3 mL, 3 mL,  Intravenous, Q12H, Julie Nancy, MD  Medications Prior to Admission  Medication Sig Dispense Refill Last Dose   metoprolol succinate (TOPROL-XL) 25 MG 24 hr tablet Take 50 mg by mouth daily.    07/13/2022   pantoprazole (PROTONIX) 40 MG tablet Take by mouth.   07/12/2022   Cholecalciferol (VITAMIN D3) 10 MCG (400 UNIT) CAPS    07/12/2022   meclizine (ANTIVERT) 25 MG tablet Take 25 mg by mouth 3 (three) times daily as needed.  1    nitroGLYCERIN (NITROSTAT) 0.3 MG SL tablet Place 1 tablet under the tongue as needed.      ranolazine (RANEXA) 500 MG 12 hr tablet Take 500 mg by mouth 2 (two) times daily. (Patient not taking: Reported on 07/14/2022)   Not Taking   rOPINIRole (REQUIP) 0.25 MG tablet Take 1 tablet by mouth as needed.   07/12/2022   rosuvastatin (CRESTOR) 10 MG tablet Take 10 mg by mouth daily.   07/12/2022     Allergies  Allergen Reactions   Iodine     Seizure  Betadine ok Other reaction(s): Other (See Comments) Sneezing Seizure  Betadine ok   Sucralfate Rash   Penicillins Other (See Comments) and Rash    Has patient had a PCN reaction causing immediate rash, facial/tongue/throat swelling, SOB or lightheadedness with hypotension: Unknown Has patient had a PCN reaction causing severe rash involving mucus membranes or skin necrosis: Unknown Has patient had a PCN reaction that required hospitalization: Unknown Has patient had a PCN reaction occurring within the last 10 years: Unknown If all of the above answers are "NO", then may proceed with Cephalosporin  use.  Other reaction(s): Other (See Comments) Has patient had a PCN reaction causing immediate rash, facial/tongue/throat swelling, SOB or lightheadedness with hypotension: Unknown Has patient had a PCN reaction causing severe rash involving mucus membranes or skin necrosis: Unknown Has patient had a PCN reaction that required hospitalization: Unknown Has patient had a PCN reaction occurring within the last 10 years:  Unknown If all of the above answers are "NO", then may proceed with Cephalosporin use. Has patient had a PCN reaction causing immediate rash, facial/tongue/throat swelling, SOB or lightheadedness with hypotension: Unknown Has patient had a PCN reaction causing severe rash involving mucus membranes or skin necrosis: Unknown Has patient had a PCN reaction that required hospitalization: Unknown Has patient had a PCN reaction occurring within the last 10 years: Unknown If all of the above answers are "NO", then may proceed with Cephalosporin use.     Past Medical History:  Diagnosis Date   Arthritis    Bladder cancer    Bladder cancer    Cancer    multiple myeloma   Chronic kidney disease    Chronic kidney disease    Dizziness    GERD (gastroesophageal reflux disease)    Hypertension    Multiple myeloma    Stroke    tia x 2   Tubular adenoma of colon     Review of systems:  Otherwise negative.    Physical Exam  Gen: Alert, oriented. Appears stated age.  HEENT: [place]/AT. PERRLA. Lungs: CTA, no wheezes. CV: RR nl S1, S2. Abd: soft, benign, no masses. BS+ Ext: No edema. Pulses 2+    Planned procedures: Proceed with colonoscopy. The patient understands the nature of the planned procedure, indications, risks, alternatives and potential complications including but not limited to bleeding, infection, perforation, damage to internal organs and possible oversedation/side effects from anesthesia. The patient agrees and gives consent to proceed.  Please refer to procedure notes for findings, recommendations and patient disposition/instructions.     Julie Khan, M.D. Gastroenterology 07/14/2022  11:28 AM

## 2022-07-14 NOTE — Op Note (Signed)
[facility] Gastroenterology Patient Name: Julie Khan Procedure Date: 07/14/2022 11:41 AM MRN: [id number] Account #: [id number] Date of Birth: Sep 09, 1934 Admit Type: Outpatient Age: 88 Room: [facility] ENDO ROOM 2 Gender: Female Note Status: Finalized Instrument Name: Nelda Marseille [id number] Procedure:             Colonoscopy Indications:           Hematochezia Providers:             Boykin Nearing. Sherrell Farish MD, MD Medicines:             Propofol per Anesthesia Complications:         No immediate complications. Procedure:             Pre-Anesthesia Assessment:                        - The risks and benefits of the procedure and the                         sedation options and risks were discussed with the                         patient. All questions were answered and informed                         consent was obtained.                        - Patient identification and proposed procedure were                         verified prior to the procedure by the nurse. The                         procedure was verified in the procedure room.                        - ASA Grade Assessment: III - A patient with severe                         systemic disease.                        - After reviewing the risks and benefits, the patient                         was deemed in satisfactory condition to undergo the                         procedure.                        After obtaining informed consent, the colonoscope was                         passed under direct vision. Throughout the procedure,                         the patient's blood pressure, pulse, and oxygen  saturations were monitored continuously. The                         Colonoscope was introduced through the anus and                         advanced to the the cecum, identified by appendiceal                         orifice and ileocecal valve. The colonoscopy was                          performed without difficulty. The patient tolerated                         the procedure well. The quality of the bowel                         preparation was good. The ileocecal valve, appendiceal                         orifice, and rectum were photographed. Findings:      The perianal and digital rectal examinations were normal. Pertinent       negatives include normal sphincter tone and no palpable rectal lesions.      Non-bleeding internal hemorrhoids were found during retroflexion. The       hemorrhoids were Grade I (internal hemorrhoids that do not prolapse).      Many large-mouthed and small-mouthed diverticula were found in the       sigmoid colon. There was no evidence of diverticular bleeding.      The exam was otherwise without abnormality. Impression:            - Non-bleeding internal hemorrhoids.                        - Mild diverticulosis in the sigmoid colon. There was                         no evidence of diverticular bleeding.                        - The examination was otherwise normal.                        - No specimens collected. Recommendation:        - Patient has a contact number available for                         emergencies. The signs and symptoms of potential                         delayed complications were discussed with the patient.                         Return to normal activities tomorrow. Written                         discharge instructions were provided to the patient.                        -  High fiber diet.                        - Continue present medications.                        - NO repeat colonoscopy due to age/comorbid status                        - Return to GI office PRN.                        - The findings and recommendations were discussed with                         the patient. Procedure Code(s):     --- Professional ---                        [phone], Colonoscopy, flexible; diagnostic, including                          collection of specimen(s) by brushing or washing, when                         performed (separate procedure) Diagnosis Code(s):     --- Professional ---                        K64.0, First degree hemorrhoids                        K92.1, Melena (includes Hematochezia)                        K57.30, Diverticulosis of large intestine without                         perforation or abscess without bleeding CPT copyright 2022 [employer]. All rights reserved. The codes documented in this report are preliminary and upon coder review may  be revised to meet current compliance requirements. Stanton Kidney MD, MD 07/14/2022 11:59:45 AM This report has been signed electronically. Number of Addenda: 0 Note Initiated On: 07/14/2022 11:41 AM Scope Withdrawal Time: 0 hours 4 minutes 26 seconds  Total Procedure Duration: 0 hours 10 minutes 13 seconds  Estimated Blood Loss:  Estimated blood loss: none.      [facility]

## 2022-07-14 NOTE — Anesthesia Preprocedure Evaluation (Signed)
Anesthesia Evaluation  Patient identified by MRN, date of birth, ID band Patient awake    Reviewed: Allergy & Precautions, H&P , NPO status , Patient's Chart, lab work & pertinent test results  History of Anesthesia Complications Negative for: history of anesthetic complications  Airway Mallampati: III  TM Distance: <3 FB Neck ROM: limited    Dental  (+) Chipped, Poor Dentition, Missing   Pulmonary neg shortness of breath, former smoker          Cardiovascular Exercise Tolerance: Good hypertension, (-) angina + Peripheral Vascular Disease and +CHF  (-) Past MI and (-) DOE      Neuro/Psych CVA  negative psych ROS   GI/Hepatic Neg liver ROS,GERD  Medicated and Controlled,,  Endo/Other  negative endocrine ROS    Renal/GU Renal disease  negative genitourinary   Musculoskeletal  (+) Arthritis ,    Abdominal   Peds  Hematology negative hematology ROS (+)   Anesthesia Other Findings Past Medical History: No date: Arthritis No date: Bladder cancer (HCC) No date: Bladder cancer (HCC) No date: Cancer (HCC)     Comment:  multiple myeloma No date: Chronic kidney disease No date: Chronic kidney disease No date: Dizziness No date: GERD (gastroesophageal reflux disease) No date: Hypertension No date: Multiple myeloma (HCC) No date: Stroke [facility])     Comment:  tia x 2 No date: Tubular adenoma of colon  Past Surgical History: No date: bladder tumor removed 1970: BREAST EXCISIONAL BIOPSY; Bilateral     Comment:  Benign No date: BREAST SURGERY     Comment:  bx 12/16/2016: CATARACT EXTRACTION W/PHACO; Left     Comment:  Procedure: CATARACT EXTRACTION PHACO AND INTRAOCULAR               LENS PLACEMENT (IOC);  Surgeon: Nevada Crane, MD;                Location: [facility] ORS;  Service: Ophthalmology;  Laterality:              Left;  [place] 00:38.0 AP% 14.6 CDE 5.54 Fluid pack lot #               [id number] 03/03/2017:  CATARACT EXTRACTION W/PHACO; Right     Comment:  Procedure: CATARACT EXTRACTION PHACO AND INTRAOCULAR               LENS PLACEMENT (IOC);  Surgeon: Nevada Crane, MD;                Location: [facility] ORS;  Service: Ophthalmology;  Laterality:              Right;  Lot # [id number] [place]: 00:29.8 AP%: 10.1 CDE: 3.01 07/25/2015: COLONOSCOPY WITH PROPOFOL; N/A     Comment:  Procedure: COLONOSCOPY WITH PROPOFOL;  Surgeon: Elnita Maxwell, MD;  Location: [facility] ENDOSCOPY;  Service:               Endoscopy;  Laterality: N/A; 07/25/2015: ESOPHAGOGASTRODUODENOSCOPY (EGD) WITH PROPOFOL; N/A     Comment:  Procedure: ESOPHAGOGASTRODUODENOSCOPY (EGD) WITH               PROPOFOL;  Surgeon: Elnita Maxwell, MD;  Location:               [facility] ENDOSCOPY;  Service: Endoscopy;  Laterality: N/A; No date: HERNIA REPAIR  BMI    Body Mass Index:  28.32 kg/m  Reproductive/Obstetrics negative OB ROS                             Anesthesia Physical Anesthesia Plan  ASA: III  Anesthesia Plan: General   Post-op Pain Management:    Induction: Intravenous  PONV Risk Score and Plan: Propofol infusion and TIVA  Airway Management Planned: Natural Airway and Nasal Cannula  Additional Equipment:   Intra-op Plan:   Post-operative Plan:   Informed Consent: I have reviewed the patients History and Physical, chart, labs and discussed the procedure including the risks, benefits and alternatives for the proposed anesthesia with the patient or authorized representative who has indicated his/her understanding and acceptance.     Dental Advisory Given  Plan Discussed with: Anesthesiologist, CRNA and Surgeon  Anesthesia Plan Comments: (Patient consented for risks of anesthesia including but not limited to:  - adverse reactions to medications - risk of intubation if required - damage to teeth, lips or other oral mucosa - sore throat or hoarseness - Damage to  heart, brain, lungs or loss of life  Patient voiced understanding.)        Anesthesia Quick Evaluation

## 2022-07-14 NOTE — Anesthesia Postprocedure Evaluation (Signed)
Anesthesia Post Note  Patient: Julie Khan  Procedure(s) Performed: COLONOSCOPY WITH PROPOFOL  Patient location during evaluation: Endoscopy Anesthesia Type: General Level of consciousness: awake and alert Pain management: pain level controlled Vital Signs Assessment: post-procedure vital signs reviewed and stable Respiratory status: spontaneous breathing, nonlabored ventilation, respiratory function stable and patient connected to nasal cannula oxygen Cardiovascular status: blood pressure returned to baseline and stable Postop Assessment: no apparent nausea or vomiting Anesthetic complications: no  No notable events documented.   Last Vitals:  Vitals:   07/14/22 1219 07/14/22 1220  BP:  (!) 160/90  Pulse: 81   Resp: 12 20  Temp:  (!) 35.6 C  SpO2: 93%     Last Pain:  Vitals:   07/14/22 1220  TempSrc: Temporal  PainSc:                  Stephanie Coup

## 2022-07-15 ENCOUNTER — Encounter: Payer: Self-pay | Admitting: Internal Medicine

## 2022-07-15 DIAGNOSIS — I2510 Atherosclerotic heart disease of native coronary artery without angina pectoris: Secondary | ICD-10-CM | POA: Diagnosis not present

## 2022-07-15 DIAGNOSIS — I2089 Other forms of angina pectoris: Secondary | ICD-10-CM | POA: Diagnosis not present

## 2022-07-19 DIAGNOSIS — E785 Hyperlipidemia, unspecified: Secondary | ICD-10-CM | POA: Diagnosis not present

## 2022-07-19 DIAGNOSIS — N184 Chronic kidney disease, stage 4 (severe): Secondary | ICD-10-CM | POA: Diagnosis not present

## 2022-07-19 DIAGNOSIS — I10 Essential (primary) hypertension: Secondary | ICD-10-CM | POA: Diagnosis not present

## 2022-07-19 DIAGNOSIS — K219 Gastro-esophageal reflux disease without esophagitis: Secondary | ICD-10-CM | POA: Diagnosis not present

## 2022-07-19 DIAGNOSIS — I2510 Atherosclerotic heart disease of native coronary artery without angina pectoris: Secondary | ICD-10-CM | POA: Diagnosis not present

## 2022-07-22 DIAGNOSIS — N184 Chronic kidney disease, stage 4 (severe): Secondary | ICD-10-CM | POA: Diagnosis not present

## 2022-07-22 DIAGNOSIS — E875 Hyperkalemia: Secondary | ICD-10-CM | POA: Diagnosis not present

## 2022-07-22 DIAGNOSIS — I129 Hypertensive chronic kidney disease with stage 1 through stage 4 chronic kidney disease, or unspecified chronic kidney disease: Secondary | ICD-10-CM | POA: Diagnosis not present

## 2022-07-22 DIAGNOSIS — N2581 Secondary hyperparathyroidism of renal origin: Secondary | ICD-10-CM | POA: Diagnosis not present

## 2022-07-22 DIAGNOSIS — I10 Essential (primary) hypertension: Secondary | ICD-10-CM | POA: Diagnosis not present

## 2022-07-29 DIAGNOSIS — I129 Hypertensive chronic kidney disease with stage 1 through stage 4 chronic kidney disease, or unspecified chronic kidney disease: Secondary | ICD-10-CM | POA: Diagnosis not present

## 2022-07-29 DIAGNOSIS — N1832 Chronic kidney disease, stage 3b: Secondary | ICD-10-CM | POA: Diagnosis not present

## 2022-08-09 ENCOUNTER — Inpatient Hospital Stay: Payer: 59 | Attending: Oncology

## 2022-08-09 DIAGNOSIS — Z8673 Personal history of transient ischemic attack (TIA), and cerebral infarction without residual deficits: Secondary | ICD-10-CM | POA: Insufficient documentation

## 2022-08-09 DIAGNOSIS — Z800 Family history of malignant neoplasm of digestive organs: Secondary | ICD-10-CM | POA: Insufficient documentation

## 2022-08-09 DIAGNOSIS — M129 Arthropathy, unspecified: Secondary | ICD-10-CM | POA: Diagnosis not present

## 2022-08-09 DIAGNOSIS — N2889 Other specified disorders of kidney and ureter: Secondary | ICD-10-CM | POA: Insufficient documentation

## 2022-08-09 DIAGNOSIS — Z86010 Personal history of colonic polyps: Secondary | ICD-10-CM | POA: Diagnosis not present

## 2022-08-09 DIAGNOSIS — K219 Gastro-esophageal reflux disease without esophagitis: Secondary | ICD-10-CM | POA: Insufficient documentation

## 2022-08-09 DIAGNOSIS — Z8052 Family history of malignant neoplasm of bladder: Secondary | ICD-10-CM | POA: Diagnosis not present

## 2022-08-09 DIAGNOSIS — I129 Hypertensive chronic kidney disease with stage 1 through stage 4 chronic kidney disease, or unspecified chronic kidney disease: Secondary | ICD-10-CM | POA: Diagnosis not present

## 2022-08-09 DIAGNOSIS — Z803 Family history of malignant neoplasm of breast: Secondary | ICD-10-CM | POA: Diagnosis not present

## 2022-08-09 DIAGNOSIS — D472 Monoclonal gammopathy: Secondary | ICD-10-CM | POA: Insufficient documentation

## 2022-08-09 DIAGNOSIS — Z79899 Other long term (current) drug therapy: Secondary | ICD-10-CM | POA: Insufficient documentation

## 2022-08-09 DIAGNOSIS — N189 Chronic kidney disease, unspecified: Secondary | ICD-10-CM | POA: Diagnosis not present

## 2022-08-09 LAB — CBC WITH DIFFERENTIAL/PLATELET
Abs Immature Granulocytes: 0 10*3/uL (ref 0.00–0.07)
Basophils Absolute: 0 10*3/uL (ref 0.0–0.1)
Basophils Relative: 1 %
Eosinophils Absolute: 0.1 10*3/uL (ref 0.0–0.5)
Eosinophils Relative: 1 %
HCT: 37 % (ref 36.0–46.0)
Hemoglobin: 12 g/dL (ref 12.0–15.0)
Immature Granulocytes: 0 %
Lymphocytes Relative: 44 %
Lymphs Abs: 1.6 10*3/uL (ref 0.7–4.0)
MCH: 31.3 pg (ref 26.0–34.0)
MCHC: 32.4 g/dL (ref 30.0–36.0)
MCV: 96.4 fL (ref 80.0–100.0)
Monocytes Absolute: 0.3 10*3/uL (ref 0.1–1.0)
Monocytes Relative: 8 %
Neutro Abs: 1.7 10*3/uL (ref 1.7–7.7)
Neutrophils Relative %: 46 %
Platelets: 164 10*3/uL (ref 150–400)
RBC: 3.84 MIL/uL — ABNORMAL LOW (ref 3.87–5.11)
RDW: 13.1 % (ref 11.5–15.5)
WBC: 3.7 10*3/uL — ABNORMAL LOW (ref 4.0–10.5)
nRBC: 0 % (ref 0.0–0.2)

## 2022-08-09 LAB — BASIC METABOLIC PANEL
Anion gap: 8 (ref 5–15)
BUN: 39 mg/dL — ABNORMAL HIGH (ref 8–23)
CO2: 21 mmol/L — ABNORMAL LOW (ref 22–32)
Calcium: 8.9 mg/dL (ref 8.9–10.3)
Chloride: 106 mmol/L (ref 98–111)
Creatinine, Ser: 1.56 mg/dL — ABNORMAL HIGH (ref 0.44–1.00)
GFR, Estimated: 32 mL/min — ABNORMAL LOW (ref >60)
Glucose, Bld: 92 mg/dL (ref 70–99)
Potassium: 4 mmol/L (ref 3.5–5.1)
Sodium: 135 mmol/L (ref 135–145)

## 2022-08-10 LAB — KAPPA/LAMBDA LIGHT CHAINS
Kappa free light chain: 307.2 mg/L — ABNORMAL HIGH (ref 3.3–19.4)
Kappa, lambda light chain ratio: 23.81 — ABNORMAL HIGH (ref 0.26–1.65)
Lambda free light chains: 12.9 mg/L (ref 5.7–26.3)

## 2022-08-10 LAB — IGG, IGA, IGM
IgA: 54 mg/dL — ABNORMAL LOW (ref 64–422)
IgG (Immunoglobin G), Serum: 4384 mg/dL — ABNORMAL HIGH (ref 586–1602)
IgM (Immunoglobulin M), Srm: 101 mg/dL (ref 26–217)

## 2022-08-12 LAB — PROTEIN ELECTROPHORESIS, SERUM
A/G Ratio: 0.6 — ABNORMAL LOW (ref 0.7–1.7)
Albumin ELP: 3.3 g/dL (ref 2.9–4.4)
Alpha-1-Globulin: 0.2 g/dL (ref 0.0–0.4)
Alpha-2-Globulin: 0.7 g/dL (ref 0.4–1.0)
Beta Globulin: 1 g/dL (ref 0.7–1.3)
Gamma Globulin: 3.7 g/dL — ABNORMAL HIGH (ref 0.4–1.8)
Globulin, Total: 5.6 g/dL — ABNORMAL HIGH (ref 2.2–3.9)
M-Spike, %: 3.1 g/dL — ABNORMAL HIGH
Total Protein ELP: 8.9 g/dL — ABNORMAL HIGH (ref 6.0–8.5)

## 2022-08-16 ENCOUNTER — Inpatient Hospital Stay (HOSPITAL_BASED_OUTPATIENT_CLINIC_OR_DEPARTMENT_OTHER): Payer: 59 | Admitting: Oncology

## 2022-08-16 ENCOUNTER — Encounter: Payer: Self-pay | Admitting: Oncology

## 2022-08-16 VITALS — BP 135/77 | HR 79 | Temp 96.9°F | Resp 16 | Ht 60.0 in | Wt 158.3 lb

## 2022-08-16 DIAGNOSIS — N189 Chronic kidney disease, unspecified: Secondary | ICD-10-CM | POA: Diagnosis not present

## 2022-08-16 DIAGNOSIS — K219 Gastro-esophageal reflux disease without esophagitis: Secondary | ICD-10-CM | POA: Diagnosis not present

## 2022-08-16 DIAGNOSIS — Z79899 Other long term (current) drug therapy: Secondary | ICD-10-CM | POA: Diagnosis not present

## 2022-08-16 DIAGNOSIS — M129 Arthropathy, unspecified: Secondary | ICD-10-CM | POA: Diagnosis not present

## 2022-08-16 DIAGNOSIS — D472 Monoclonal gammopathy: Secondary | ICD-10-CM | POA: Diagnosis not present

## 2022-08-16 DIAGNOSIS — N2889 Other specified disorders of kidney and ureter: Secondary | ICD-10-CM | POA: Diagnosis not present

## 2022-08-16 DIAGNOSIS — Z86010 Personal history of colonic polyps: Secondary | ICD-10-CM | POA: Diagnosis not present

## 2022-08-16 DIAGNOSIS — Z8673 Personal history of transient ischemic attack (TIA), and cerebral infarction without residual deficits: Secondary | ICD-10-CM | POA: Diagnosis not present

## 2022-08-16 DIAGNOSIS — Z8052 Family history of malignant neoplasm of bladder: Secondary | ICD-10-CM | POA: Diagnosis not present

## 2022-08-16 DIAGNOSIS — Z800 Family history of malignant neoplasm of digestive organs: Secondary | ICD-10-CM | POA: Diagnosis not present

## 2022-08-16 DIAGNOSIS — I129 Hypertensive chronic kidney disease with stage 1 through stage 4 chronic kidney disease, or unspecified chronic kidney disease: Secondary | ICD-10-CM | POA: Diagnosis not present

## 2022-08-16 DIAGNOSIS — Z803 Family history of malignant neoplasm of breast: Secondary | ICD-10-CM | POA: Diagnosis not present

## 2022-08-16 NOTE — Progress Notes (Signed)
[facility]  Telephone:(336) [phone] Fax:(336) [phone]  ID: Julie Khan OB: Aug 12, 1934  MR#: [id number]  DGU#:[id number]  Patient Care Team: Marisue Ivan, MD as PCP - General (Family Medicine) Jeralyn Ruths, MD as Consulting Physician (Oncology)   CHIEF COMPLAINT: Smoldering myeloma  INTERVAL HISTORY: Patient returns to clinic today for repeat laboratory work and routine 69-month evaluation.  She continues to feel well and remains asymptomatic.  She remains active and is leaving for a month-long trip to [place] and [place].  She denies any weakness or fatigue.  She has no neurologic complaints.  She denies any bony pain.  She denies any recent fevers or illnesses.  She has a good appetite and denies weight loss.  She denies any chest pain, shortness of breath, cough, or hemoptysis.  She denies any nausea, vomiting, constipation, or diarrhea.  Patient offers no specific complaints today.  REVIEW OF SYSTEMS:   Review of Systems  Constitutional: Negative.  Negative for fever, malaise/fatigue and weight loss.  Respiratory: Negative.  Negative for cough and shortness of breath.   Cardiovascular: Negative.  Negative for chest pain and leg swelling.  Gastrointestinal: Negative.  Negative for abdominal pain, blood in stool and melena.  Genitourinary: Negative.  Negative for dysuria.  Musculoskeletal: Negative.  Negative for back pain.  Skin: Negative.  Negative for rash.  Neurological: Negative.  Negative for sensory change, focal weakness and weakness.  Psychiatric/Behavioral: Negative.  The patient is not nervous/anxious.     As per HPI. Otherwise, a complete review of systems is negative.  PAST MEDICAL HISTORY: Past Medical History:  Diagnosis Date   Arthritis    Bladder cancer (HCC)    Bladder cancer (HCC)    Cancer (HCC)    multiple myeloma   Chronic kidney disease    Chronic kidney disease    Dizziness    GERD (gastroesophageal reflux disease)     Hypertension    Multiple myeloma (HCC)    Stroke (HCC)    tia x 2   Tubular adenoma of colon     PAST SURGICAL HISTORY: Past Surgical History:  Procedure Laterality Date   bladder tumor removed     BREAST EXCISIONAL BIOPSY Bilateral 1970   Benign   BREAST SURGERY     bx   CATARACT EXTRACTION W/PHACO Left 12/16/2016   Procedure: CATARACT EXTRACTION PHACO AND INTRAOCULAR LENS PLACEMENT (IOC);  Surgeon: Nevada Crane, MD;  Location: [facility] ORS;  Service: Ophthalmology;  Laterality: Left;  [place] 00:38.0 AP% 14.6 CDE 5.54 Fluid pack lot # [id number]   CATARACT EXTRACTION W/PHACO Right 03/03/2017   Procedure: CATARACT EXTRACTION PHACO AND INTRAOCULAR LENS PLACEMENT (IOC);  Surgeon: Nevada Crane, MD;  Location: [facility] ORS;  Service: Ophthalmology;  Laterality: Right;  Lot # [id number] [place]: 00:29.8 AP%: 10.1 CDE: 3.01   COLONOSCOPY WITH PROPOFOL N/A 07/25/2015   Procedure: COLONOSCOPY WITH PROPOFOL;  Surgeon: Elnita Maxwell, MD;  Location: [facility] ENDOSCOPY;  Service: Endoscopy;  Laterality: N/A;   COLONOSCOPY WITH PROPOFOL N/A 06/29/2017   Procedure: COLONOSCOPY WITH PROPOFOL;  Surgeon: Toledo, Boykin Nearing, MD;  Location: [facility] ENDOSCOPY;  Service: Gastroenterology;  Laterality: N/A;   COLONOSCOPY WITH PROPOFOL N/A 07/14/2022   Procedure: COLONOSCOPY WITH PROPOFOL;  Surgeon: Toledo, Boykin Nearing, MD;  Location: [facility] ENDOSCOPY;  Service: Gastroenterology;  Laterality: N/A;   CORONARY ANGIOGRAPHY N/A 05/24/2019   Procedure: CORONARY ANGIOGRAPHY;  Surgeon: Laurier Nancy, MD;  Location: [facility] INVASIVE CV LAB;  Service: Cardiovascular;  Laterality: N/A;   ESOPHAGOGASTRODUODENOSCOPY (  EGD) WITH PROPOFOL N/A 07/25/2015   Procedure: ESOPHAGOGASTRODUODENOSCOPY (EGD) WITH PROPOFOL;  Surgeon: Elnita Maxwell, MD;  Location: [facility] ENDOSCOPY;  Service: Endoscopy;  Laterality: N/A;   ESOPHAGOGASTRODUODENOSCOPY (EGD) WITH PROPOFOL N/A 06/29/2017   Procedure: ESOPHAGOGASTRODUODENOSCOPY (EGD) WITH PROPOFOL;   Surgeon: Toledo, Boykin Nearing, MD;  Location: [facility] ENDOSCOPY;  Service: Gastroenterology;  Laterality: N/A;   HERNIA REPAIR     LEFT HEART CATH N/A 05/24/2019   Procedure: Left Heart Cath;  Surgeon: Laurier Nancy, MD;  Location: [facility] INVASIVE CV LAB;  Service: Cardiovascular;  Laterality: N/A;    FAMILY HISTORY: Family History  Problem Relation Age of Onset   Cancer Mother    Colon cancer Mother    Colon polyps Mother    Cancer Sister    Breast cancer Sister 93   Cancer Brother    Bladder Cancer Neg Hx    Kidney cancer Neg Hx     ADVANCED DIRECTIVES (Y/N):  N  HEALTH MAINTENANCE: Social History   Tobacco Use   Smoking status: Former   Smokeless tobacco: Never  Building services engineer Use: Never used  Substance Use Topics   Alcohol use: No   Drug use: No     Colonoscopy:  PAP:  Bone density:  Lipid panel:  Allergies  Allergen Reactions   Iodine     Seizure  Betadine ok Other reaction(s): Other (See Comments) Sneezing Seizure  Betadine ok   Sucralfate Rash   Ace Inhibitors Cough   Penicillins Other (See Comments) and Rash    Has patient had a PCN reaction causing immediate rash, facial/tongue/throat swelling, SOB or lightheadedness with hypotension: Unknown Has patient had a PCN reaction causing severe rash involving mucus membranes or skin necrosis: Unknown Has patient had a PCN reaction that required hospitalization: Unknown Has patient had a PCN reaction occurring within the last 10 years: Unknown If all of the above answers are "NO", then may proceed with Cephalosporin use.  Other reaction(s): Other (See Comments) Has patient had a PCN reaction causing immediate rash, facial/tongue/throat swelling, SOB or lightheadedness with hypotension: Unknown Has patient had a PCN reaction causing severe rash involving mucus membranes or skin necrosis: Unknown Has patient had a PCN reaction that required hospitalization: Unknown Has patient had a PCN reaction occurring  within the last 10 years: Unknown If all of the above answers are "NO", then may proceed with Cephalosporin use. Has patient had a PCN reaction causing immediate rash, facial/tongue/throat swelling, SOB or lightheadedness with hypotension: Unknown Has patient had a PCN reaction causing severe rash involving mucus membranes or skin necrosis: Unknown Has patient had a PCN reaction that required hospitalization: Unknown Has patient had a PCN reaction occurring within the last 10 years: Unknown If all of the above answers are "NO", then may proceed with Cephalosporin use.    Current Outpatient Medications  Medication Sig Dispense Refill   Cholecalciferol (VITAMIN D3) 10 MCG (400 UNIT) CAPS      meclizine (ANTIVERT) 25 MG tablet Take 25 mg by mouth 3 (three) times daily as needed.  1   metoprolol succinate (TOPROL-XL) 25 MG 24 hr tablet Take 50 mg by mouth daily.      nitroGLYCERIN (NITROSTAT) 0.3 MG SL tablet Place 1 tablet under the tongue as needed.     pantoprazole (PROTONIX) 40 MG tablet Take by mouth.     rOPINIRole (REQUIP) 0.25 MG tablet Take 1 tablet by mouth as needed.     rosuvastatin (CRESTOR) 10 MG tablet Take  10 mg by mouth daily.     ranolazine (RANEXA) 500 MG 12 hr tablet Take 500 mg by mouth 2 (two) times daily. (Patient not taking: Reported on 07/14/2022)     No current facility-administered medications for this visit.   Facility-Administered Medications Ordered in Other Visits  Medication Dose Route Frequency Provider Last Rate Last Admin   sodium chloride flush (NS) 0.9 % injection 3 mL  3 mL Intravenous Q12H Adrian Blackwater A, MD        OBJECTIVE: Vitals:   08/16/22 0950  BP: 135/77  Pulse: 79  Resp: 16  Temp: (!) 96.9 F (36.1 C)  SpO2: 99%     Body mass index is 30.92 kg/m.    ECOG FS:0 - Asymptomatic  General: Well-developed, well-nourished, no acute distress. Eyes: Pink conjunctiva, anicteric sclera. HEENT: Normocephalic, moist mucous membranes. Lungs: No  audible wheezing or coughing. Heart: Regular rate and rhythm. Abdomen: Soft, nontender, no obvious distention. Musculoskeletal: No edema, cyanosis, or clubbing. Neuro: Alert, answering all questions appropriately. Cranial nerves grossly intact. Skin: No rashes or petechiae noted. Psych: Normal affect.  LAB RESULTS:  Lab Results  Component Value Date   NA 135 08/09/2022   K 4.0 08/09/2022   CL 106 08/09/2022   CO2 21 (L) 08/09/2022   GLUCOSE 92 08/09/2022   BUN 39 (H) 08/09/2022   CREATININE 1.56 (H) 08/09/2022   CALCIUM 8.9 08/09/2022   PROT 9.8 (H) 04/02/2022   ALBUMIN 3.4 (L) 04/02/2022   AST 34 04/02/2022   ALT 17 04/02/2022   ALKPHOS 113 04/02/2022   BILITOT 0.5 04/02/2022   GFRNONAA 32 (L) 08/09/2022   GFRAA 33 (L) 11/19/2019    Lab Results  Component Value Date   WBC 3.7 (L) 08/09/2022   NEUTROABS 1.7 08/09/2022   HGB 12.0 08/09/2022   HCT 37.0 08/09/2022   MCV 96.4 08/09/2022   PLT 164 08/09/2022      STUDIES: No results found.  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  Repeat bone marrow biopsy on May 19, 2020 was essentially unchanged with approximately 15% plasma cells with monosomy 13 cytogenetics.  Her most recent skeletal survey on April 10, 2020 reviewed independently with no radiographic sign of multiple myeloma or progressive disease.  Previously, patient's M spike was relatively stable between 2.0 and 2.5.  Since December 2021 her M spike has ranged between 3.1 and 3.5.  Her most recent result was 3.1.  IgG levels have ranged between 3954 and 4640 over the same timeframe, her most recent result is 4384.  Kappa free light chains also remain elevated and have slowly trended up from 156.2 in May 2019 to her current level of 307.2.  M her renal insufficiency remains unchanged.  No intervention is needed.   Patient does not require treatment.  Return to clinic in 6 months for routine evaluation.   Renal insufficiency: Chronic and unchanged.  Patient's most recent creatinine is 1.56.  Unrelated to underlying smoldering myeloma.  Continue follow-up with nephrology as scheduled. Anemia: Resolved.  Patient's hemoglobin is 12.0 today   Jeralyn Ruths, MD   08/16/2022 10:11 AM

## 2022-09-17 ENCOUNTER — Ambulatory Visit: Admit: 2022-09-17 | Payer: 59 | Admitting: Gastroenterology

## 2022-09-17 SURGERY — COLONOSCOPY
Anesthesia: General

## 2022-09-29 DIAGNOSIS — K625 Hemorrhage of anus and rectum: Secondary | ICD-10-CM | POA: Diagnosis not present

## 2022-10-14 ENCOUNTER — Ambulatory Visit: Payer: Medicare Other | Admitting: Cardiovascular Disease

## 2022-11-17 DIAGNOSIS — I2510 Atherosclerotic heart disease of native coronary artery without angina pectoris: Secondary | ICD-10-CM | POA: Diagnosis not present

## 2022-11-17 DIAGNOSIS — D509 Iron deficiency anemia, unspecified: Secondary | ICD-10-CM | POA: Diagnosis not present

## 2022-11-17 DIAGNOSIS — I10 Essential (primary) hypertension: Secondary | ICD-10-CM | POA: Diagnosis not present

## 2022-11-17 DIAGNOSIS — N184 Chronic kidney disease, stage 4 (severe): Secondary | ICD-10-CM | POA: Diagnosis not present

## 2022-11-24 DIAGNOSIS — B354 Tinea corporis: Secondary | ICD-10-CM | POA: Diagnosis not present

## 2022-11-24 DIAGNOSIS — R1031 Right lower quadrant pain: Secondary | ICD-10-CM | POA: Diagnosis not present

## 2022-11-24 DIAGNOSIS — G2581 Restless legs syndrome: Secondary | ICD-10-CM | POA: Diagnosis not present

## 2022-11-24 DIAGNOSIS — Z0000 Encounter for general adult medical examination without abnormal findings: Secondary | ICD-10-CM | POA: Diagnosis not present

## 2022-11-24 DIAGNOSIS — I10 Essential (primary) hypertension: Secondary | ICD-10-CM | POA: Diagnosis not present

## 2022-11-24 DIAGNOSIS — Z1389 Encounter for screening for other disorder: Secondary | ICD-10-CM | POA: Diagnosis not present

## 2022-11-26 ENCOUNTER — Other Ambulatory Visit: Payer: Self-pay | Admitting: Family Medicine

## 2022-11-26 DIAGNOSIS — Z0000 Encounter for general adult medical examination without abnormal findings: Secondary | ICD-10-CM

## 2022-11-26 DIAGNOSIS — R1031 Right lower quadrant pain: Secondary | ICD-10-CM

## 2022-12-03 ENCOUNTER — Ambulatory Visit
Admission: RE | Admit: 2022-12-03 | Discharge: 2022-12-03 | Disposition: A | Discharge status: Home / Self Care | Payer: 59 | Source: Ambulatory Visit | Attending: Family Medicine | Admitting: Family Medicine

## 2022-12-03 DIAGNOSIS — Z0000 Encounter for general adult medical examination without abnormal findings: Secondary | ICD-10-CM | POA: Diagnosis not present

## 2022-12-03 DIAGNOSIS — K5730 Diverticulosis of large intestine without perforation or abscess without bleeding: Secondary | ICD-10-CM | POA: Diagnosis not present

## 2022-12-03 DIAGNOSIS — R1031 Right lower quadrant pain: Secondary | ICD-10-CM | POA: Diagnosis not present

## 2022-12-03 DIAGNOSIS — K449 Diaphragmatic hernia without obstruction or gangrene: Secondary | ICD-10-CM | POA: Diagnosis not present

## 2022-12-20 ENCOUNTER — Other Ambulatory Visit: Payer: Self-pay | Admitting: Family Medicine

## 2022-12-20 DIAGNOSIS — Z1231 Encounter for screening mammogram for malignant neoplasm of breast: Secondary | ICD-10-CM

## 2022-12-21 ENCOUNTER — Ambulatory Visit (INDEPENDENT_AMBULATORY_CARE_PROVIDER_SITE_OTHER): Payer: 59 | Admitting: Urology

## 2022-12-21 ENCOUNTER — Encounter: Payer: Self-pay | Admitting: Urology

## 2022-12-21 VITALS — BP 114/71 | HR 86 | Ht 60.0 in | Wt 158.0 lb

## 2022-12-21 DIAGNOSIS — R8281 Pyuria: Secondary | ICD-10-CM | POA: Diagnosis not present

## 2022-12-21 DIAGNOSIS — R8271 Bacteriuria: Secondary | ICD-10-CM | POA: Diagnosis not present

## 2022-12-21 DIAGNOSIS — R319 Hematuria, unspecified: Secondary | ICD-10-CM | POA: Diagnosis not present

## 2022-12-21 DIAGNOSIS — R3989 Other symptoms and signs involving the genitourinary system: Secondary | ICD-10-CM | POA: Diagnosis not present

## 2022-12-21 DIAGNOSIS — Z8551 Personal history of malignant neoplasm of bladder: Secondary | ICD-10-CM | POA: Diagnosis not present

## 2022-12-21 DIAGNOSIS — N390 Urinary tract infection, site not specified: Secondary | ICD-10-CM | POA: Diagnosis not present

## 2022-12-21 LAB — URINALYSIS, COMPLETE
Bilirubin, UA: NEGATIVE
Glucose, UA: NEGATIVE
Ketones, UA: NEGATIVE
Nitrite, UA: POSITIVE — AB
Specific Gravity, UA: 1.015 (ref 1.005–1.030)
Urobilinogen, Ur: 0.2 mg/dL (ref 0.2–1.0)
pH, UA: 5.5 (ref 5.0–7.5)

## 2022-12-21 LAB — MICROSCOPIC EXAMINATION: WBC, UA: >30 /HPF — AB (ref 0–5)

## 2022-12-21 MED ORDER — SULFAMETHOXAZOLE-TRIMETHOPRIM 800-160 MG PO TABS
1.0000 | ORAL_TABLET | Freq: Two times a day (BID) | ORAL | 0 refills | Status: DC
Start: 2022-12-21 — End: 2023-01-26

## 2022-12-21 NOTE — Progress Notes (Signed)
12/21/2022 6:40 PM   Julie Khan [birth year] [id number]  Referring provider: Marisue Ivan, MD [phone] [facility] [address] [facility] [place],  [place] [address]  Urological history: 1. Bladder cancer -was told her cancer was stage 2 bladder cancer, dx and treated in [place].  It is unclear whether this was squamous or TCC -dx 19 years ago followed by 6 weeks BCG -last surveillance cystoscopy 11/26/2020-NED -RUS (12/2020) - WNL -cysto (01/2022) - NED   2. rUTI's -contributing factors of age and vaginal atrophy -documented positive urine cultures over the last year  None  3. Cystocele -bladder lift ~ 30 years ago   4. High risk hematuria -Former smoker -CTU (01/2022) - no worrisome GU findings -cysto (01/2022) - NED -non contrast CT (11/2022) - no worrisome GU findings    Chief Complaint  Patient presents with   Follow-up     HPI: Julie Khan is a 88 y.o. female who presents today for Possible UTI.    Previous records reviewed.   Non contrast CT in August for RLQ pain by PCP, no hydro, no stones and no renal or bladder masses.   She has been having 3 to 4 days of suprapubic pressure, dysuria, frequency and voiding small amounts.  She has been taking AZO to control her symptoms.  Patient denies any modifying or aggravating factors.  Patient denies any recent UTI's, gross hematuria, dysuria or suprapubic/flank pain.  Patient denies any fevers, chills, nausea or vomiting.    UA yellow cloudy, specific gravity 1.015, 2+ heme, pH 5.5, 1+ protein, nitrate positive, 2+ leukocytes, greater than 30 WBCs, 3-10 RBCs, 0-10 epithelial cells and many bacteria.  PMH: Past Medical History:  Diagnosis Date   Arthritis    Bladder cancer (HCC)    Bladder cancer (HCC)    Cancer (HCC)    multiple myeloma   Chronic kidney disease    Chronic kidney disease    Dizziness    GERD (gastroesophageal reflux disease)    Hypertension    Multiple myeloma (HCC)    Stroke  (HCC)    tia x 2   Tubular adenoma of colon     Surgical History: Past Surgical History:  Procedure Laterality Date   bladder tumor removed     BREAST EXCISIONAL BIOPSY Bilateral 1970   Benign   BREAST SURGERY     bx   CATARACT EXTRACTION W/PHACO Left 12/16/2016   Procedure: CATARACT EXTRACTION PHACO AND INTRAOCULAR LENS PLACEMENT (IOC);  Surgeon: Nevada Crane, MD;  Location: [facility] ORS;  Service: Ophthalmology;  Laterality: Left;  [place] 00:38.0 AP% 14.6 CDE 5.54 Fluid pack lot # [id number]   CATARACT EXTRACTION W/PHACO Right 03/03/2017   Procedure: CATARACT EXTRACTION PHACO AND INTRAOCULAR LENS PLACEMENT (IOC);  Surgeon: Nevada Crane, MD;  Location: [facility] ORS;  Service: Ophthalmology;  Laterality: Right;  Lot # [id number] [place]: 00:29.8 AP%: 10.1 CDE: 3.01   COLONOSCOPY WITH PROPOFOL N/A 07/25/2015   Procedure: COLONOSCOPY WITH PROPOFOL;  Surgeon: Elnita Maxwell, MD;  Location: [facility] ENDOSCOPY;  Service: Endoscopy;  Laterality: N/A;   COLONOSCOPY WITH PROPOFOL N/A 06/29/2017   Procedure: COLONOSCOPY WITH PROPOFOL;  Surgeon: Toledo, Boykin Nearing, MD;  Location: [facility] ENDOSCOPY;  Service: Gastroenterology;  Laterality: N/A;   COLONOSCOPY WITH PROPOFOL N/A 07/14/2022   Procedure: COLONOSCOPY WITH PROPOFOL;  Surgeon: Toledo, Boykin Nearing, MD;  Location: [facility] ENDOSCOPY;  Service: Gastroenterology;  Laterality: N/A;   CORONARY ANGIOGRAPHY N/A 05/24/2019   Procedure: CORONARY ANGIOGRAPHY;  Surgeon: Laurier Nancy, MD;  Location: [facility] INVASIVE CV LAB;  Service: Cardiovascular;  Laterality: N/A;   ESOPHAGOGASTRODUODENOSCOPY (EGD) WITH PROPOFOL N/A 07/25/2015   Procedure: ESOPHAGOGASTRODUODENOSCOPY (EGD) WITH PROPOFOL;  Surgeon: Elnita Maxwell, MD;  Location[facility] ENDOSCOPY;  Service: Endoscopy;  Laterality: N/A;   ESOPHAGOGASTRODUODENOSCOPY (EGD) WITH PROPOFOL N/A 06/29/2017   Procedure: ESOPHAGOGASTRODUODENOSCOPY (EGD) WITH PROPOFOL;  Surgeon: Toledo, Boykin Nearing, MD;  Location: [facility] ENDOSCOPY;   Service: Gastroenterology;  Laterality: N/A;   HERNIA REPAIR     LEFT HEART CATH N/A 05/24/2019   Procedure: Left Heart Cath;  Surgeon: Laurier Nancy, MD;  Location: [facility] INVASIVE CV LAB;  Service: Cardiovascular;  Laterality: N/A;    Home Medications:  Allergies as of 12/21/2022       Reactions   Iodine    Seizure Betadine ok Other reaction(s): Other (See Comments) Sneezing Seizure Betadine ok   Sucralfate Rash   Ace Inhibitors Cough   Penicillins Other (See Comments), Rash   Has patient had a PCN reaction causing immediate rash, facial/tongue/throat swelling, SOB or lightheadedness with hypotension: Unknown Has patient had a PCN reaction causing severe rash involving mucus membranes or skin necrosis: Unknown Has patient had a PCN reaction that required hospitalization: Unknown Has patient had a PCN reaction occurring within the last 10 years: Unknown If all of the above answers are "NO", then may proceed with Cephalosporin use. Other reaction(s): Other (See Comments) Has patient had a PCN reaction causing immediate rash, facial/tongue/throat swelling, SOB or lightheadedness with hypotension: Unknown Has patient had a PCN reaction causing severe rash involving mucus membranes or skin necrosis: Unknown Has patient had a PCN reaction that required hospitalization: Unknown Has patient had a PCN reaction occurring within the last 10 years: Unknown If all of the above answers are "NO", then may proceed with Cephalosporin use. Has patient had a PCN reaction causing immediate rash, facial/tongue/throat swelling, SOB or lightheadedness with hypotension: Unknown Has patient had a PCN reaction causing severe rash involving mucus membranes or skin necrosis: Unknown Has patient had a PCN reaction that required hospitalization: Unknown Has patient had a PCN reaction occurring within the last 10 years: Unknown If all of the above answers are "NO", then may proceed with Cephalosporin use.         Medication List        Accurate as of December 21, 2022  6:40 PM. If you have any questions, ask your nurse or doctor.          meclizine 25 MG tablet Commonly known as: ANTIVERT Take 25 mg by mouth 3 (three) times daily as needed.   metoprolol succinate 25 MG 24 hr tablet Commonly known as: TOPROL-XL Take 50 mg by mouth daily.   nitroGLYCERIN 0.3 MG SL tablet Commonly known as: NITROSTAT Place 1 tablet under the tongue as needed.   pantoprazole 40 MG tablet Commonly known as: PROTONIX Take by mouth.   ranolazine 500 MG 12 hr tablet Commonly known as: RANEXA Take 500 mg by mouth 2 (two) times daily.   rOPINIRole 0.25 MG tablet Commonly known as: REQUIP Take 1 tablet by mouth as needed.   rosuvastatin 10 MG tablet Commonly known as: CRESTOR Take 10 mg by mouth daily.   sulfamethoxazole-trimethoprim 800-160 MG tablet Commonly known as: BACTRIM DS Take 1 tablet by mouth every 12 (twelve) hours. Started by: Michiel Cowboy   Vitamin D3 10 MCG (400 UNIT) Caps        Allergies:  Allergies  Allergen Reactions   Iodine  Seizure  Betadine ok Other reaction(s): Other (See Comments) Sneezing Seizure  Betadine ok   Sucralfate Rash   Ace Inhibitors Cough   Penicillins Other (See Comments) and Rash    Has patient had a PCN reaction causing immediate rash, facial/tongue/throat swelling, SOB or lightheadedness with hypotension: Unknown Has patient had a PCN reaction causing severe rash involving mucus membranes or skin necrosis: Unknown Has patient had a PCN reaction that required hospitalization: Unknown Has patient had a PCN reaction occurring within the last 10 years: Unknown If all of the above answers are "NO", then may proceed with Cephalosporin use.  Other reaction(s): Other (See Comments) Has patient had a PCN reaction causing immediate rash, facial/tongue/throat swelling, SOB or lightheadedness with hypotension: Unknown Has patient had a PCN  reaction causing severe rash involving mucus membranes or skin necrosis: Unknown Has patient had a PCN reaction that required hospitalization: Unknown Has patient had a PCN reaction occurring within the last 10 years: Unknown If all of the above answers are "NO", then may proceed with Cephalosporin use. Has patient had a PCN reaction causing immediate rash, facial/tongue/throat swelling, SOB or lightheadedness with hypotension: Unknown Has patient had a PCN reaction causing severe rash involving mucus membranes or skin necrosis: Unknown Has patient had a PCN reaction that required hospitalization: Unknown Has patient had a PCN reaction occurring within the last 10 years: Unknown If all of the above answers are "NO", then may proceed with Cephalosporin use.    Family History: Family History  Problem Relation Age of Onset   Cancer Mother    Colon cancer Mother    Colon polyps Mother    Cancer Sister    Breast cancer Sister 7   Cancer Brother    Bladder Cancer Neg Hx    Kidney cancer Neg Hx     Social History:  reports that she has quit smoking. She has never used smokeless tobacco. She reports that she does not drink alcohol and does not use drugs.  ROS: Pertinent ROS in HPI  Physical Exam: BP 114/71   Pulse 86   Ht 5' (1.524 m)   Wt 158 lb (71.7 kg)   BMI 30.86 kg/m   Constitutional:  Well nourished. Alert and oriented, No acute distress. HEENT: [place] AT, moist mucus membranes.  Trachea midline Cardiovascular: No clubbing, cyanosis, or edema. Respiratory: Normal respiratory effort, no increased work of breathing. Neurologic: Grossly intact, no focal deficits, moving all 4 extremities. Psychiatric: Normal mood and affect.   Laboratory Data:    Latest Ref Rng & Units 08/09/2022   11:11 AM 04/02/2022   10:20 AM 11/25/2021    8:59 AM  CBC  WBC 4.0 - 10.5 K/uL 3.7  4.0  3.6   Hemoglobin 12.0 - 15.0 g/dL 62.9  52.8  41.3   Hematocrit 36.0 - 46.0 % 37.0  35.5  34.1   Platelets  150 - 400 K/uL 164  180  155        Latest Ref Rng & Units 08/09/2022   11:11 AM 04/02/2022   10:19 AM 11/25/2021    8:59 AM  CMP  Glucose 70 - 99 mg/dL 92  80  244   BUN 8 - 23 mg/dL 39  30  38   Creatinine 0.44 - 1.00 mg/dL 0.10  2.72  5.36   Sodium 135 - 145 mmol/L 135  135  133   Potassium 3.5 - 5.1 mmol/L 4.0  4.6  3.8   Chloride 98 - 111 mmol/L  106  106  106   CO2 22 - 32 mmol/L 21  22  21    Calcium 8.9 - 10.3 mg/dL 8.9  8.7  8.6   Total Protein 6.5 - 8.1 g/dL  9.8  9.6   Total Bilirubin 0.3 - 1.2 mg/dL  0.5  0.8   Alkaline Phos 38 - 126 U/L  113  112   AST 15 - 41 U/L  34  34   ALT 0 - 44 U/L  17  18     Urinalysis See HPI and Epic I have reviewed the labs.    Pertinent Imaging: CLINICAL DATA:  RLQ pain for a month.   EXAM: CT ABDOMEN AND PELVIS WITHOUT CONTRAST   TECHNIQUE: Multidetector CT imaging of the abdomen and pelvis was performed following the standard protocol without IV contrast.   RADIATION DOSE REDUCTION: This exam was performed according to the departmental dose-optimization program which includes automated exposure control, adjustment of the mA and/or kV according to patient size and/or use of iterative reconstruction technique.   COMPARISON:  01/11/2022.   FINDINGS: Lower chest: Linear dependent subsegmental atelectasis right base. No pleural or pericardial effusion. Small hiatal hernia.   Hepatobiliary: No focal liver abnormality is seen. No gallstones, gallbladder wall thickening, or biliary dilatation.   Pancreas: Unremarkable. No pancreatic ductal dilatation or surrounding inflammatory changes.   Spleen: Normal in size without focal abnormality.   Adrenals/Urinary Tract: Adrenal glands are unremarkable. Kidneys are normal, without renal calculi, focal lesion, or hydronephrosis. Bladder is unremarkable.   Stomach/Bowel: Stomach is within normal limits. Appendix appears normal. No evidence of bowel wall thickening, distention,  or inflammatory changes. There is sigmoid diverticulosis.   Vascular/Lymphatic: Aortic atherosclerosis. No enlarged abdominal or pelvic lymph nodes.   Reproductive: Uterus and bilateral adnexa are unremarkable.   Other: No abdominal wall hernia or abnormality. No abdominopelvic ascites.   Musculoskeletal: No acute or significant osseous findings. Grade 1 L5 anterolisthesis with L5-S1 disc space narrowing.   IMPRESSION: 1. Small hiatal hernia. 2. Diverticulosis. 3. Lumbosacral degenerative changes. 4. No acute abdominal or pelvic pathology.     Electronically Signed   By: Layla Maw M.D.   On: 12/14/2022 08:04 I have independently reviewed the films.  See HPI.    Assessment & Plan:    1. Suspected UTI -UA w/ pyuria, hematuria and bacteriuria -urine sent for culture -Started empirically on septra DS, will adjust if necessary once urine culture and sensitivity results are available    2. High risk hematuria -former smoker -recent work up negative for worrisome GU findings -UA with micro heme, likely secondary to UTI -RTC in one month for repeat UA  3. Bladder cancer -cysto 11/2022 NED                Return in about 1 month (around 01/20/2023) for symptom recheck and repeat UA .  These notes generated with voice recognition software. I apologize for typographical errors.  Cloretta Ned  [facility] [address]  [address] [place], [place] [address] [phone]

## 2022-12-27 LAB — CULTURE, URINE COMPREHENSIVE

## 2023-01-03 ENCOUNTER — Telehealth: Payer: Self-pay | Admitting: Urology

## 2023-01-03 NOTE — Telephone Encounter (Signed)
Mrs. Niblack has not seen the MyChart message.  Julie Khan, Your urine culture results show you do have an infection.  They did not test the bacteria against the antibiotic Septra.  If you are not feeling better, please let us know so that we can prescribe a different antibiotic.  Otherwise, we will see you in October. Claudio Mondry, PA-C  Would you call her and see how she is feeling and if we need to call in a different antibiotic?

## 2023-01-06 ENCOUNTER — Encounter: Payer: Self-pay | Admitting: *Deleted

## 2023-01-06 NOTE — Telephone Encounter (Signed)
Spoke with patient and advised results, pt is feeling better

## 2023-01-18 NOTE — Progress Notes (Unsigned)
01/19/2023 3:06 PM   Julie Khan 14-Jul-1934 [id number]  Referring provider: Marisue Ivan, MD [phone] [facility] [address] [facility] [place],  [place] [address]  Urological history: 1. Bladder cancer -was told her cancer was stage 2 bladder cancer, dx and treated in [place].  It is unclear whether this was squamous or TCC -dx 19 years ago followed by 6 weeks BCG -last surveillance cystoscopy 11/26/2020-NED -RUS (12/2020) - WNL -cysto (01/2022) - NED   2. rUTI's -contributing factors of age and vaginal atrophy -documented positive urine cultures over the last year  None  3. Cystocele -bladder lift ~ 30 years ago   4. High risk hematuria -Former smoker -CTU (01/2022) - no worrisome GU findings -cysto (01/2022) - NED -non contrast CT (11/2022) - no worrisome GU findings   Chief Complaint  Patient presents with   Follow-up    HPI: Julie Khan is a 88 y.o. female who presents today for urine recheck.      Previous records reviewed.     At her visit on 12/21/2022, Non contrast CT in August for RLQ pain by PCP, no hydro, no stones and no renal or bladder masses.   She has been having 3 to 4 days of suprapubic pressure, dysuria, frequency and voiding small amounts.  She has been taking AZO to control her symptoms.  Patient denies any modifying or aggravating factors.  Patient denies any recent UTI's, gross hematuria, dysuria or suprapubic/flank pain.  Patient denies any fevers, chills, nausea or vomiting.    UA yellow cloudy, specific gravity 1.015, 2+ heme, pH 5.5, 1+ protein, nitrate positive, 2+ leukocytes, greater than 30 WBCs, 3-10 RBCs, 0-10 epithelial cells and many bacteria.  Urine culture was positive for Pseudomonas aeruginosa.    Her symptoms have subsided.  Patient denies any modifying or aggravating factors.  Patient denies any recent UTI's, gross hematuria, dysuria or suprapubic/flank pain.  Patient denies any fevers, chills, nausea or vomiting.    UA  with pyuria and bacteria but greater than 10 squames.  She is having issues with vertigo at this time.  PMH: Past Medical History:  Diagnosis Date   Arthritis    Bladder cancer (HCC)    Bladder cancer (HCC)    Cancer (HCC)    multiple myeloma   Chronic kidney disease    Chronic kidney disease    Dizziness    GERD (gastroesophageal reflux disease)    Hypertension    Multiple myeloma (HCC)    Stroke (HCC)    tia x 2   Tubular adenoma of colon     Surgical History: Past Surgical History:  Procedure Laterality Date   bladder tumor removed     BREAST EXCISIONAL BIOPSY Bilateral 1970   Benign   BREAST SURGERY     bx   CATARACT EXTRACTION W/PHACO Left 12/16/2016   Procedure: CATARACT EXTRACTION PHACO AND INTRAOCULAR LENS PLACEMENT (IOC);  Surgeon: Nevada Crane, MD;  Location: [facility] ORS;  Service: Ophthalmology;  Laterality: Left;  [place] 00:38.0 AP% 14.6 CDE 5.54 Fluid pack lot # [id number]   CATARACT EXTRACTION W/PHACO Right 03/03/2017   Procedure: CATARACT EXTRACTION PHACO AND INTRAOCULAR LENS PLACEMENT (IOC);  Surgeon: Nevada Crane, MD;  Location: [facility] ORS;  Service: Ophthalmology;  Laterality: Right;  Lot # [id number] [place]: 00:29.8 AP%: 10.1 CDE: 3.01   COLONOSCOPY WITH PROPOFOL N/A 07/25/2015   Procedure: COLONOSCOPY WITH PROPOFOL;  Surgeon: Elnita Maxwell, MD;  Location: [facility] ENDOSCOPY;  Service: Endoscopy;  Laterality: N/A;  COLONOSCOPY WITH PROPOFOL N/A 06/29/2017   Procedure: COLONOSCOPY WITH PROPOFOL;  Surgeon: Toledo, Boykin Nearing, MD;  Location: [facility] ENDOSCOPY;  Service: Gastroenterology;  Laterality: N/A;   COLONOSCOPY WITH PROPOFOL N/A 07/14/2022   Procedure: COLONOSCOPY WITH PROPOFOL;  Surgeon: Toledo, Boykin Nearing, MD;  Location: [facility] ENDOSCOPY;  Service: Gastroenterology;  Laterality: N/A;   CORONARY ANGIOGRAPHY N/A 05/24/2019   Procedure: CORONARY ANGIOGRAPHY;  Surgeon: Laurier Nancy, MD;  Location: [facility] INVASIVE CV LAB;  Service: Cardiovascular;  Laterality:  N/A;   ESOPHAGOGASTRODUODENOSCOPY (EGD) WITH PROPOFOL N/A 07/25/2015   Procedure: ESOPHAGOGASTRODUODENOSCOPY (EGD) WITH PROPOFOL;  Surgeon: Elnita Maxwell, MD;  Location: [facility] ENDOSCOPY;  Service: Endoscopy;  Laterality: N/A;   ESOPHAGOGASTRODUODENOSCOPY (EGD) WITH PROPOFOL N/A 06/29/2017   Procedure: ESOPHAGOGASTRODUODENOSCOPY (EGD) WITH PROPOFOL;  Surgeon: Toledo, Boykin Nearing, MD;  Location: [facility] ENDOSCOPY;  Service: Gastroenterology;  Laterality: N/A;   HERNIA REPAIR     LEFT HEART CATH N/A 05/24/2019   Procedure: Left Heart Cath;  Surgeon: Laurier Nancy, MD;  Location: [facility] INVASIVE CV LAB;  Service: Cardiovascular;  Laterality: N/A;    Home Medications:  Allergies as of 01/19/2023       Reactions   Iodine    Seizure Betadine ok Other reaction(s): Other (See Comments) Sneezing Seizure Betadine ok   Sucralfate Rash   Ace Inhibitors Cough   Penicillins Other (See Comments), Rash   Has patient had a PCN reaction causing immediate rash, facial/tongue/throat swelling, SOB or lightheadedness with hypotension: Unknown Has patient had a PCN reaction causing severe rash involving mucus membranes or skin necrosis: Unknown Has patient had a PCN reaction that required hospitalization: Unknown Has patient had a PCN reaction occurring within the last 10 years: Unknown If all of the above answers are "NO", then may proceed with Cephalosporin use. Other reaction(s): Other (See Comments) Has patient had a PCN reaction causing immediate rash, facial/tongue/throat swelling, SOB or lightheadedness with hypotension: Unknown Has patient had a PCN reaction causing severe rash involving mucus membranes or skin necrosis: Unknown Has patient had a PCN reaction that required hospitalization: Unknown Has patient had a PCN reaction occurring within the last 10 years: Unknown If all of the above answers are "NO", then may proceed with Cephalosporin use. Has patient had a PCN reaction causing immediate  rash, facial/tongue/throat swelling, SOB or lightheadedness with hypotension: Unknown Has patient had a PCN reaction causing severe rash involving mucus membranes or skin necrosis: Unknown Has patient had a PCN reaction that required hospitalization: Unknown Has patient had a PCN reaction occurring within the last 10 years: Unknown If all of the above answers are "NO", then may proceed with Cephalosporin use.        Medication List        Accurate as of January 19, 2023  3:06 PM. If you have any questions, ask your nurse or doctor.          meclizine 25 MG tablet Commonly known as: ANTIVERT Take 25 mg by mouth 3 (three) times daily as needed.   metoprolol succinate 25 MG 24 hr tablet Commonly known as: TOPROL-XL Take 50 mg by mouth daily.   nitroGLYCERIN 0.3 MG SL tablet Commonly known as: NITROSTAT Place 1 tablet under the tongue as needed.   pantoprazole 40 MG tablet Commonly known as: PROTONIX Take by mouth.   ranolazine 500 MG 12 hr tablet Commonly known as: RANEXA Take 500 mg by mouth 2 (two) times daily.   rOPINIRole 0.25 MG tablet Commonly known as: REQUIP Take  1 tablet by mouth as needed.   rosuvastatin 10 MG tablet Commonly known as: CRESTOR Take 10 mg by mouth daily.   sulfamethoxazole-trimethoprim 800-160 MG tablet Commonly known as: BACTRIM DS Take 1 tablet by mouth every 12 (twelve) hours.   Vitamin D3 10 MCG (400 UNIT) Caps        Allergies:  Allergies  Allergen Reactions   Iodine     Seizure  Betadine ok Other reaction(s): Other (See Comments) Sneezing Seizure  Betadine ok   Sucralfate Rash   Ace Inhibitors Cough   Penicillins Other (See Comments) and Rash    Has patient had a PCN reaction causing immediate rash, facial/tongue/throat swelling, SOB or lightheadedness with hypotension: Unknown Has patient had a PCN reaction causing severe rash involving mucus membranes or skin necrosis: Unknown Has patient had a PCN reaction that  required hospitalization: Unknown Has patient had a PCN reaction occurring within the last 10 years: Unknown If all of the above answers are "NO", then may proceed with Cephalosporin use.  Other reaction(s): Other (See Comments) Has patient had a PCN reaction causing immediate rash, facial/tongue/throat swelling, SOB or lightheadedness with hypotension: Unknown Has patient had a PCN reaction causing severe rash involving mucus membranes or skin necrosis: Unknown Has patient had a PCN reaction that required hospitalization: Unknown Has patient had a PCN reaction occurring within the last 10 years: Unknown If all of the above answers are "NO", then may proceed with Cephalosporin use. Has patient had a PCN reaction causing immediate rash, facial/tongue/throat swelling, SOB or lightheadedness with hypotension: Unknown Has patient had a PCN reaction causing severe rash involving mucus membranes or skin necrosis: Unknown Has patient had a PCN reaction that required hospitalization: Unknown Has patient had a PCN reaction occurring within the last 10 years: Unknown If all of the above answers are "NO", then may proceed with Cephalosporin use.    Family History: Family History  Problem Relation Age of Onset   Cancer Mother    Colon cancer Mother    Colon polyps Mother    Cancer Sister    Breast cancer Sister 103   Cancer Brother    Bladder Cancer Neg Hx    Kidney cancer Neg Hx     Social History:  reports that she has quit smoking. She has never used smokeless tobacco. She reports that she does not drink alcohol and does not use drugs.  ROS: Pertinent ROS in HPI  Physical Exam: BP (!) 180/85   Pulse 67   Ht 5' (1.524 m)   Wt 168 lb (76.2 kg)   BMI 32.81 kg/m   Constitutional:  Well nourished. Alert and oriented, No acute distress. HEENT: [place] AT, moist mucus membranes.  Trachea midline Cardiovascular: No clubbing, cyanosis, or edema. Respiratory: Normal respiratory effort, no  increased work of breathing. Neurologic: Grossly intact, no focal deficits, moving all 4 extremities. Psychiatric: Normal mood and affect.    Laboratory Data:  Urinalysis See HPI and Epic I have reviewed the labs.    Pertinent Imaging: N/A  Assessment & Plan:    1. High risk hematuria -former smoker -recent work up negative for worrisome GU findings -Recent microscopic hematuria secondary to urinary tract infection -Micro heme is now resolved  3. Bladder cancer -cysto 11/2022 NED -has cysto scheduled for next week  -Urine sent for culture in preparation for instrumentation next week                 Return for cysto next week  with Dr. Apolinar Junes .  These notes generated with voice recognition software. I apologize for typographical errors.  Cloretta Ned  [facility] [address]  [address] [place], [place] [address] [phone]

## 2023-01-19 ENCOUNTER — Ambulatory Visit (INDEPENDENT_AMBULATORY_CARE_PROVIDER_SITE_OTHER): Payer: 59 | Admitting: Urology

## 2023-01-19 ENCOUNTER — Encounter: Payer: Self-pay | Admitting: Urology

## 2023-01-19 VITALS — BP 180/85 | HR 67 | Ht 60.0 in | Wt 168.0 lb

## 2023-01-19 DIAGNOSIS — Z8551 Personal history of malignant neoplasm of bladder: Secondary | ICD-10-CM | POA: Diagnosis not present

## 2023-01-19 DIAGNOSIS — C679 Malignant neoplasm of bladder, unspecified: Secondary | ICD-10-CM

## 2023-01-19 DIAGNOSIS — Z09 Encounter for follow-up examination after completed treatment for conditions other than malignant neoplasm: Secondary | ICD-10-CM

## 2023-01-19 DIAGNOSIS — Z87898 Personal history of other specified conditions: Secondary | ICD-10-CM | POA: Diagnosis not present

## 2023-01-19 DIAGNOSIS — R3989 Other symptoms and signs involving the genitourinary system: Secondary | ICD-10-CM

## 2023-01-19 DIAGNOSIS — R319 Hematuria, unspecified: Secondary | ICD-10-CM | POA: Diagnosis not present

## 2023-01-19 LAB — URINALYSIS, COMPLETE
Bilirubin, UA: NEGATIVE
Glucose, UA: NEGATIVE
Ketones, UA: NEGATIVE
Nitrite, UA: NEGATIVE
Protein,UA: NEGATIVE
RBC, UA: NEGATIVE
Specific Gravity, UA: 1.02 (ref 1.005–1.030)
Urobilinogen, Ur: 0.2 mg/dL (ref 0.2–1.0)
pH, UA: 6 (ref 5.0–7.5)

## 2023-01-19 LAB — MICROSCOPIC EXAMINATION: Epithelial Cells (non renal): >10 /[HPF] — AB (ref 0–10)

## 2023-01-20 ENCOUNTER — Ambulatory Visit
Admission: RE | Admit: 2023-01-20 | Discharge: 2023-01-20 | Disposition: A | Discharge status: Home / Self Care | Payer: 59 | Source: Ambulatory Visit | Attending: Family Medicine | Admitting: Family Medicine

## 2023-01-20 DIAGNOSIS — Z1231 Encounter for screening mammogram for malignant neoplasm of breast: Secondary | ICD-10-CM | POA: Diagnosis not present

## 2023-01-22 LAB — CULTURE, URINE COMPREHENSIVE

## 2023-01-26 ENCOUNTER — Ambulatory Visit (INDEPENDENT_AMBULATORY_CARE_PROVIDER_SITE_OTHER): Payer: 59 | Admitting: Urology

## 2023-01-26 VITALS — BP 145/76 | HR 77 | Ht 60.0 in | Wt 163.1 lb

## 2023-01-26 DIAGNOSIS — C679 Malignant neoplasm of bladder, unspecified: Secondary | ICD-10-CM

## 2023-01-26 DIAGNOSIS — N390 Urinary tract infection, site not specified: Secondary | ICD-10-CM

## 2023-01-26 DIAGNOSIS — Z8551 Personal history of malignant neoplasm of bladder: Secondary | ICD-10-CM | POA: Diagnosis not present

## 2023-01-26 DIAGNOSIS — Z87440 Personal history of urinary (tract) infections: Secondary | ICD-10-CM

## 2023-01-26 DIAGNOSIS — R310 Gross hematuria: Secondary | ICD-10-CM | POA: Diagnosis not present

## 2023-01-26 LAB — MICROSCOPIC EXAMINATION: Epithelial Cells (non renal): >10 /[HPF] — AB (ref 0–10)

## 2023-01-26 LAB — URINALYSIS, COMPLETE
Bilirubin, UA: NEGATIVE
Glucose, UA: NEGATIVE
Ketones, UA: NEGATIVE
Nitrite, UA: NEGATIVE
Protein,UA: NEGATIVE
RBC, UA: NEGATIVE
Specific Gravity, UA: <1.005 — ABNORMAL LOW (ref 1.005–1.030)
Urobilinogen, Ur: 0.2 mg/dL (ref 0.2–1.0)
pH, UA: 5.5 (ref 5.0–7.5)

## 2023-01-26 NOTE — Progress Notes (Signed)
   01/26/23  CC:  Chief Complaint  Patient presents with   Cysto     HPI: Julie Khan is a 88 y.o.female who returns today for scheduled cytoscopy.   She has a remote history of bladder cancer greater then 20 years ago. She underwent BCG x6. She stopped doing surveillance cystoscopies back in 2018. She was treated in [place].   Most recent interval imaging in the form of CT abdomen pelvis with contrast on 12/14/2022 which shows no GU pathology.  This was performed by her PCP for right lower quadrant pain.  Notably, she was seen and evaluated by she had an echo and last month for UTI.  She grew Pseudomonas.  Her urinary symptoms have resolved.  Urinalysis is not improved.   NED. A&Ox3.   No respiratory distress   Abd soft, NT, ND Normal external genitalia with patent urethral meatus  Cystoscopy Procedure Note  Patient identification was confirmed, informed consent was obtained, and patient was prepped using Betadine solution.  Lidocaine jelly was administered per urethral meatus.    Procedure: - Flexible cystoscope introduced, without any difficulty.   - Thorough search of the bladder revealed:    normal urethral meatus    normal urothelium     no stones    no ulcers     no tumors    no urethral polyps    no trabeculation  - Ureteral orifices were normal in position and appearance.  Post-Procedure: - Patient tolerated the procedure well  Assessment/ Plan:  History of bladder cancer - NED, cysto is negative -Again, we had a shared decision making about whether or not to continue to proceed with annual cystoscopy.  She is extremely healthy for her age.  We have no information about her bladder cancer history in terms of pathology and risk.  Ultimately, she mentions today that if she had a recurrence, she would want to intervene as such, has elected to continue annual cystoscopy.   Follow-up cystoscopy in 1 year   Vanna Scotland, MD

## 2023-01-31 ENCOUNTER — Telehealth: Payer: Self-pay

## 2023-01-31 DIAGNOSIS — N1832 Chronic kidney disease, stage 3b: Secondary | ICD-10-CM | POA: Diagnosis not present

## 2023-01-31 DIAGNOSIS — I129 Hypertensive chronic kidney disease with stage 1 through stage 4 chronic kidney disease, or unspecified chronic kidney disease: Secondary | ICD-10-CM | POA: Diagnosis not present

## 2023-01-31 NOTE — Telephone Encounter (Signed)
FYI Received a fax from access nurse that patient called on 10/25 at 4:29 pm stating she has been having burning and pressure with urination. Had cystoscopy on 01/26/23 and patient said she usually gets antibiotic after this procedure but she did not this time. On call provider sent in Bactrim 800/160 mg and advised patient to take this medication 1 table once daily for 5 days. I called patient to follow up on how she is doing today and she states she is much better, taking the medication and symptoms resolved at this time.

## 2023-02-02 DIAGNOSIS — E875 Hyperkalemia: Secondary | ICD-10-CM | POA: Diagnosis not present

## 2023-02-02 DIAGNOSIS — I129 Hypertensive chronic kidney disease with stage 1 through stage 4 chronic kidney disease, or unspecified chronic kidney disease: Secondary | ICD-10-CM | POA: Diagnosis not present

## 2023-02-02 DIAGNOSIS — C9001 Multiple myeloma in remission: Secondary | ICD-10-CM | POA: Diagnosis not present

## 2023-02-02 DIAGNOSIS — N184 Chronic kidney disease, stage 4 (severe): Secondary | ICD-10-CM | POA: Diagnosis not present

## 2023-02-02 DIAGNOSIS — I10 Essential (primary) hypertension: Secondary | ICD-10-CM | POA: Diagnosis not present

## 2023-02-07 ENCOUNTER — Telehealth: Payer: Self-pay

## 2023-02-07 NOTE — Telephone Encounter (Signed)
Pt calls triage line and states that she has completed 5 days of antibiotic Bactrim and is still having UTI sx including urinary frequency, abdominal pressure and dysuria.  Called pt advised that she needs appt for ua/cx as she was treated empirically before and bacteria may not be sensitive to Bactrim. Pt voiced understanding. Denies fever, chills or flank pain. Pt scheduled.

## 2023-02-08 ENCOUNTER — Telehealth: Payer: Self-pay | Admitting: *Deleted

## 2023-02-08 ENCOUNTER — Ambulatory Visit (INDEPENDENT_AMBULATORY_CARE_PROVIDER_SITE_OTHER): Payer: 59 | Admitting: Physician Assistant

## 2023-02-08 VITALS — BP 159/90 | HR 73

## 2023-02-08 DIAGNOSIS — R300 Dysuria: Secondary | ICD-10-CM

## 2023-02-08 DIAGNOSIS — R310 Gross hematuria: Secondary | ICD-10-CM | POA: Diagnosis not present

## 2023-02-08 LAB — URINALYSIS, COMPLETE
Bilirubin, UA: NEGATIVE
Glucose, UA: NEGATIVE
Ketones, UA: NEGATIVE
Nitrite, UA: NEGATIVE
Specific Gravity, UA: 1.02 (ref 1.005–1.030)
Urobilinogen, Ur: 0.2 mg/dL (ref 0.2–1.0)
pH, UA: 6 (ref 5.0–7.5)

## 2023-02-08 LAB — MICROSCOPIC EXAMINATION: Epithelial Cells (non renal): >10 /[HPF] — AB (ref 0–10)

## 2023-02-08 MED ORDER — CIPROFLOXACIN HCL 250 MG PO TABS: 250.0000 mg | ORAL_TABLET | Freq: Two times a day (BID) | ORAL | 0 refills | Status: DC

## 2023-02-08 MED ORDER — CIPROFLOXACIN HCL 250 MG PO TABS
250.0000 mg | ORAL_TABLET | Freq: Two times a day (BID) | ORAL | 0 refills | Status: AC
Start: 2023-02-08 — End: 2023-02-13

## 2023-02-08 NOTE — Telephone Encounter (Signed)
I called pharmacy to informed them that Sam corrected the Rx to 10 tablets, BID for 5 days. Pharmacy states pt will have to pick up the new Rx on 11/07 since pt already picked up the 5 tablets. Pt was informed of this and voiced understanding.

## 2023-02-08 NOTE — Addendum Note (Signed)
Addended by: Debarah Crape on: 02/08/2023 03:20 PM   Modules accepted: Orders

## 2023-02-08 NOTE — Telephone Encounter (Signed)
Patient called regarding prescription. It is only for 5 tablets and it says to take twice per day. She is requesting a call to let her know if this is correct, or if she needs more medication. Please advise patient.

## 2023-02-08 NOTE — Telephone Encounter (Signed)
[facility] calling triage to question the quantity of Cipro that was sent in, the sig is for BID but only 5 tablets where sent in. Per [facility] if pt needs more we need to send a new rx

## 2023-02-08 NOTE — Progress Notes (Addendum)
02/08/2023 10:44 AM   Julie Khan October 30, 1934 [id number]  CC: Chief Complaint  Patient presents with   Follow-up   HPI: Julie Khan is a 88 y.o. female with PMH remote bladder cancer, recurrent UTI, cystocele, and high risk hematuria who underwent surveillance cystoscopy with Dr. Apolinar Junes on 01/26/2023 who presents today for evaluation of persistent UTI.   She developed dysuria after cystoscopy and was put on Bactrim DS twice daily x 5 days empirically.  No culture available.  Today she reports her symptoms persisted despite Bactrim.  In-office UA today positive for trace intact blood, 1+ protein, and 1+ leukocytes; urine microscopy with 11-30 WBCs/HPF, 3-10 RBCs/HPF, >10 epithelial cells/hpf, and many bacteria.   PMH: Past Medical History:  Diagnosis Date   Arthritis    Bladder cancer (HCC)    Bladder cancer (HCC)    Cancer (HCC)    multiple myeloma   Chronic kidney disease    Chronic kidney disease    Dizziness    GERD (gastroesophageal reflux disease)    Hypertension    Multiple myeloma (HCC)    Stroke (HCC)    tia x 2   Tubular adenoma of colon     Surgical History: Past Surgical History:  Procedure Laterality Date   bladder tumor removed     BREAST EXCISIONAL BIOPSY Bilateral 1970   Benign   BREAST SURGERY     bx   CATARACT EXTRACTION W/PHACO Left 12/16/2016   Procedure: CATARACT EXTRACTION PHACO AND INTRAOCULAR LENS PLACEMENT (IOC);  Surgeon: Nevada Crane, MD;  Location: [facility] ORS;  Service: Ophthalmology;  Laterality: Left;  [place] 00:38.0 AP% 14.6 CDE 5.54 Fluid pack lot # [id number]   CATARACT EXTRACTION W/PHACO Right 03/03/2017   Procedure: CATARACT EXTRACTION PHACO AND INTRAOCULAR LENS PLACEMENT (IOC);  Surgeon: Nevada Crane, MD;  Location: [facility];  Service: Ophthalmology;  Laterality: Right;  Lot # [id number] H [place]: 00:29.8 AP%: 10.1 CDE: 3.01   COLONOSCOPY WITH PROPOFOL N/A 07/25/2015   Procedure: COLONOSCOPY WITH PROPOFOL;  Surgeon:  Elnita Maxwell, MD;  Location: [facility] ENDOSCOPY;  Service: Endoscopy;  Laterality: N/A;   COLONOSCOPY WITH PROPOFOL N/A 06/29/2017   Procedure: COLONOSCOPY WITH PROPOFOL;  Surgeon: Toledo, Boykin Nearing, MD;  Location: [facility] ENDOSCOPY;  Service: Gastroenterology;  Laterality: N/A;   COLONOSCOPY WITH PROPOFOL N/A 07/14/2022   Procedure: COLONOSCOPY WITH PROPOFOL;  Surgeon: Toledo, Boykin Nearing, MD;  Location: [facility] ENDOSCOPY;  Service: Gastroenterology;  Laterality: N/A;   CORONARY ANGIOGRAPHY N/A 05/24/2019   Procedure: CORONARY ANGIOGRAPHY;  Surgeon: Laurier Nancy, MD;  Location: [facility] INVASIVE CV LAB;  Service: Cardiovascular;  Laterality: N/A;   ESOPHAGOGASTRODUODENOSCOPY (EGD) WITH PROPOFOL N/A 07/25/2015   Procedure: ESOPHAGOGASTRODUODENOSCOPY (EGD) WITH PROPOFOL;  Surgeon: Elnita Maxwell, MD;  Location: [facility] ENDOSCOPY;  Service: Endoscopy;  Laterality: N/A;   ESOPHAGOGASTRODUODENOSCOPY (EGD) WITH PROPOFOL N/A 06/29/2017   Procedure: ESOPHAGOGASTRODUODENOSCOPY (EGD) WITH PROPOFOL;  Surgeon: Toledo, Boykin Nearing, MD;  Location: [facility] ENDOSCOPY;  Service: Gastroenterology;  Laterality: N/A;   HERNIA REPAIR     LEFT HEART CATH N/A 05/24/2019   Procedure: Left Heart Cath;  Surgeon: Laurier Nancy, MD;  Location: [facility] INVASIVE CV LAB;  Service: Cardiovascular;  Laterality: N/A;    Home Medications:  Allergies as of 02/08/2023       Reactions   Iodine    Seizure Betadine ok Other reaction(s): Other (See Comments) Sneezing Seizure Betadine ok   Sucralfate Rash   Ace Inhibitors Cough   Penicillins Other (See Comments), Rash  Has patient had a PCN reaction causing immediate rash, facial/tongue/throat swelling, SOB or lightheadedness with hypotension: Unknown Has patient had a PCN reaction causing severe rash involving mucus membranes or skin necrosis: Unknown Has patient had a PCN reaction that required hospitalization: Unknown Has patient had a PCN reaction occurring within the last 10 years:  Unknown If all of the above answers are "NO", then may proceed with Cephalosporin use. Other reaction(s): Other (See Comments) Has patient had a PCN reaction causing immediate rash, facial/tongue/throat swelling, SOB or lightheadedness with hypotension: Unknown Has patient had a PCN reaction causing severe rash involving mucus membranes or skin necrosis: Unknown Has patient had a PCN reaction that required hospitalization: Unknown Has patient had a PCN reaction occurring within the last 10 years: Unknown If all of the above answers are "NO", then may proceed with Cephalosporin use. Has patient had a PCN reaction causing immediate rash, facial/tongue/throat swelling, SOB or lightheadedness with hypotension: Unknown Has patient had a PCN reaction causing severe rash involving mucus membranes or skin necrosis: Unknown Has patient had a PCN reaction that required hospitalization: Unknown Has patient had a PCN reaction occurring within the last 10 years: Unknown If all of the above answers are "NO", then may proceed with Cephalosporin use.        Medication List        Accurate as of February 08, 2023 10:44 AM. If you have any questions, ask your nurse or doctor.          ciprofloxacin 250 MG tablet Commonly known as: CIPRO Take 1 tablet (250 mg total) by mouth 2 (two) times daily.   meclizine 25 MG tablet Commonly known as: ANTIVERT Take 25 mg by mouth 3 (three) times daily as needed.   metoprolol succinate 25 MG 24 hr tablet Commonly known as: TOPROL-XL Take 50 mg by mouth daily.   nitroGLYCERIN 0.3 MG SL tablet Commonly known as: NITROSTAT Place 1 tablet under the tongue as needed.   pantoprazole 40 MG tablet Commonly known as: PROTONIX Take by mouth.   ranolazine 500 MG 12 hr tablet Commonly known as: RANEXA Take 500 mg by mouth 2 (two) times daily.   rOPINIRole 0.25 MG tablet Commonly known as: REQUIP Take 1 tablet by mouth as needed.   rosuvastatin 10 MG  tablet Commonly known as: CRESTOR Take 10 mg by mouth daily.   Vitamin D3 10 MCG (400 UNIT) Caps        Allergies:  Allergies  Allergen Reactions   Iodine     Seizure  Betadine ok Other reaction(s): Other (See Comments) Sneezing Seizure  Betadine ok   Sucralfate Rash   Ace Inhibitors Cough   Penicillins Other (See Comments) and Rash    Has patient had a PCN reaction causing immediate rash, facial/tongue/throat swelling, SOB or lightheadedness with hypotension: Unknown Has patient had a PCN reaction causing severe rash involving mucus membranes or skin necrosis: Unknown Has patient had a PCN reaction that required hospitalization: Unknown Has patient had a PCN reaction occurring within the last 10 years: Unknown If all of the above answers are "NO", then may proceed with Cephalosporin use.  Other reaction(s): Other (See Comments) Has patient had a PCN reaction causing immediate rash, facial/tongue/throat swelling, SOB or lightheadedness with hypotension: Unknown Has patient had a PCN reaction causing severe rash involving mucus membranes or skin necrosis: Unknown Has patient had a PCN reaction that required hospitalization: Unknown Has patient had a PCN reaction occurring within the last 10 years:  Unknown If all of the above answers are "NO", then may proceed with Cephalosporin use. Has patient had a PCN reaction causing immediate rash, facial/tongue/throat swelling, SOB or lightheadedness with hypotension: Unknown Has patient had a PCN reaction causing severe rash involving mucus membranes or skin necrosis: Unknown Has patient had a PCN reaction that required hospitalization: Unknown Has patient had a PCN reaction occurring within the last 10 years: Unknown If all of the above answers are "NO", then may proceed with Cephalosporin use.    Family History: Family History  Problem Relation Age of Onset   Cancer Mother    Colon cancer Mother    Colon polyps Mother     Cancer Sister    Breast cancer Sister 22   Cancer Brother    Bladder Cancer Neg Hx    Kidney cancer Neg Hx     Social History:   reports that she has quit smoking. She has never used smokeless tobacco. She reports that she does not drink alcohol and does not use drugs.  Physical Exam: There were no vitals taken for this visit.  Constitutional:  Alert and oriented, no acute distress, nontoxic appearing HEENT: [place], AT Cardiovascular: No clubbing, cyanosis, or edema Respiratory: Normal respiratory effort, no increased work of breathing Skin: No rashes, bruises or suspicious lesions Neurologic: Grossly intact, no focal deficits, moving all 4 extremities Psychiatric: Normal mood and affect  Laboratory Data: Results for orders placed or performed in visit on 02/08/23  Microscopic Examination   Urine  Result Value Ref Range   WBC, UA 11-30 (A) 0 - 5 /hpf   RBC, Urine 3-10 (A) 0 - 2 /hpf   Epithelial Cells (non renal) >10 (A) 0 - 10 /hpf   Casts Present (A) None seen /lpf   Cast Type Hyaline casts N/A   Mucus, UA Present (A) Not Estab.   Bacteria, UA Many (A) None seen/Few  Urinalysis, Complete  Result Value Ref Range   Specific Gravity, UA 1.020 1.005 - 1.030   pH, UA 6.0 5.0 - 7.5   Color, UA Amber (A) Yellow   Appearance Ur Hazy (A) Clear   Leukocytes,UA 1+ (A) Negative   Protein,UA 1+ (A) Negative/Trace   Glucose, UA Negative Negative   Ketones, UA Negative Negative   RBC, UA Trace (A) Negative   Bilirubin, UA Negative Negative   Urobilinogen, Ur 0.2 0.2 - 1.0 mg/dL   Nitrite, UA Negative Negative   Microscopic Examination See below:    Assessment & Plan:   1. Dysuria UA appears grossly infected, though contaminated.  Will start empiric Cipro and send for culture for further evaluation.  No need to prove resolution of micro heme given active surveillance. - Urinalysis, Complete - CULTURE, URINE COMPREHENSIVE - ciprofloxacin (CIPRO) 250 MG tablet; Take 1 tablet (250 mg  total) by mouth 2 (two) times daily.  Dispense: 10 tablet; Refill: 0  Return if symptoms worsen or fail to improve.  Carman Ching, PA-C  [facility] [place] [address], [address] [place], [place] [address] [phone]

## 2023-02-11 LAB — CULTURE, URINE COMPREHENSIVE

## 2023-02-16 ENCOUNTER — Inpatient Hospital Stay: Payer: 59 | Attending: Oncology

## 2023-02-16 DIAGNOSIS — N189 Chronic kidney disease, unspecified: Secondary | ICD-10-CM | POA: Insufficient documentation

## 2023-02-16 DIAGNOSIS — D472 Monoclonal gammopathy: Secondary | ICD-10-CM | POA: Diagnosis not present

## 2023-02-16 DIAGNOSIS — I129 Hypertensive chronic kidney disease with stage 1 through stage 4 chronic kidney disease, or unspecified chronic kidney disease: Secondary | ICD-10-CM | POA: Insufficient documentation

## 2023-02-16 DIAGNOSIS — Z87891 Personal history of nicotine dependence: Secondary | ICD-10-CM | POA: Insufficient documentation

## 2023-02-16 LAB — CBC WITH DIFFERENTIAL/PLATELET
Abs Immature Granulocytes: 0.01 10*3/uL (ref 0.00–0.07)
Basophils Absolute: 0 10*3/uL (ref 0.0–0.1)
Basophils Relative: 1 %
Eosinophils Absolute: 0 10*3/uL (ref 0.0–0.5)
Eosinophils Relative: 1 %
HCT: 36.9 % (ref 36.0–46.0)
Hemoglobin: 11.9 g/dL — ABNORMAL LOW (ref 12.0–15.0)
Immature Granulocytes: 0 %
Lymphocytes Relative: 44 %
Lymphs Abs: 2 10*3/uL (ref 0.7–4.0)
MCH: 30.2 pg (ref 26.0–34.0)
MCHC: 32.2 g/dL (ref 30.0–36.0)
MCV: 93.7 fL (ref 80.0–100.0)
Monocytes Absolute: 0.4 10*3/uL (ref 0.1–1.0)
Monocytes Relative: 8 %
Neutro Abs: 2 10*3/uL (ref 1.7–7.7)
Neutrophils Relative %: 46 %
Platelets: 177 10*3/uL (ref 150–400)
RBC: 3.94 MIL/uL (ref 3.87–5.11)
RDW: 14 % (ref 11.5–15.5)
WBC: 4.4 10*3/uL (ref 4.0–10.5)
nRBC: 0 % (ref 0.0–0.2)

## 2023-02-16 LAB — BASIC METABOLIC PANEL
Anion gap: 4 — ABNORMAL LOW (ref 5–15)
BUN: 31 mg/dL — ABNORMAL HIGH (ref 8–23)
CO2: 24 mmol/L (ref 22–32)
Calcium: 8.8 mg/dL — ABNORMAL LOW (ref 8.9–10.3)
Chloride: 106 mmol/L (ref 98–111)
Creatinine, Ser: 1.35 mg/dL — ABNORMAL HIGH (ref 0.44–1.00)
GFR, Estimated: 38 mL/min — ABNORMAL LOW (ref >60)
Glucose, Bld: 90 mg/dL (ref 70–99)
Potassium: 4.1 mmol/L (ref 3.5–5.1)
Sodium: 134 mmol/L — ABNORMAL LOW (ref 135–145)

## 2023-02-17 LAB — KAPPA/LAMBDA LIGHT CHAINS
Kappa free light chain: 289.5 mg/L — ABNORMAL HIGH (ref 3.3–19.4)
Kappa, lambda light chain ratio: 25.62 — ABNORMAL HIGH (ref 0.26–1.65)
Lambda free light chains: 11.3 mg/L (ref 5.7–26.3)

## 2023-02-17 LAB — IGG, IGA, IGM
IgA: 60 mg/dL — ABNORMAL LOW (ref 64–422)
IgG (Immunoglobin G), Serum: 4436 mg/dL — ABNORMAL HIGH (ref 586–1602)
IgM (Immunoglobulin M), Srm: 129 mg/dL (ref 26–217)

## 2023-02-21 LAB — PROTEIN ELECTROPHORESIS, SERUM
A/G Ratio: 0.6 — ABNORMAL LOW (ref 0.7–1.7)
Albumin ELP: 3.7 g/dL (ref 2.9–4.4)
Alpha-1-Globulin: 0.2 g/dL (ref 0.0–0.4)
Alpha-2-Globulin: 0.6 g/dL (ref 0.4–1.0)
Beta Globulin: 1 g/dL (ref 0.7–1.3)
Gamma Globulin: 3.9 g/dL — ABNORMAL HIGH (ref 0.4–1.8)
Globulin, Total: 5.8 g/dL — ABNORMAL HIGH (ref 2.2–3.9)
M-Spike, %: 3.6 g/dL — ABNORMAL HIGH
Total Protein ELP: 9.5 g/dL — ABNORMAL HIGH (ref 6.0–8.5)

## 2023-02-24 ENCOUNTER — Encounter: Payer: Self-pay | Admitting: Oncology

## 2023-02-24 ENCOUNTER — Inpatient Hospital Stay (HOSPITAL_BASED_OUTPATIENT_CLINIC_OR_DEPARTMENT_OTHER): Payer: 59 | Admitting: Oncology

## 2023-02-24 VITALS — BP 163/90 | HR 69 | Temp 96.5°F | Resp 16 | Ht 60.0 in | Wt 162.0 lb

## 2023-02-24 DIAGNOSIS — I129 Hypertensive chronic kidney disease with stage 1 through stage 4 chronic kidney disease, or unspecified chronic kidney disease: Secondary | ICD-10-CM | POA: Diagnosis not present

## 2023-02-24 DIAGNOSIS — D472 Monoclonal gammopathy: Secondary | ICD-10-CM | POA: Diagnosis not present

## 2023-02-24 DIAGNOSIS — N189 Chronic kidney disease, unspecified: Secondary | ICD-10-CM | POA: Diagnosis not present

## 2023-02-24 DIAGNOSIS — Z87891 Personal history of nicotine dependence: Secondary | ICD-10-CM | POA: Diagnosis not present

## 2023-02-24 NOTE — Progress Notes (Signed)
[facility]  Telephone:([phone]) [phone] Fax:(336) [phone]  ID: Julie Khan OB: 12-Apr-1934  MR#: [id number]  HKV#:[id number]  Patient Care Team: Marisue Ivan, MD as PCP - General (Family Medicine) Jeralyn Ruths, MD as Consulting Physician (Oncology)   CHIEF COMPLAINT: Smoldering myeloma  INTERVAL HISTORY: Patient returns to clinic today for repeat laboratory work and routine 46-month evaluation.  She continues to feel well and remains asymptomatic.  She continues to remain active. She denies any weakness or fatigue.  She has no neurologic complaints.  She denies any bony pain.  She denies any recent fevers or illnesses.  She has a good appetite and denies weight loss.  She denies any chest pain, shortness of breath, cough, or hemoptysis.  She denies any nausea, vomiting, constipation, or diarrhea.  Patient offers no specific complaints today.  REVIEW OF SYSTEMS:   Review of Systems  Constitutional: Negative.  Negative for fever, malaise/fatigue and weight loss.  Respiratory: Negative.  Negative for cough and shortness of breath.   Cardiovascular: Negative.  Negative for chest pain and leg swelling.  Gastrointestinal: Negative.  Negative for abdominal pain, blood in stool and melena.  Genitourinary: Negative.  Negative for dysuria.  Musculoskeletal: Negative.  Negative for back pain.  Skin: Negative.  Negative for rash.  Neurological: Negative.  Negative for sensory change, focal weakness and weakness.  Psychiatric/Behavioral: Negative.  The patient is not nervous/anxious.     As per HPI. Otherwise, a complete review of systems is negative.  PAST MEDICAL HISTORY: Past Medical History:  Diagnosis Date   Arthritis    Bladder cancer (HCC)    Bladder cancer (HCC)    Cancer (HCC)    multiple myeloma   Chronic kidney disease    Chronic kidney disease    Dizziness    GERD (gastroesophageal reflux disease)    Hypertension    Multiple myeloma (HCC)     Stroke (HCC)    tia x 2   Tubular adenoma of colon     PAST SURGICAL HISTORY: Past Surgical History:  Procedure Laterality Date   bladder tumor removed     BREAST EXCISIONAL BIOPSY Bilateral 1970   Benign   BREAST SURGERY     bx   CATARACT EXTRACTION W/PHACO Left 12/16/2016   Procedure: CATARACT EXTRACTION PHACO AND INTRAOCULAR LENS PLACEMENT (IOC);  Surgeon: Nevada Crane, MD;  Location: [facility] ORS;  Service: Ophthalmology;  Laterality: Left;  [place] 00:38.0 AP% 14.6 CDE 5.54 Fluid pack lot # [id number]   CATARACT EXTRACTION W/PHACO Right 03/03/2017   Procedure: CATARACT EXTRACTION PHACO AND INTRAOCULAR LENS PLACEMENT (IOC);  Surgeon: Nevada Crane, MD;  Location: [facility] ORS;  Service: Ophthalmology;  Laterality: Right;  Lot # [id number] H [place]: 00:29.8 AP%: 10.1 CDE: 3.01   COLONOSCOPY WITH PROPOFOL N/A 07/25/2015   Procedure: COLONOSCOPY WITH PROPOFOL;  Surgeon: Elnita Maxwell, MD;  Location: [facility] ENDOSCOPY;  Service: Endoscopy;  Laterality: N/A;   COLONOSCOPY WITH PROPOFOL N/A 06/29/2017   Procedure: COLONOSCOPY WITH PROPOFOL;  Surgeon: Toledo, Boykin Nearing, MD;  Location: [facility] ENDOSCOPY;  Service: Gastroenterology;  Laterality: N/A;   COLONOSCOPY WITH PROPOFOL N/A 07/14/2022   Procedure: COLONOSCOPY WITH PROPOFOL;  Surgeon: Toledo, Boykin Nearing, MD;  Location: [facility] ENDOSCOPY;  Service: Gastroenterology;  Laterality: N/A;   CORONARY ANGIOGRAPHY N/A 05/24/2019   Procedure: CORONARY ANGIOGRAPHY;  Surgeon: Laurier Nancy, MD;  Location: [facility] INVASIVE CV LAB;  Service: Cardiovascular;  Laterality: N/A;   ESOPHAGOGASTRODUODENOSCOPY (EGD) WITH PROPOFOL N/A 07/25/2015   Procedure: ESOPHAGOGASTRODUODENOSCOPY (EGD)  WITH PROPOFOL;  Surgeon: Elnita Maxwell, MD;  Location: [facility] ENDOSCOPY;  Service: Endoscopy;  Laterality: N/A;   ESOPHAGOGASTRODUODENOSCOPY (EGD) WITH PROPOFOL N/A 06/29/2017   Procedure: ESOPHAGOGASTRODUODENOSCOPY (EGD) WITH PROPOFOL;  Surgeon: Toledo, Boykin Nearing, MD;  Location: [facility]  ENDOSCOPY;  Service: Gastroenterology;  Laterality: N/A;   HERNIA REPAIR     LEFT HEART CATH N/A 05/24/2019   Procedure: Left Heart Cath;  Surgeon: Laurier Nancy, MD;  Location: [facility] INVASIVE CV LAB;  Service: Cardiovascular;  Laterality: N/A;    FAMILY HISTORY: Family History  Problem Relation Age of Onset   Cancer Mother    Colon cancer Mother    Colon polyps Mother    Cancer Sister    Breast cancer Sister 55   Cancer Brother    Bladder Cancer Neg Hx    Kidney cancer Neg Hx     ADVANCED DIRECTIVES (Y/N):  N  HEALTH MAINTENANCE: Social History   Tobacco Use   Smoking status: Former   Smokeless tobacco: Never  Vaping Use   Vaping status: Never Used  Substance Use Topics   Alcohol use: No   Drug use: No     Colonoscopy:  PAP:  Bone density:  Lipid panel:  Allergies  Allergen Reactions   Iodine     Seizure  Betadine ok Other reaction(s): Other (See Comments) Sneezing Seizure  Betadine ok   Sucralfate Rash   Ace Inhibitors Cough   Penicillins Other (See Comments) and Rash    Has patient had a PCN reaction causing immediate rash, facial/tongue/throat swelling, SOB or lightheadedness with hypotension: Unknown Has patient had a PCN reaction causing severe rash involving mucus membranes or skin necrosis: Unknown Has patient had a PCN reaction that required hospitalization: Unknown Has patient had a PCN reaction occurring within the last 10 years: Unknown If all of the above answers are "NO", then may proceed with Cephalosporin use.  Other reaction(s): Other (See Comments) Has patient had a PCN reaction causing immediate rash, facial/tongue/throat swelling, SOB or lightheadedness with hypotension: Unknown Has patient had a PCN reaction causing severe rash involving mucus membranes or skin necrosis: Unknown Has patient had a PCN reaction that required hospitalization: Unknown Has patient had a PCN reaction occurring within the last 10 years: Unknown If all of  the above answers are "NO", then may proceed with Cephalosporin use. Has patient had a PCN reaction causing immediate rash, facial/tongue/throat swelling, SOB or lightheadedness with hypotension: Unknown Has patient had a PCN reaction causing severe rash involving mucus membranes or skin necrosis: Unknown Has patient had a PCN reaction that required hospitalization: Unknown Has patient had a PCN reaction occurring within the last 10 years: Unknown If all of the above answers are "NO", then may proceed with Cephalosporin use.    Current Outpatient Medications  Medication Sig Dispense Refill   Cholecalciferol (VITAMIN D3) 10 MCG (400 UNIT) CAPS      meclizine (ANTIVERT) 25 MG tablet Take 25 mg by mouth 3 (three) times daily as needed.  1   metoprolol succinate (TOPROL-XL) 25 MG 24 hr tablet Take 50 mg by mouth daily.      nitroGLYCERIN (NITROSTAT) 0.3 MG SL tablet Place 1 tablet under the tongue as needed.     pantoprazole (PROTONIX) 40 MG tablet Take by mouth.     ranolazine (RANEXA) 500 MG 12 hr tablet Take 500 mg by mouth 2 (two) times daily.     rOPINIRole (REQUIP) 0.25 MG tablet Take 1 tablet by mouth as needed.  rosuvastatin (CRESTOR) 10 MG tablet Take 10 mg by mouth daily.     No current facility-administered medications for this visit.   Facility-Administered Medications Ordered in Other Visits  Medication Dose Route Frequency Provider Last Rate Last Admin   sodium chloride flush (NS) 0.9 % injection 3 mL  3 mL Intravenous Q12H Adrian Blackwater A, MD        OBJECTIVE: Vitals:   02/24/23 1345  BP: (!) 163/90  Pulse: 69  Resp: 16  Temp: (!) 96.5 F (35.8 C)  SpO2: 99%     Body mass index is 31.64 kg/m.    ECOG FS:0 - Asymptomatic  General: Well-developed, well-nourished, no acute distress. Eyes: Pink conjunctiva, anicteric sclera. HEENT: Normocephalic, moist mucous membranes. Lungs: No audible wheezing or coughing. Heart: Regular rate and rhythm. Abdomen: Soft,  nontender, no obvious distention. Musculoskeletal: No edema, cyanosis, or clubbing. Neuro: Alert, answering all questions appropriately. Cranial nerves grossly intact. Skin: No rashes or petechiae noted. Psych: Normal affect.  LAB RESULTS:  Lab Results  Component Value Date   NA 134 (L) 02/16/2023   K 4.1 02/16/2023   CL 106 02/16/2023   CO2 24 02/16/2023   GLUCOSE 90 02/16/2023   BUN 31 (H) 02/16/2023   CREATININE 1.35 (H) 02/16/2023   CALCIUM 8.8 (L) 02/16/2023   PROT 9.8 (H) 04/02/2022   ALBUMIN 3.4 (L) 04/02/2022   AST 34 04/02/2022   ALT 17 04/02/2022   ALKPHOS 113 04/02/2022   BILITOT 0.5 04/02/2022   GFRNONAA 38 (L) 02/16/2023   GFRAA 33 (L) 11/19/2019    Lab Results  Component Value Date   WBC 4.4 02/16/2023   NEUTROABS 2.0 02/16/2023   HGB 11.9 (L) 02/16/2023   HCT 36.9 02/16/2023   MCV 93.7 02/16/2023   PLT 177 02/16/2023      STUDIES: No results found.  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  Repeat bone marrow biopsy on May 19, 2020 was essentially unchanged with approximately 15% plasma cells with monosomy 13 cytogenetics.  Her most recent skeletal survey on April 10, 2020 reviewed independently with no radiographic sign of multiple myeloma or progressive disease.  Previously, patient's M spike was relatively stable between 2.0 and 2.5.  Since December 2021 her M spike has ranged between 3.1 and 3.6.  Her most recent result is 3.6.  IgG levels have ranged between 3954 and 4640 over the same timeframe, her most recent result is 4436.  Kappa free light chains have ranged between 151.1 and 307.2 since April 2018.  Her most recent result is 289.5.  No intervention is needed.  Patient does not require repeat bone marrow biopsy or treatment at this time.  Return to clinic in 4 months for routine evaluation.    Renal insufficiency: Mildly improved.  Patient's most recent creatinine is 1.35.  Unrelated to underlying smoldering myeloma.  Continue follow-up with nephrology as scheduled. Anemia: Essentially resolved. Hypertension: Blood pressure is moderately elevated today.  Continue monitoring and treatment per primary care.  Jeralyn Ruths, MD   02/24/2023 2:15 PM

## 2023-03-14 ENCOUNTER — Telehealth: Payer: Self-pay | Admitting: Urology

## 2023-03-14 NOTE — Telephone Encounter (Signed)
Appt made for tomorrow, Pt aware.

## 2023-03-14 NOTE — Telephone Encounter (Signed)
Please offer her an office visit with UA.

## 2023-03-14 NOTE — Telephone Encounter (Signed)
Patient called regarding symptoms of light blood in her urine occasionally since she had cysto in October. She has had some occasional burning as well. Is this normal after cysto, or does she need to be seen again. She was seen by Sam on 02/08/23. Please call patient to advise.

## 2023-03-15 ENCOUNTER — Ambulatory Visit: Payer: 59 | Admitting: Physician Assistant

## 2023-03-15 ENCOUNTER — Encounter: Payer: Self-pay | Admitting: Physician Assistant

## 2023-03-15 VITALS — BP 158/82 | HR 81 | Ht 60.0 in | Wt 162.0 lb

## 2023-03-15 DIAGNOSIS — N9489 Other specified conditions associated with female genital organs and menstrual cycle: Secondary | ICD-10-CM | POA: Diagnosis not present

## 2023-03-15 DIAGNOSIS — R310 Gross hematuria: Secondary | ICD-10-CM | POA: Diagnosis not present

## 2023-03-15 MED ORDER — PREMARIN 0.625 MG/GM VA CREA: TOPICAL_CREAM | VAGINAL | 4 refills | Status: DC

## 2023-03-15 NOTE — Progress Notes (Unsigned)
03/15/2023 5:09 PM   Julie Khan [birth year] [id number]  CC: Chief Complaint  Patient presents with   Dysuria   HPI: Julie Khan is a 88 y.o. female with PMH of bladder cancer, recurrent UTI, cystocele, and high risk hematuria up-to-date with annual surveillance cystoscopy with Dr. Apolinar Junes who presents today for evaluation of possible UTI.  She is accompanied today by her daughter, who contributes to HPI.  Today she reports intermittent gross hematuria since surveillance cystoscopy in October.  This most recently recurred about 10 days ago and was light pink in color.  She tends to have a bit of vulvar burning as the gross hematuria clears.  They are concerned about excessive antibiotic use and frequent infections.  In-office UA today positive for trace leukocytes; urine microscopy with 6-10 WBCs/HPF, >10 epithelial cells/hpf, and moderate bacteria.  PMH: Past Medical History:  Diagnosis Date   Arthritis    Bladder cancer (HCC)    Bladder cancer (HCC)    Cancer (HCC)    multiple myeloma   Chronic kidney disease    Chronic kidney disease    Dizziness    GERD (gastroesophageal reflux disease)    Hypertension    Multiple myeloma (HCC)    Stroke (HCC)    tia x 2   Tubular adenoma of colon     Surgical History: Past Surgical History:  Procedure Laterality Date   bladder tumor removed     BREAST EXCISIONAL BIOPSY Bilateral 1970   Benign   BREAST SURGERY     bx   CATARACT EXTRACTION W/PHACO Left 12/16/2016   Procedure: CATARACT EXTRACTION PHACO AND INTRAOCULAR LENS PLACEMENT (IOC);  Surgeon: Nevada Crane, MD;  Location: [facility] ORS;  Service: Ophthalmology;  Laterality: Left;  [place] 00:38.0 AP% 14.6 CDE 5.54 Fluid pack lot # [id number]   CATARACT EXTRACTION W/PHACO Right 03/03/2017   Procedure: CATARACT EXTRACTION PHACO AND INTRAOCULAR LENS PLACEMENT (IOC);  Surgeon: Nevada Crane, MD;  Location: [facility] ORS;  Service: Ophthalmology;  Laterality: Right;  Lot #  [id number] H [place]: 00:29.8 AP%: 10.1 CDE: 3.01   COLONOSCOPY WITH PROPOFOL N/A 07/25/2015   Procedure: COLONOSCOPY WITH PROPOFOL;  Surgeon: Elnita Maxwell, MD;  Location: [facility] ENDOSCOPY;  Service: Endoscopy;  Laterality: N/A;   COLONOSCOPY WITH PROPOFOL N/A 06/29/2017   Procedure: COLONOSCOPY WITH PROPOFOL;  Surgeon: Toledo, Boykin Nearing, MD;  Location: [facility] ENDOSCOPY;  Service: Gastroenterology;  Laterality: N/A;   COLONOSCOPY WITH PROPOFOL N/A 07/14/2022   Procedure: COLONOSCOPY WITH PROPOFOL;  Surgeon: Toledo, Boykin Nearing, MD;  Location: [facility] ENDOSCOPY;  Service: Gastroenterology;  Laterality: N/A;   CORONARY ANGIOGRAPHY N/A 05/24/2019   Procedure: CORONARY ANGIOGRAPHY;  Surgeon: Laurier Nancy, MD;  Location: [facility] INVASIVE CV LAB;  Service: Cardiovascular;  Laterality: N/A;   ESOPHAGOGASTRODUODENOSCOPY (EGD) WITH PROPOFOL N/A 07/25/2015   Procedure: ESOPHAGOGASTRODUODENOSCOPY (EGD) WITH PROPOFOL;  Surgeon: Elnita Maxwell, MD;  Location: [facility] ENDOSCOPY;  Service: Endoscopy;  Laterality: N/A;   ESOPHAGOGASTRODUODENOSCOPY (EGD) WITH PROPOFOL N/A 06/29/2017   Procedure: ESOPHAGOGASTRODUODENOSCOPY (EGD) WITH PROPOFOL;  Surgeon: Toledo, Boykin Nearing, MD;  Location: [facility] ENDOSCOPY;  Service: Gastroenterology;  Laterality: N/A;   HERNIA REPAIR     LEFT HEART CATH N/A 05/24/2019   Procedure: Left Heart Cath;  Surgeon: Laurier Nancy, MD;  Location: [facility] INVASIVE CV LAB;  Service: Cardiovascular;  Laterality: N/A;    Home Medications:  Allergies as of 03/15/2023       Reactions   Iodine    Seizure Betadine ok Other reaction(s): Other (See  Comments) Sneezing Seizure Betadine ok   Sucralfate Rash   Ace Inhibitors Cough   Penicillins Other (See Comments), Rash   Has patient had a PCN reaction causing immediate rash, facial/tongue/throat swelling, SOB or lightheadedness with hypotension: Unknown Has patient had a PCN reaction causing severe rash involving mucus membranes or skin necrosis:  Unknown Has patient had a PCN reaction that required hospitalization: Unknown Has patient had a PCN reaction occurring within the last 10 years: Unknown If all of the above answers are "NO", then may proceed with Cephalosporin use. Other reaction(s): Other (See Comments) Has patient had a PCN reaction causing immediate rash, facial/tongue/throat swelling, SOB or lightheadedness with hypotension: Unknown Has patient had a PCN reaction causing severe rash involving mucus membranes or skin necrosis: Unknown Has patient had a PCN reaction that required hospitalization: Unknown Has patient had a PCN reaction occurring within the last 10 years: Unknown If all of the above answers are "NO", then may proceed with Cephalosporin use. Has patient had a PCN reaction causing immediate rash, facial/tongue/throat swelling, SOB or lightheadedness with hypotension: Unknown Has patient had a PCN reaction causing severe rash involving mucus membranes or skin necrosis: Unknown Has patient had a PCN reaction that required hospitalization: Unknown Has patient had a PCN reaction occurring within the last 10 years: Unknown If all of the above answers are "NO", then may proceed with Cephalosporin use.        Medication List        Accurate as of March 15, 2023  5:09 PM. If you have any questions, ask your nurse or doctor.          meclizine 25 MG tablet Commonly known as: ANTIVERT Take 25 mg by mouth 3 (three) times daily as needed.   metoprolol succinate 25 MG 24 hr tablet Commonly known as: TOPROL-XL Take 50 mg by mouth daily.   nitroGLYCERIN 0.3 MG SL tablet Commonly known as: NITROSTAT Place 1 tablet under the tongue as needed.   pantoprazole 40 MG tablet Commonly known as: PROTONIX Take by mouth.   Premarin vaginal cream Generic drug: conjugated estrogens Apply one pea-sized amount around the opening of the urethra daily for 2 weeks, then 3 times weekly moving forward. Started by:  Carman Ching   ranolazine 500 MG 12 hr tablet Commonly known as: RANEXA Take 500 mg by mouth 2 (two) times daily.   rOPINIRole 0.25 MG tablet Commonly known as: REQUIP Take 1 tablet by mouth as needed.   rosuvastatin 10 MG tablet Commonly known as: CRESTOR Take 10 mg by mouth daily.   Vitamin D3 10 MCG (400 UNIT) Caps        Allergies:  Allergies  Allergen Reactions   Iodine     Seizure  Betadine ok Other reaction(s): Other (See Comments) Sneezing Seizure  Betadine ok   Sucralfate Rash   Ace Inhibitors Cough   Penicillins Other (See Comments) and Rash    Has patient had a PCN reaction causing immediate rash, facial/tongue/throat swelling, SOB or lightheadedness with hypotension: Unknown Has patient had a PCN reaction causing severe rash involving mucus membranes or skin necrosis: Unknown Has patient had a PCN reaction that required hospitalization: Unknown Has patient had a PCN reaction occurring within the last 10 years: Unknown If all of the above answers are "NO", then may proceed with Cephalosporin use.  Other reaction(s): Other (See Comments) Has patient had a PCN reaction causing immediate rash, facial/tongue/throat swelling, SOB or lightheadedness with hypotension: Unknown Has patient had  a PCN reaction causing severe rash involving mucus membranes or skin necrosis: Unknown Has patient had a PCN reaction that required hospitalization: Unknown Has patient had a PCN reaction occurring within the last 10 years: Unknown If all of the above answers are "NO", then may proceed with Cephalosporin use. Has patient had a PCN reaction causing immediate rash, facial/tongue/throat swelling, SOB or lightheadedness with hypotension: Unknown Has patient had a PCN reaction causing severe rash involving mucus membranes or skin necrosis: Unknown Has patient had a PCN reaction that required hospitalization: Unknown Has patient had a PCN reaction occurring within the last  10 years: Unknown If all of the above answers are "NO", then may proceed with Cephalosporin use.    Family History: Family History  Problem Relation Age of Onset   Cancer Mother    Colon cancer Mother    Colon polyps Mother    Cancer Sister    Breast cancer Sister 19   Cancer Brother    Bladder Cancer Neg Hx    Kidney cancer Neg Hx     Social History:   reports that she has quit smoking. She has never used smokeless tobacco. She reports that she does not drink alcohol and does not use drugs.  Physical Exam: BP (!) 158/82   Pulse 81   Ht 5' (1.524 m)   Wt 162 lb (73.5 kg)   BMI 31.64 kg/m   Constitutional:  Alert and oriented, no acute distress, nontoxic appearing HEENT: [place], AT Cardiovascular: No clubbing, cyanosis, or edema Respiratory: Normal respiratory effort, no increased work of breathing Skin: No rashes, bruises or suspicious lesions Neurologic: Grossly intact, no focal deficits, moving all 4 extremities Psychiatric: Normal mood and affect  Laboratory Data: Results for orders placed or performed in visit on 03/15/23  Microscopic Examination   Urine  Result Value Ref Range   WBC, UA 6-10 (A) 0 - 5 /hpf   RBC, Urine 0-2 0 - 2 /hpf   Epithelial Cells (non renal) >10 (A) 0 - 10 /hpf   Mucus, UA Present (A) Not Estab.   Bacteria, UA Moderate (A) None seen/Few  Urinalysis, Complete  Result Value Ref Range   Specific Gravity, UA 1.015 1.005 - 1.030   pH, UA 5.5 5.0 - 7.5   Color, UA Yellow Yellow   Appearance Ur Clear Clear   Leukocytes,UA Trace (A) Negative   Protein,UA Negative Negative/Trace   Glucose, UA Negative Negative   Ketones, UA Negative Negative   RBC, UA Negative Negative   Bilirubin, UA Negative Negative   Urobilinogen, Ur 0.2 0.2 - 1.0 mg/dL   Nitrite, UA Negative Negative   Microscopic Examination See below:    Assessment & Plan:   1. Gross hematuria No hematuria noted on urine microscopy today.  Her UA appears contaminated, low suspicion  for acute cystitis at this time.  Will send for culture and recheck her symptoms when available.  Will also send urine for cytology, though low suspicion this will be positive since she is up-to-date with surveillance cystoscopy. - Urinalysis, Complete - CULTURE, URINE COMPREHENSIVE  2. Vulvar burning She clarifies that her previously reported dysuria is actually vulvar burning consistent with GSM.  She was previously on estrogen cream, but ran out of this and did not realize she needed to be on it lifelong.  We had a lengthy conversation about the role of lost estrogen during menopause and development of GSM and the role of topical vaginal estrogen cream and recurrent UTI prevention and  relieving vulvar burning.  I am refilling her Premarin today and encouraged her to use this indefinitely.  She expressed understanding. - conjugated estrogens (PREMARIN) vaginal cream; Apply one pea-sized amount around the opening of the urethra daily for 2 weeks, then 3 times weekly moving forward.  Dispense: 30 g; Refill: 4   Return for Will call with results.  Carman Ching, PA-C  [facility]  [address] [place], [place] [address] [phone]

## 2023-03-16 LAB — URINALYSIS, COMPLETE
Bilirubin, UA: NEGATIVE
Glucose, UA: NEGATIVE
Ketones, UA: NEGATIVE
Nitrite, UA: NEGATIVE
Protein,UA: NEGATIVE
RBC, UA: NEGATIVE
Specific Gravity, UA: 1.015 (ref 1.005–1.030)
Urobilinogen, Ur: 0.2 mg/dL (ref 0.2–1.0)
pH, UA: 5.5 (ref 5.0–7.5)

## 2023-03-16 LAB — MICROSCOPIC EXAMINATION: Epithelial Cells (non renal): >10 /[HPF] — AB (ref 0–10)

## 2023-03-18 LAB — CULTURE, URINE COMPREHENSIVE

## 2023-03-22 ENCOUNTER — Telehealth: Payer: Self-pay | Admitting: Physician Assistant

## 2023-03-22 NOTE — Telephone Encounter (Signed)
Great news, her urine cytology was negative for cancer cells.  Please continue using estrogen cream as discussed in clinic.

## 2023-03-23 NOTE — Telephone Encounter (Signed)
Pr informed, voiced understanding.

## 2023-04-18 DIAGNOSIS — Z961 Presence of intraocular lens: Secondary | ICD-10-CM | POA: Diagnosis not present

## 2023-04-18 DIAGNOSIS — H1013 Acute atopic conjunctivitis, bilateral: Secondary | ICD-10-CM | POA: Diagnosis not present

## 2023-04-18 DIAGNOSIS — H26493 Other secondary cataract, bilateral: Secondary | ICD-10-CM | POA: Diagnosis not present

## 2023-05-25 DIAGNOSIS — I2510 Atherosclerotic heart disease of native coronary artery without angina pectoris: Secondary | ICD-10-CM | POA: Diagnosis not present

## 2023-05-31 DIAGNOSIS — M545 Low back pain, unspecified: Secondary | ICD-10-CM | POA: Diagnosis not present

## 2023-05-31 DIAGNOSIS — Z780 Asymptomatic menopausal state: Secondary | ICD-10-CM | POA: Diagnosis not present

## 2023-05-31 DIAGNOSIS — I129 Hypertensive chronic kidney disease with stage 1 through stage 4 chronic kidney disease, or unspecified chronic kidney disease: Secondary | ICD-10-CM | POA: Diagnosis not present

## 2023-05-31 DIAGNOSIS — L299 Pruritus, unspecified: Secondary | ICD-10-CM | POA: Diagnosis not present

## 2023-05-31 DIAGNOSIS — I2510 Atherosclerotic heart disease of native coronary artery without angina pectoris: Secondary | ICD-10-CM | POA: Diagnosis not present

## 2023-05-31 DIAGNOSIS — M5432 Sciatica, left side: Secondary | ICD-10-CM | POA: Diagnosis not present

## 2023-05-31 DIAGNOSIS — N184 Chronic kidney disease, stage 4 (severe): Secondary | ICD-10-CM | POA: Diagnosis not present

## 2023-05-31 DIAGNOSIS — F5101 Primary insomnia: Secondary | ICD-10-CM | POA: Diagnosis not present

## 2023-06-07 DIAGNOSIS — M47816 Spondylosis without myelopathy or radiculopathy, lumbar region: Secondary | ICD-10-CM | POA: Diagnosis not present

## 2023-06-07 DIAGNOSIS — G8929 Other chronic pain: Secondary | ICD-10-CM | POA: Diagnosis not present

## 2023-06-07 DIAGNOSIS — M5442 Lumbago with sciatica, left side: Secondary | ICD-10-CM | POA: Diagnosis not present

## 2023-06-07 DIAGNOSIS — M51362 Other intervertebral disc degeneration, lumbar region with discogenic back pain and lower extremity pain: Secondary | ICD-10-CM | POA: Diagnosis not present

## 2023-06-07 DIAGNOSIS — M4316 Spondylolisthesis, lumbar region: Secondary | ICD-10-CM | POA: Diagnosis not present

## 2023-06-07 DIAGNOSIS — M810 Age-related osteoporosis without current pathological fracture: Secondary | ICD-10-CM | POA: Diagnosis not present

## 2023-06-09 DIAGNOSIS — M810 Age-related osteoporosis without current pathological fracture: Secondary | ICD-10-CM | POA: Insufficient documentation

## 2023-06-15 ENCOUNTER — Other Ambulatory Visit: Payer: Self-pay | Admitting: *Deleted

## 2023-06-15 DIAGNOSIS — D472 Monoclonal gammopathy: Secondary | ICD-10-CM

## 2023-06-16 ENCOUNTER — Inpatient Hospital Stay: Payer: 59 | Attending: Oncology

## 2023-06-23 ENCOUNTER — Inpatient Hospital Stay: Payer: 59 | Admitting: Oncology

## 2023-06-28 ENCOUNTER — Emergency Department (HOSPITAL_COMMUNITY)
Admission: EM | Admit: 2023-06-28 | Discharge: 2023-06-28 | Disposition: A | Discharge status: Home / Self Care | Attending: Emergency Medicine | Admitting: Emergency Medicine

## 2023-06-28 ENCOUNTER — Emergency Department (HOSPITAL_COMMUNITY)

## 2023-06-28 DIAGNOSIS — N189 Chronic kidney disease, unspecified: Secondary | ICD-10-CM | POA: Insufficient documentation

## 2023-06-28 DIAGNOSIS — M47814 Spondylosis without myelopathy or radiculopathy, thoracic region: Secondary | ICD-10-CM | POA: Diagnosis not present

## 2023-06-28 DIAGNOSIS — J9811 Atelectasis: Secondary | ICD-10-CM | POA: Diagnosis not present

## 2023-06-28 DIAGNOSIS — S79911A Unspecified injury of right hip, initial encounter: Secondary | ICD-10-CM | POA: Diagnosis not present

## 2023-06-28 DIAGNOSIS — J329 Chronic sinusitis, unspecified: Secondary | ICD-10-CM | POA: Diagnosis not present

## 2023-06-28 DIAGNOSIS — M25551 Pain in right hip: Secondary | ICD-10-CM

## 2023-06-28 DIAGNOSIS — M546 Pain in thoracic spine: Secondary | ICD-10-CM | POA: Diagnosis not present

## 2023-06-28 DIAGNOSIS — S0083XA Contusion of other part of head, initial encounter: Secondary | ICD-10-CM | POA: Diagnosis not present

## 2023-06-28 DIAGNOSIS — R0689 Other abnormalities of breathing: Secondary | ICD-10-CM | POA: Diagnosis not present

## 2023-06-28 DIAGNOSIS — M545 Low back pain, unspecified: Secondary | ICD-10-CM | POA: Diagnosis not present

## 2023-06-28 DIAGNOSIS — S0990XA Unspecified injury of head, initial encounter: Secondary | ICD-10-CM | POA: Diagnosis not present

## 2023-06-28 DIAGNOSIS — S299XXA Unspecified injury of thorax, initial encounter: Secondary | ICD-10-CM | POA: Insufficient documentation

## 2023-06-28 DIAGNOSIS — S3993XA Unspecified injury of pelvis, initial encounter: Secondary | ICD-10-CM | POA: Diagnosis not present

## 2023-06-28 DIAGNOSIS — Z79899 Other long term (current) drug therapy: Secondary | ICD-10-CM | POA: Diagnosis not present

## 2023-06-28 DIAGNOSIS — R110 Nausea: Secondary | ICD-10-CM | POA: Diagnosis not present

## 2023-06-28 DIAGNOSIS — M549 Dorsalgia, unspecified: Secondary | ICD-10-CM | POA: Diagnosis not present

## 2023-06-28 DIAGNOSIS — S3991XA Unspecified injury of abdomen, initial encounter: Secondary | ICD-10-CM | POA: Diagnosis not present

## 2023-06-28 DIAGNOSIS — I10 Essential (primary) hypertension: Secondary | ICD-10-CM | POA: Diagnosis not present

## 2023-06-28 DIAGNOSIS — S199XXA Unspecified injury of neck, initial encounter: Secondary | ICD-10-CM | POA: Diagnosis not present

## 2023-06-28 DIAGNOSIS — Y92410 Unspecified street and highway as the place of occurrence of the external cause: Secondary | ICD-10-CM | POA: Diagnosis not present

## 2023-06-28 LAB — CBC
HCT: 36.2 % (ref 36.0–46.0)
Hemoglobin: 11.7 g/dL — ABNORMAL LOW (ref 12.0–15.0)
MCH: 30.9 pg (ref 26.0–34.0)
MCHC: 32.3 g/dL (ref 30.0–36.0)
MCV: 95.5 fL (ref 80.0–100.0)
Platelets: 149 10*3/uL — ABNORMAL LOW (ref 150–400)
RBC: 3.79 MIL/uL — ABNORMAL LOW (ref 3.87–5.11)
RDW: 13.4 % (ref 11.5–15.5)
WBC: 5.7 10*3/uL (ref 4.0–10.5)
nRBC: 0 % (ref 0.0–0.2)

## 2023-06-28 LAB — COMPREHENSIVE METABOLIC PANEL
ALT: 23 U/L (ref 0–44)
AST: 40 U/L (ref 15–41)
Albumin: 3.1 g/dL — ABNORMAL LOW (ref 3.5–5.0)
Alkaline Phosphatase: 142 U/L — ABNORMAL HIGH (ref 38–126)
Anion gap: 8 (ref 5–15)
BUN: 30 mg/dL — ABNORMAL HIGH (ref 8–23)
CO2: 19 mmol/L — ABNORMAL LOW (ref 22–32)
Calcium: 8.7 mg/dL — ABNORMAL LOW (ref 8.9–10.3)
Chloride: 108 mmol/L (ref 98–111)
Creatinine, Ser: 1.83 mg/dL — ABNORMAL HIGH (ref 0.44–1.00)
GFR, Estimated: 27 mL/min — ABNORMAL LOW (ref >60)
Glucose, Bld: 127 mg/dL — ABNORMAL HIGH (ref 70–99)
Potassium: 3.9 mmol/L (ref 3.5–5.1)
Sodium: 135 mmol/L (ref 135–145)
Total Bilirubin: 0.6 mg/dL (ref 0.0–1.2)
Total Protein: 8.6 g/dL — ABNORMAL HIGH (ref 6.5–8.1)

## 2023-06-28 LAB — I-STAT CHEM 8, ED
BUN: 30 mg/dL — ABNORMAL HIGH (ref 8–23)
Calcium, Ion: 1.13 mmol/L — ABNORMAL LOW (ref 1.15–1.40)
Chloride: 109 mmol/L (ref 98–111)
Creatinine, Ser: 2 mg/dL — ABNORMAL HIGH (ref 0.44–1.00)
Glucose, Bld: 130 mg/dL — ABNORMAL HIGH (ref 70–99)
HCT: 34 % — ABNORMAL LOW (ref 36.0–46.0)
Hemoglobin: 11.6 g/dL — ABNORMAL LOW (ref 12.0–15.0)
Potassium: 3.9 mmol/L (ref 3.5–5.1)
Sodium: 139 mmol/L (ref 135–145)
TCO2: 20 mmol/L — ABNORMAL LOW (ref 22–32)

## 2023-06-28 LAB — ETHANOL: Alcohol, Ethyl (B): <10 mg/dL (ref <10)

## 2023-06-28 LAB — I-STAT CG4 LACTIC ACID, ED: Lactic Acid, Venous: 1.3 mmol/L (ref 0.5–1.9)

## 2023-06-28 LAB — SAMPLE TO BLOOD BANK

## 2023-06-28 MED ORDER — SODIUM CHLORIDE 0.9 % IV BOLUS
500.0000 mL | Freq: Once | INTRAVENOUS | Status: AC
  Administered 2023-06-28: 500 mL via INTRAVENOUS

## 2023-06-28 MED ORDER — OXYCODONE-ACETAMINOPHEN 5-325 MG PO TABS
1.0000 | ORAL_TABLET | Freq: Once | ORAL | Status: AC
  Administered 2023-06-28: 1 via ORAL
  Filled 2023-06-28: qty 1

## 2023-06-28 MED ORDER — ONDANSETRON HCL 4 MG/2ML IJ SOLN
4.0000 mg | Freq: Once | INTRAMUSCULAR | Status: AC
  Administered 2023-06-28: 4 mg via INTRAVENOUS
  Filled 2023-06-28: qty 2

## 2023-06-28 MED ORDER — OXYCODONE-ACETAMINOPHEN 5-325 MG PO TABS: 1.0000 | ORAL_TABLET | Freq: Four times a day (QID) | ORAL | 0 refills | Status: DC | PRN
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
## 2023-07-07 ENCOUNTER — Ambulatory Visit
Admission: RE | Admit: 2023-07-07 | Discharge: 2023-07-07 | Disposition: A | Discharge status: Home / Self Care | Source: Ambulatory Visit | Attending: Physician Assistant | Admitting: Physician Assistant

## 2023-07-07 ENCOUNTER — Other Ambulatory Visit: Payer: Self-pay | Admitting: Physician Assistant

## 2023-07-07 DIAGNOSIS — M7989 Other specified soft tissue disorders: Secondary | ICD-10-CM

## 2023-07-07 DIAGNOSIS — M545 Low back pain, unspecified: Secondary | ICD-10-CM | POA: Diagnosis not present

## 2023-07-07 DIAGNOSIS — I82442 Acute embolism and thrombosis of left tibial vein: Secondary | ICD-10-CM | POA: Diagnosis not present

## 2023-07-11 DIAGNOSIS — M79605 Pain in left leg: Secondary | ICD-10-CM | POA: Diagnosis not present

## 2023-07-11 DIAGNOSIS — M5459 Other low back pain: Secondary | ICD-10-CM | POA: Diagnosis not present

## 2023-07-15 DIAGNOSIS — M5459 Other low back pain: Secondary | ICD-10-CM | POA: Diagnosis not present

## 2023-07-15 DIAGNOSIS — M79605 Pain in left leg: Secondary | ICD-10-CM | POA: Diagnosis not present

## 2023-07-18 DIAGNOSIS — R079 Chest pain, unspecified: Secondary | ICD-10-CM | POA: Diagnosis not present

## 2023-07-18 DIAGNOSIS — C9000 Multiple myeloma not having achieved remission: Secondary | ICD-10-CM | POA: Diagnosis not present

## 2023-07-18 DIAGNOSIS — N184 Chronic kidney disease, stage 4 (severe): Secondary | ICD-10-CM | POA: Diagnosis not present

## 2023-07-18 DIAGNOSIS — E785 Hyperlipidemia, unspecified: Secondary | ICD-10-CM | POA: Diagnosis not present

## 2023-07-18 DIAGNOSIS — I824Z2 Acute embolism and thrombosis of unspecified deep veins of left distal lower extremity: Secondary | ICD-10-CM | POA: Diagnosis not present

## 2023-07-18 DIAGNOSIS — I2089 Other forms of angina pectoris: Secondary | ICD-10-CM | POA: Diagnosis not present

## 2023-07-18 DIAGNOSIS — I10 Essential (primary) hypertension: Secondary | ICD-10-CM | POA: Diagnosis not present

## 2023-07-18 DIAGNOSIS — K219 Gastro-esophageal reflux disease without esophagitis: Secondary | ICD-10-CM | POA: Diagnosis not present

## 2023-07-18 DIAGNOSIS — I2510 Atherosclerotic heart disease of native coronary artery without angina pectoris: Secondary | ICD-10-CM | POA: Diagnosis not present

## 2023-07-19 DIAGNOSIS — M79605 Pain in left leg: Secondary | ICD-10-CM | POA: Diagnosis not present

## 2023-07-19 DIAGNOSIS — M5459 Other low back pain: Secondary | ICD-10-CM | POA: Diagnosis not present

## 2023-07-26 DIAGNOSIS — I82442 Acute embolism and thrombosis of left tibial vein: Secondary | ICD-10-CM | POA: Diagnosis not present

## 2023-07-26 DIAGNOSIS — D631 Anemia in chronic kidney disease: Secondary | ICD-10-CM | POA: Diagnosis not present

## 2023-07-26 DIAGNOSIS — S39012D Strain of muscle, fascia and tendon of lower back, subsequent encounter: Secondary | ICD-10-CM | POA: Diagnosis not present

## 2023-07-26 DIAGNOSIS — I129 Hypertensive chronic kidney disease with stage 1 through stage 4 chronic kidney disease, or unspecified chronic kidney disease: Secondary | ICD-10-CM | POA: Diagnosis not present

## 2023-07-26 DIAGNOSIS — I2510 Atherosclerotic heart disease of native coronary artery without angina pectoris: Secondary | ICD-10-CM | POA: Diagnosis not present

## 2023-07-26 DIAGNOSIS — N184 Chronic kidney disease, stage 4 (severe): Secondary | ICD-10-CM | POA: Diagnosis not present

## 2023-07-28 DIAGNOSIS — M5459 Other low back pain: Secondary | ICD-10-CM | POA: Diagnosis not present

## 2023-07-28 DIAGNOSIS — M79605 Pain in left leg: Secondary | ICD-10-CM | POA: Diagnosis not present

## 2023-08-01 ENCOUNTER — Other Ambulatory Visit
Admission: RE | Admit: 2023-08-01 | Discharge: 2023-08-01 | Disposition: A | Discharge status: Home / Self Care | Source: Ambulatory Visit | Attending: Nephrology | Admitting: Nephrology

## 2023-08-01 DIAGNOSIS — D631 Anemia in chronic kidney disease: Secondary | ICD-10-CM | POA: Insufficient documentation

## 2023-08-01 DIAGNOSIS — D472 Monoclonal gammopathy: Secondary | ICD-10-CM | POA: Diagnosis not present

## 2023-08-01 DIAGNOSIS — N2581 Secondary hyperparathyroidism of renal origin: Secondary | ICD-10-CM | POA: Insufficient documentation

## 2023-08-01 DIAGNOSIS — E875 Hyperkalemia: Secondary | ICD-10-CM | POA: Diagnosis not present

## 2023-08-01 DIAGNOSIS — I129 Hypertensive chronic kidney disease with stage 1 through stage 4 chronic kidney disease, or unspecified chronic kidney disease: Secondary | ICD-10-CM | POA: Diagnosis not present

## 2023-08-01 DIAGNOSIS — N184 Chronic kidney disease, stage 4 (severe): Secondary | ICD-10-CM | POA: Insufficient documentation

## 2023-08-01 LAB — RENAL FUNCTION PANEL
Albumin: 3.3 g/dL — ABNORMAL LOW (ref 3.5–5.0)
Anion gap: 5 (ref 5–15)
BUN: 40 mg/dL — ABNORMAL HIGH (ref 8–23)
CO2: 22 mmol/L (ref 22–32)
Calcium: 8.3 mg/dL — ABNORMAL LOW (ref 8.9–10.3)
Chloride: 104 mmol/L (ref 98–111)
Creatinine, Ser: 1.64 mg/dL — ABNORMAL HIGH (ref 0.44–1.00)
GFR, Estimated: 30 mL/min — ABNORMAL LOW (ref >60)
Glucose, Bld: 81 mg/dL (ref 70–99)
Phosphorus: 4.3 mg/dL (ref 2.5–4.6)
Potassium: 5.1 mmol/L (ref 3.5–5.1)
Sodium: 131 mmol/L — ABNORMAL LOW (ref 135–145)

## 2023-08-01 LAB — CBC WITH DIFFERENTIAL/PLATELET
Abs Immature Granulocytes: 0.02 10*3/uL (ref 0.00–0.07)
Basophils Absolute: 0 10*3/uL (ref 0.0–0.1)
Basophils Relative: 1 %
Eosinophils Absolute: 0.1 10*3/uL (ref 0.0–0.5)
Eosinophils Relative: 1 %
HCT: 32.6 % — ABNORMAL LOW (ref 36.0–46.0)
Hemoglobin: 10.9 g/dL — ABNORMAL LOW (ref 12.0–15.0)
Immature Granulocytes: 0 %
Lymphocytes Relative: 44 %
Lymphs Abs: 2 10*3/uL (ref 0.7–4.0)
MCH: 31.8 pg (ref 26.0–34.0)
MCHC: 33.4 g/dL (ref 30.0–36.0)
MCV: 95 fL (ref 80.0–100.0)
Monocytes Absolute: 0.4 10*3/uL (ref 0.1–1.0)
Monocytes Relative: 9 %
Neutro Abs: 2.1 10*3/uL (ref 1.7–7.7)
Neutrophils Relative %: 45 %
Platelets: 155 10*3/uL (ref 150–400)
RBC: 3.43 MIL/uL — ABNORMAL LOW (ref 3.87–5.11)
RDW: 14 % (ref 11.5–15.5)
WBC: 4.5 10*3/uL (ref 4.0–10.5)
nRBC: 0 % (ref 0.0–0.2)

## 2023-08-01 LAB — URINALYSIS, ROUTINE W REFLEX MICROSCOPIC
Bilirubin Urine: NEGATIVE
Glucose, UA: NEGATIVE mg/dL
Hgb urine dipstick: NEGATIVE
Ketones, ur: NEGATIVE mg/dL
Leukocytes,Ua: NEGATIVE
Nitrite: NEGATIVE
Protein, ur: NEGATIVE mg/dL
Specific Gravity, Urine: 1.01 (ref 1.005–1.030)
pH: 5 (ref 5.0–8.0)

## 2023-08-01 LAB — PROTEIN / CREATININE RATIO, URINE
Creatinine, Urine: 94 mg/dL
Total Protein, Urine: <6 mg/dL

## 2023-08-01 LAB — MAGNESIUM: Magnesium: 2.2 mg/dL (ref 1.7–2.4)

## 2023-08-01 LAB — URIC ACID: Uric Acid, Serum: 8.5 mg/dL — ABNORMAL HIGH (ref 2.5–7.1)

## 2023-08-02 LAB — PARATHYROID HORMONE, INTACT (NO CA): PTH: 98 pg/mL — ABNORMAL HIGH (ref 15–65)

## 2023-08-04 DIAGNOSIS — M5442 Lumbago with sciatica, left side: Secondary | ICD-10-CM | POA: Diagnosis not present

## 2023-08-04 DIAGNOSIS — M51362 Other intervertebral disc degeneration, lumbar region with discogenic back pain and lower extremity pain: Secondary | ICD-10-CM | POA: Diagnosis not present

## 2023-08-04 DIAGNOSIS — M4316 Spondylolisthesis, lumbar region: Secondary | ICD-10-CM | POA: Diagnosis not present

## 2023-08-04 DIAGNOSIS — M25572 Pain in left ankle and joints of left foot: Secondary | ICD-10-CM | POA: Diagnosis not present

## 2023-08-04 DIAGNOSIS — M47816 Spondylosis without myelopathy or radiculopathy, lumbar region: Secondary | ICD-10-CM | POA: Diagnosis not present

## 2023-08-04 DIAGNOSIS — M25472 Effusion, left ankle: Secondary | ICD-10-CM | POA: Diagnosis not present

## 2023-08-05 ENCOUNTER — Other Ambulatory Visit: Payer: Self-pay | Admitting: Sports Medicine

## 2023-08-05 DIAGNOSIS — M5442 Lumbago with sciatica, left side: Secondary | ICD-10-CM

## 2023-08-05 DIAGNOSIS — M5459 Other low back pain: Secondary | ICD-10-CM | POA: Diagnosis not present

## 2023-08-05 DIAGNOSIS — M79605 Pain in left leg: Secondary | ICD-10-CM | POA: Diagnosis not present

## 2023-08-08 DIAGNOSIS — I10 Essential (primary) hypertension: Secondary | ICD-10-CM | POA: Diagnosis not present

## 2023-08-08 DIAGNOSIS — D631 Anemia in chronic kidney disease: Secondary | ICD-10-CM | POA: Diagnosis not present

## 2023-08-08 DIAGNOSIS — N1832 Chronic kidney disease, stage 3b: Secondary | ICD-10-CM | POA: Diagnosis not present

## 2023-08-08 DIAGNOSIS — I129 Hypertensive chronic kidney disease with stage 1 through stage 4 chronic kidney disease, or unspecified chronic kidney disease: Secondary | ICD-10-CM | POA: Diagnosis not present

## 2023-08-08 DIAGNOSIS — D472 Monoclonal gammopathy: Secondary | ICD-10-CM | POA: Diagnosis not present

## 2023-08-08 DIAGNOSIS — E871 Hypo-osmolality and hyponatremia: Secondary | ICD-10-CM | POA: Diagnosis not present

## 2023-08-11 DIAGNOSIS — M5459 Other low back pain: Secondary | ICD-10-CM | POA: Diagnosis not present

## 2023-08-11 DIAGNOSIS — M79605 Pain in left leg: Secondary | ICD-10-CM | POA: Diagnosis not present

## 2023-08-12 ENCOUNTER — Encounter (INDEPENDENT_AMBULATORY_CARE_PROVIDER_SITE_OTHER): Admitting: Vascular Surgery

## 2023-08-17 ENCOUNTER — Ambulatory Visit
Admission: RE | Admit: 2023-08-17 | Discharge: 2023-08-17 | Discharge status: Home / Self Care | Source: Ambulatory Visit | Attending: Sports Medicine

## 2023-08-17 ENCOUNTER — Ambulatory Visit
Admission: RE | Admit: 2023-08-17 | Discharge: 2023-08-17 | Disposition: A | Discharge status: Home / Self Care | Source: Ambulatory Visit | Attending: Sports Medicine | Admitting: Sports Medicine

## 2023-08-17 DIAGNOSIS — M5442 Lumbago with sciatica, left side: Secondary | ICD-10-CM | POA: Diagnosis not present

## 2023-08-17 DIAGNOSIS — R109 Unspecified abdominal pain: Secondary | ICD-10-CM | POA: Diagnosis not present

## 2023-08-17 DIAGNOSIS — M48061 Spinal stenosis, lumbar region without neurogenic claudication: Secondary | ICD-10-CM | POA: Diagnosis not present

## 2023-08-17 DIAGNOSIS — E861 Hypovolemia: Secondary | ICD-10-CM | POA: Diagnosis not present

## 2023-08-17 DIAGNOSIS — M948X9 Other specified disorders of cartilage, unspecified sites: Secondary | ICD-10-CM | POA: Diagnosis not present

## 2023-08-17 DIAGNOSIS — Z87898 Personal history of other specified conditions: Secondary | ICD-10-CM | POA: Diagnosis not present

## 2023-08-17 DIAGNOSIS — S3993XA Unspecified injury of pelvis, initial encounter: Secondary | ICD-10-CM | POA: Diagnosis not present

## 2023-08-17 DIAGNOSIS — E538 Deficiency of other specified B group vitamins: Secondary | ICD-10-CM | POA: Diagnosis not present

## 2023-08-17 DIAGNOSIS — S3992XA Unspecified injury of lower back, initial encounter: Secondary | ICD-10-CM | POA: Diagnosis not present

## 2023-08-17 DIAGNOSIS — R233 Spontaneous ecchymoses: Secondary | ICD-10-CM | POA: Diagnosis not present

## 2023-08-17 DIAGNOSIS — M1990 Unspecified osteoarthritis, unspecified site: Secondary | ICD-10-CM | POA: Diagnosis not present

## 2023-08-17 DIAGNOSIS — Y929 Unspecified place or not applicable: Secondary | ICD-10-CM | POA: Insufficient documentation

## 2023-08-17 DIAGNOSIS — M4316 Spondylolisthesis, lumbar region: Secondary | ICD-10-CM | POA: Diagnosis not present

## 2023-08-17 DIAGNOSIS — R5383 Other fatigue: Secondary | ICD-10-CM | POA: Diagnosis not present

## 2023-08-17 DIAGNOSIS — Y939 Activity, unspecified: Secondary | ICD-10-CM | POA: Diagnosis not present

## 2023-08-17 DIAGNOSIS — M5136 Other intervertebral disc degeneration, lumbar region with discogenic back pain only: Secondary | ICD-10-CM | POA: Diagnosis not present

## 2023-08-18 ENCOUNTER — Observation Stay
Admission: EM | Admit: 2023-08-18 | Discharge: 2023-08-21 | Disposition: A | Discharge status: Other Acute Inpatient Hospital | Attending: Internal Medicine | Admitting: Internal Medicine

## 2023-08-18 ENCOUNTER — Encounter: Payer: Self-pay | Admitting: Emergency Medicine

## 2023-08-18 ENCOUNTER — Other Ambulatory Visit: Payer: Self-pay

## 2023-08-18 DIAGNOSIS — N1832 Chronic kidney disease, stage 3b: Secondary | ICD-10-CM | POA: Diagnosis present

## 2023-08-18 DIAGNOSIS — C9000 Multiple myeloma not having achieved remission: Secondary | ICD-10-CM | POA: Diagnosis not present

## 2023-08-18 DIAGNOSIS — D62 Acute posthemorrhagic anemia: Principal | ICD-10-CM | POA: Insufficient documentation

## 2023-08-18 DIAGNOSIS — K922 Gastrointestinal hemorrhage, unspecified: Secondary | ICD-10-CM | POA: Insufficient documentation

## 2023-08-18 DIAGNOSIS — E785 Hyperlipidemia, unspecified: Secondary | ICD-10-CM | POA: Insufficient documentation

## 2023-08-18 DIAGNOSIS — E872 Acidosis, unspecified: Secondary | ICD-10-CM | POA: Diagnosis not present

## 2023-08-18 DIAGNOSIS — Z8551 Personal history of malignant neoplasm of bladder: Secondary | ICD-10-CM | POA: Diagnosis not present

## 2023-08-18 DIAGNOSIS — G2581 Restless legs syndrome: Secondary | ICD-10-CM | POA: Diagnosis not present

## 2023-08-18 DIAGNOSIS — D509 Iron deficiency anemia, unspecified: Secondary | ICD-10-CM | POA: Diagnosis not present

## 2023-08-18 DIAGNOSIS — K219 Gastro-esophageal reflux disease without esophagitis: Secondary | ICD-10-CM | POA: Diagnosis not present

## 2023-08-18 DIAGNOSIS — E871 Hypo-osmolality and hyponatremia: Secondary | ICD-10-CM | POA: Insufficient documentation

## 2023-08-18 DIAGNOSIS — D649 Anemia, unspecified: Principal | ICD-10-CM | POA: Diagnosis present

## 2023-08-18 DIAGNOSIS — I2510 Atherosclerotic heart disease of native coronary artery without angina pectoris: Secondary | ICD-10-CM | POA: Diagnosis present

## 2023-08-18 DIAGNOSIS — Z87891 Personal history of nicotine dependence: Secondary | ICD-10-CM | POA: Insufficient documentation

## 2023-08-18 DIAGNOSIS — D519 Vitamin B12 deficiency anemia, unspecified: Secondary | ICD-10-CM | POA: Insufficient documentation

## 2023-08-18 DIAGNOSIS — I129 Hypertensive chronic kidney disease with stage 1 through stage 4 chronic kidney disease, or unspecified chronic kidney disease: Secondary | ICD-10-CM | POA: Diagnosis not present

## 2023-08-18 DIAGNOSIS — K5730 Diverticulosis of large intestine without perforation or abscess without bleeding: Secondary | ICD-10-CM | POA: Diagnosis not present

## 2023-08-18 DIAGNOSIS — Z8673 Personal history of transient ischemic attack (TIA), and cerebral infarction without residual deficits: Secondary | ICD-10-CM | POA: Insufficient documentation

## 2023-08-18 DIAGNOSIS — Z7982 Long term (current) use of aspirin: Secondary | ICD-10-CM | POA: Insufficient documentation

## 2023-08-18 DIAGNOSIS — Z86718 Personal history of other venous thrombosis and embolism: Secondary | ICD-10-CM | POA: Diagnosis not present

## 2023-08-18 DIAGNOSIS — Z79899 Other long term (current) drug therapy: Secondary | ICD-10-CM | POA: Insufficient documentation

## 2023-08-18 DIAGNOSIS — Z7901 Long term (current) use of anticoagulants: Secondary | ICD-10-CM | POA: Insufficient documentation

## 2023-08-18 DIAGNOSIS — K921 Melena: Secondary | ICD-10-CM | POA: Insufficient documentation

## 2023-08-18 DIAGNOSIS — K449 Diaphragmatic hernia without obstruction or gangrene: Secondary | ICD-10-CM | POA: Diagnosis not present

## 2023-08-18 DIAGNOSIS — R531 Weakness: Secondary | ICD-10-CM | POA: Diagnosis present

## 2023-08-18 DIAGNOSIS — D472 Monoclonal gammopathy: Secondary | ICD-10-CM | POA: Diagnosis present

## 2023-08-18 DIAGNOSIS — I10 Essential (primary) hypertension: Secondary | ICD-10-CM | POA: Diagnosis present

## 2023-08-18 LAB — ABO/RH: ABO/RH(D): AB POS

## 2023-08-18 LAB — COMPREHENSIVE METABOLIC PANEL WITH GFR
ALT: 13 U/L (ref 0–44)
AST: 26 U/L (ref 15–41)
Albumin: 3.1 g/dL — ABNORMAL LOW (ref 3.5–5.0)
Alkaline Phosphatase: 117 U/L (ref 38–126)
Anion gap: 3 — ABNORMAL LOW (ref 5–15)
BUN: 36 mg/dL — ABNORMAL HIGH (ref 8–23)
CO2: 25 mmol/L (ref 22–32)
Calcium: 7.6 mg/dL — ABNORMAL LOW (ref 8.9–10.3)
Chloride: 105 mmol/L (ref 98–111)
Creatinine, Ser: 1.48 mg/dL — ABNORMAL HIGH (ref 0.44–1.00)
GFR, Estimated: 34 mL/min — ABNORMAL LOW (ref >60)
Glucose, Bld: 119 mg/dL — ABNORMAL HIGH (ref 70–99)
Potassium: 4.3 mmol/L (ref 3.5–5.1)
Sodium: 133 mmol/L — ABNORMAL LOW (ref 135–145)
Total Bilirubin: 0.5 mg/dL (ref 0.0–1.2)
Total Protein: 7.8 g/dL (ref 6.5–8.1)

## 2023-08-18 LAB — CBC
HCT: 18.6 % — ABNORMAL LOW (ref 36.0–46.0)
Hemoglobin: 6.1 g/dL — ABNORMAL LOW (ref 12.0–15.0)
MCH: 31.8 pg (ref 26.0–34.0)
MCHC: 32.8 g/dL (ref 30.0–36.0)
MCV: 96.9 fL (ref 80.0–100.0)
Platelets: 219 10*3/uL (ref 150–400)
RBC: 1.92 MIL/uL — ABNORMAL LOW (ref 3.87–5.11)
RDW: 14.5 % (ref 11.5–15.5)
WBC: 4.3 10*3/uL (ref 4.0–10.5)
nRBC: 0 % (ref 0.0–0.2)

## 2023-08-18 LAB — RETICULOCYTES
Immature Retic Fract: 34.7 % — ABNORMAL HIGH (ref 2.3–15.9)
RBC.: 1.92 MIL/uL — ABNORMAL LOW (ref 3.87–5.11)
Retic Count, Absolute: 96.2 10*3/uL (ref 19.0–186.0)
Retic Ct Pct: 5 % — ABNORMAL HIGH (ref 0.4–3.1)

## 2023-08-18 LAB — FERRITIN: Ferritin: 18 ng/mL (ref 11–307)

## 2023-08-18 LAB — LACTATE DEHYDROGENASE: LDH: 100 U/L (ref 98–192)

## 2023-08-18 LAB — VITAMIN B12: Vitamin B-12: 272 pg/mL (ref 180–914)

## 2023-08-18 LAB — IRON AND TIBC
Iron: 39 ug/dL (ref 28–170)
Saturation Ratios: 9 % — ABNORMAL LOW (ref 10.4–31.8)
TIBC: 449 ug/dL (ref 250–450)
UIBC: 410 ug/dL

## 2023-08-18 LAB — PREPARE RBC (CROSSMATCH)

## 2023-08-18 LAB — FIBRINOGEN: Fibrinogen: 467 mg/dL (ref 210–475)

## 2023-08-18 LAB — D-DIMER, QUANTITATIVE: D-Dimer, Quant: 1.03 ug{FEU}/mL — ABNORMAL HIGH (ref 0.00–0.50)

## 2023-08-18 LAB — FOLATE: Folate: 13.7 ng/mL (ref >5.9)

## 2023-08-18 MED ORDER — ACETAMINOPHEN 650 MG RE SUPP: 650.0000 mg | Freq: Four times a day (QID) | RECTAL | Status: DC | PRN

## 2023-08-18 MED ORDER — MELATONIN 5 MG PO TABS
10.0000 mg | ORAL_TABLET | Freq: Every evening | ORAL | Status: DC | PRN
  Administered 2023-08-19: 10 mg via ORAL

## 2023-08-18 MED ORDER — PANTOPRAZOLE SODIUM 40 MG IV SOLR
40.0000 mg | Freq: Two times a day (BID) | INTRAVENOUS | Status: DC
  Administered 2023-08-18 – 2023-08-20 (×4): 40 mg via INTRAVENOUS
  Filled 2023-08-18 (×5): qty 10

## 2023-08-18 MED ORDER — ACETAMINOPHEN 325 MG PO TABS: 650.0000 mg | ORAL_TABLET | Freq: Four times a day (QID) | ORAL | Status: DC | PRN

## 2023-08-18 MED ORDER — ONDANSETRON HCL 4 MG/2ML IJ SOLN: 4.0000 mg | Freq: Four times a day (QID) | INTRAMUSCULAR | Status: DC | PRN

## 2023-08-18 MED ORDER — NITROGLYCERIN 0.4 MG SL SUBL: 0.4000 mg | SUBLINGUAL_TABLET | SUBLINGUAL | Status: DC | PRN

## 2023-08-18 MED ORDER — SODIUM CHLORIDE 0.9 % IV SOLN
10.0000 mL/h | Freq: Once | INTRAVENOUS | Status: AC
  Administered 2023-08-18: 10 mL/h via INTRAVENOUS

## 2023-08-18 MED ORDER — ONDANSETRON HCL 4 MG PO TABS: 4.0000 mg | ORAL_TABLET | Freq: Four times a day (QID) | ORAL | Status: DC | PRN

## 2023-08-18 MED ORDER — HYDRALAZINE HCL 20 MG/ML IJ SOLN: 5.0000 mg | Freq: Four times a day (QID) | INTRAMUSCULAR | Status: DC | PRN

## 2023-08-18 MED ORDER — METOPROLOL SUCCINATE ER 25 MG PO TB24
25.0000 mg | ORAL_TABLET | Freq: Every day | ORAL | Status: DC
  Administered 2023-08-18 – 2023-08-21 (×4): 25 mg via ORAL
  Filled 2023-08-18 (×4): qty 1

## 2023-08-18 MED ORDER — MELATONIN 5 MG PO TABS
5.0000 mg | ORAL_TABLET | Freq: Every evening | ORAL | Status: DC | PRN
  Administered 2023-08-18: 5 mg via ORAL
  Filled 2023-08-18: qty 1

## 2023-08-18 MED ORDER — ROSUVASTATIN CALCIUM 10 MG PO TABS
10.0000 mg | ORAL_TABLET | Freq: Every day | ORAL | Status: DC
  Administered 2023-08-18 – 2023-08-20 (×3): 10 mg via ORAL
  Filled 2023-08-18 (×3): qty 1

## 2023-08-18 MED ORDER — ROPINIROLE HCL 1 MG PO TABS
0.5000 mg | ORAL_TABLET | Freq: Two times a day (BID) | ORAL | Status: DC
  Administered 2023-08-18 – 2023-08-19 (×3): 0.5 mg via ORAL
  Filled 2023-08-18 (×2): qty 1
  Filled 2023-08-18: qty 2

## 2023-08-18 MED ORDER — LIDOCAINE 5 % EX PTCH
1.0000 | MEDICATED_PATCH | CUTANEOUS | Status: DC
  Filled 2023-08-18: qty 2
  Filled 2023-08-18: qty 1
  Filled 2023-08-18 (×2): qty 2

## 2023-08-18 MED ORDER — SODIUM CHLORIDE 0.9 % IV BOLUS
500.0000 mL | Freq: Once | INTRAVENOUS | Status: AC
  Administered 2023-08-18: 500 mL via INTRAVENOUS

## 2023-08-18 MED ORDER — PANTOPRAZOLE SODIUM 40 MG IV SOLR
40.0000 mg | Freq: Once | INTRAVENOUS | Status: AC
  Administered 2023-08-18: 40 mg via INTRAVENOUS
  Filled 2023-08-18: qty 10

## 2023-08-18 NOTE — Assessment & Plan Note (Addendum)
 Home metoprolol  succinate 25 mg daily resumed Hydralazine  5 mg IV every 6 hours as needed for SBP greater than 170, 5 days ordered

## 2023-08-18 NOTE — Assessment & Plan Note (Signed)
Home rosuvastatin 10 mg nightly resumed

## 2023-08-18 NOTE — Assessment & Plan Note (Signed)
 Home ropinirole  0.5 mg p.o. twice daily resumed

## 2023-08-18 NOTE — ED Notes (Signed)
 Pt rang call light. This nt went in to assist pt with changing into comfortable clothing to get some rest. Belongings at bedside. Call light in reach and bed locked in lowest position.

## 2023-08-18 NOTE — ED Notes (Signed)
 This tech assisted pt to the bathroom. Pt was assisted back to bed and in bed at this time with call bell in reach
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
## 2023-08-18 NOTE — ED Notes (Signed)
 Family atBS. EDP into room, at [facility] speaking with pt and family. Pt prefers sitting on side of bed, alert, NAD, calm, interactive.

## 2023-08-18 NOTE — Assessment & Plan Note (Addendum)
 Baseline hemoglobin is 10.9-12 over the last year Suspect upper GI bleed in setting of melena stool and patient on Eliquis 2.5 mg p.o. twice daily PIV: Please ensure and maintain 2 peripheral IV, peripheral large-bore Gastroenterology service has been consulted by EDP, Dr. Ole Berkeley is aware Transfuse for goal hemoglobin greater than 8 Admit to PCU, observation

## 2023-08-18 NOTE — ED Notes (Signed)
 Pt alert, NAD, calm, interactive, resps e/u, steady gait, ambulated from triage to exam room. Family at [facility].

## 2023-08-18 NOTE — Hospital Course (Addendum)
 [name] is an 89 year old female with history of hyperlipidemia, hypertension, GERD, restless leg syndrome, presents emergency department for chief concerns of abnormal labs. Patient had a sudden drop of hemoglobin from 10.9 from 4/28 to 6.1 on 5/14.  Patient admitted to have some dark-colored stool which was solid.  EGD performed, did not show any evidence of bleeding.  Capsule endoscopy was performed. Patient received 2 units of blood transfusion, also received B12, hemoglobin has been stabilized.  However, Endoscopy Showed Small Bowel Bleeding, Potentially from AVM.  Patient Has Been Accepted to [facility], Pending Bed Assignment. Will continue to monitor hemoglobin before transfer, transfuse as needed.

## 2023-08-18 NOTE — ED Notes (Signed)
 2nd pink top tube sent to blood back, sunquest issues/ unavailable, BB notified by phone.

## 2023-08-18 NOTE — ED Provider Notes (Signed)
 [facility] Provider Note    Event Date/Time   First MD Initiated Contact with Patient 08/18/23 0945     (approximate)   History   Chief Complaint: Abnormal Lab   HPI  Julie Khan is a 89 y.o. female with a history of multiple myeloma, CKD, GERD, hypertension who comes to the ED complaining of fatigue for the past 2 weeks and some dizziness with standing.  Saw her doctor yesterday, outpatient labs showed a hemoglobin of 6.3.  This is compared to a baseline hemoglobin of 10.9 on August 01, 2023.  She was also noted to have a lower extremity petechial rash and they were concerned about vasculitis.  She has an appointment with vascular surgery in 4 days.  An ESR was 127.  Denies chest pain or shortness of breath.  She does note that she has been having black bowel movements for about 5 days.  Takes aspirin  and Eliquis for a left leg DVT, last dose of Eliquis was yesterday morning.      Past Medical History:  Diagnosis Date   Arthritis    Bladder cancer (HCC)    Bladder cancer (HCC)    Cancer (HCC)    multiple myeloma   Chronic kidney disease    Chronic kidney disease    Dizziness    GERD (gastroesophageal reflux disease)    Hypertension    Multiple myeloma (HCC)    Stroke (HCC)    tia x 2   Tubular adenoma of colon     Current Outpatient Rx   Order #: [id number] Class: Historical Med   Order #: [id number] Class: Historical Med   Order #: [id number] Class: Historical Med   Order #: [id number] Class: Historical Med   Order #: [id number] Class: Historical Med   Order #: [id number] Class: Normal   Order #: [id number] Class: Historical Med   Order #: [id number] Class: Historical Med   Order #: [id number] Class: Historical Med   Order #: [id number] Class: Normal   Order #: [id number] Class: Historical Med   Order #: [id number] Class: Historical Med   Order #: [id number] Class: Historical Med   Order #: [id number] Class: Historical Med    Past Surgical History:   Procedure Laterality Date   bladder tumor removed     BREAST EXCISIONAL BIOPSY Bilateral 1970   Benign   BREAST SURGERY     bx   CATARACT EXTRACTION W/PHACO Left 12/16/2016   Procedure: CATARACT EXTRACTION PHACO AND INTRAOCULAR LENS PLACEMENT (IOC);  Surgeon: Rosa College, MD;  Location: [facility] ORS;  Service: Ophthalmology;  Laterality: Left;  US  00:38.0 AP% 14.6 CDE 5.54 Fluid pack lot # [id number]   CATARACT EXTRACTION W/PHACO Right 03/03/2017   Procedure: CATARACT EXTRACTION PHACO AND INTRAOCULAR LENS PLACEMENT (IOC);  Surgeon: Rosa College, MD;  Location: [facility] ORS;  Service: Ophthalmology;  Laterality: Right;  Lot # [id number] US : 00:29.8 AP%: 10.1 CDE: 3.01   COLONOSCOPY WITH PROPOFOL  N/A 07/25/2015   Procedure: COLONOSCOPY WITH PROPOFOL ;  Surgeon: Luella Sager, MD;  Location: [facility] ENDOSCOPY;  Service: Endoscopy;  Laterality: N/A;   COLONOSCOPY WITH PROPOFOL  N/A 06/29/2017   Procedure: COLONOSCOPY WITH PROPOFOL ;  Surgeon: Toledo, Alphonsus Jeans, MD;  Location: [facility] ENDOSCOPY;  Service: Gastroenterology;  Laterality: N/A;   COLONOSCOPY WITH PROPOFOL  N/A 07/14/2022   Procedure: COLONOSCOPY WITH PROPOFOL ;  Surgeon: Toledo, Alphonsus Jeans, MD;  Location: [facility] ENDOSCOPY;  Service: Gastroenterology;  Laterality: N/A;   CORONARY ANGIOGRAPHY N/A 05/24/2019   Procedure: CORONARY ANGIOGRAPHY;  Surgeon: Cherrie Cornwall, MD;  Location: [facility] INVASIVE CV LAB;  Service: Cardiovascular;  Laterality: N/A;   ESOPHAGOGASTRODUODENOSCOPY (EGD) WITH PROPOFOL  N/A 07/25/2015   Procedure: ESOPHAGOGASTRODUODENOSCOPY (EGD) WITH PROPOFOL ;  Surgeon: Luella Sager, MD;  Location: [facility] ENDOSCOPY;  Service: Endoscopy;  Laterality: N/A;   ESOPHAGOGASTRODUODENOSCOPY (EGD) WITH PROPOFOL  N/A 06/29/2017   Procedure: ESOPHAGOGASTRODUODENOSCOPY (EGD) WITH PROPOFOL ;  Surgeon: Toledo, Alphonsus Jeans, MD;  Location: [facility] ENDOSCOPY;  Service: Gastroenterology;  Laterality: N/A;   HERNIA REPAIR     LEFT HEART CATH N/A  05/24/2019   Procedure: Left Heart Cath;  Surgeon: Cherrie Cornwall, MD;  Location: [facility] INVASIVE CV LAB;  Service: Cardiovascular;  Laterality: N/A;    Physical Exam   Triage Vital Signs: ED Triage Vitals [08/18/23 0930]  Encounter Vitals Group     BP 119/63     Systolic BP Percentile      Diastolic BP Percentile      Pulse Rate 84     Resp 17     Temp 98 F (36.7 C)     Temp Source Oral     SpO2 98 %     Weight 160 lb (72.6 kg)     Height 5' (1.524 m)     Head Circumference      Peak Flow      Pain Score 0     Pain Loc      Pain Education      Exclude from Growth Chart     Most recent vital signs: Vitals:   08/18/23 1145 08/18/23 1149  BP: (!) 147/67   Pulse: 68 70  Resp: 14 18  Temp:    SpO2: 100% 100%    General: Awake, no distress.  CV:  Good peripheral perfusion.  Regular rate and rhythm Resp:  Normal effort.  Clear to auscultation bilaterally Abd:  No distention.  Soft nontender.  Declines rectal exam Other:  Petechial rash on bilateral lower extremities.  No ecchymosis or tenderness.   ED Results / Procedures / Treatments   Labs (all labs ordered are listed, but only abnormal results are displayed) Labs Reviewed  COMPREHENSIVE METABOLIC PANEL WITH GFR - Abnormal; Notable for the following components:      Result Value   Sodium 133 (*)    Glucose, Bld 119 (*)    BUN 36 (*)    Creatinine, Ser 1.48 (*)    Calcium  7.6 (*)    Albumin 3.1 (*)    GFR, Estimated 34 (*)    Anion gap 3 (*)    All other components within normal limits  CBC - Abnormal; Notable for the following components:   RBC 1.92 (*)    Hemoglobin 6.1 (*)    HCT 18.6 (*)    All other components within normal limits  D-DIMER, QUANTITATIVE - Abnormal; Notable for the following components:   D-Dimer, Quant 1.03 (*)    All other components within normal limits  IRON AND TIBC - Abnormal; Notable for the following components:   Saturation Ratios 9 (*)    All other components within  normal limits  RETICULOCYTES - Abnormal; Notable for the following components:   Retic Ct Pct 5.0 (*)    RBC. 1.92 (*)    Immature Retic Fract 34.7 (*)    All other components within normal limits  LACTATE DEHYDROGENASE  FIBRINOGEN  FOLATE  FERRITIN  HAPTOGLOBIN  VITAMIN B12  POC OCCULT BLOOD, ED  TYPE AND SCREEN  PREPARE RBC (CROSSMATCH)  ABO/RH     EKG    RADIOLOGY  PROCEDURES:  .Critical Care  Performed by: Jacquie Maudlin, MD Authorized by: Jacquie Maudlin, MD   Critical care provider statement:    Critical care time (minutes):  35   Critical care time was exclusive of:  Separately billable procedures and treating other patients   Critical care was necessary to treat or prevent imminent or life-threatening deterioration of the following conditions:  Circulatory failure   Critical care was time spent personally by me on the following activities:  Development of treatment plan with patient or surrogate, discussions with consultants, evaluation of patient's response to treatment, examination of patient, obtaining history from patient or surrogate, ordering and performing treatments and interventions, ordering and review of laboratory studies, ordering and review of radiographic studies, pulse oximetry, re-evaluation of patient's condition and review of old charts   Care discussed with: admitting provider      MEDICATIONS ORDERED IN ED: Medications  0.9 %  sodium chloride  infusion (has no administration in time range)  sodium chloride  0.9 % bolus 500 mL (has no administration in time range)  pantoprazole  (PROTONIX ) injection 40 mg (has no administration in time range)     IMPRESSION / MDM / ASSESSMENT AND PLAN / ED COURSE  I reviewed the triage vital signs and the nursing notes.  DDx: Symptomatic anemia, GI bleed, electrolyte derangement, thrombocytopenia, intravascular hemolysis  Patient's presentation is most consistent with acute presentation with  potential threat to life or bodily function.  Patient presents with symptoms of symptomatic anemia.  Hemoglobin today is 6.1.  Will start Red cell transfusion.  Patient declines DRE right now, will try to provide a stool sample for Hemoccult testing.  Will need to admit for further evaluation and management of her acute anemia with a 5 point hemoglobin drop in the past 2 weeks.   Clinical Course as of 08/18/23 1202  Thu Aug 18, 2023  1156 DRE reveals melanotic FOBT+ stool, c/w ugib [PS]    Clinical Course User Index [PS] Jacquie Maudlin, MD    ----------------------------------------- 12:36 PM on 08/18/2023 ----------------------------------------- Case discussed with hospitalist   FINAL CLINICAL IMPRESSION(S) / ED DIAGNOSES   Final diagnoses:  Symptomatic anemia  Upper GI bleeding     Rx / DC Orders   ED Discharge Orders     None        Note:  This document was prepared using Dragon voice recognition software and may include unintentional dictation errors.   Jacquie Maudlin, MD 08/18/23 1236

## 2023-08-18 NOTE — ED Notes (Signed)
 PT resting comfortably, states"can I be taken off of these wires so I can be free. " Pt informed the use of cardiac monitor and pulse ox to monitor VS in regards to admission status. Pt verbalized understanding and monitoring remains in place at this time. NAD noted

## 2023-08-18 NOTE — Assessment & Plan Note (Signed)
 Diagnosis in April 2025, patient has been on Eliquis since Home Eliquis 2.5 mg p.o. twice daily not resumed on admission Patient reports her last dose of Eliquis was evening of 08/17/2023

## 2023-08-18 NOTE — Assessment & Plan Note (Signed)
 Continue outpatient follow-up with hematologist/oncologist

## 2023-08-18 NOTE — ED Notes (Signed)
 MD at [facility]

## 2023-08-18 NOTE — ED Notes (Signed)
 Daughter at [facility]. Consent signed to receive blood transfusion, paper form in chart

## 2023-08-18 NOTE — ED Triage Notes (Signed)
 Patient to ED via POV for hemoglobin of 6.3. States she has been feeling weak x1 week. Pale in color. Denies blood in stool.
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
## 2023-08-19 ENCOUNTER — Observation Stay: Admitting: Anesthesiology

## 2023-08-19 ENCOUNTER — Encounter
Admission: EM | Disposition: A | Discharge status: Other Acute Inpatient Hospital | Payer: Self-pay | Source: Home / Self Care | Attending: Emergency Medicine

## 2023-08-19 DIAGNOSIS — N1832 Chronic kidney disease, stage 3b: Secondary | ICD-10-CM | POA: Diagnosis not present

## 2023-08-19 DIAGNOSIS — Z86718 Personal history of other venous thrombosis and embolism: Secondary | ICD-10-CM | POA: Diagnosis not present

## 2023-08-19 DIAGNOSIS — K449 Diaphragmatic hernia without obstruction or gangrene: Secondary | ICD-10-CM | POA: Diagnosis not present

## 2023-08-19 DIAGNOSIS — K922 Gastrointestinal hemorrhage, unspecified: Secondary | ICD-10-CM | POA: Diagnosis not present

## 2023-08-19 DIAGNOSIS — I2510 Atherosclerotic heart disease of native coronary artery without angina pectoris: Secondary | ICD-10-CM | POA: Diagnosis not present

## 2023-08-19 DIAGNOSIS — D62 Acute posthemorrhagic anemia: Secondary | ICD-10-CM | POA: Diagnosis not present

## 2023-08-19 DIAGNOSIS — D631 Anemia in chronic kidney disease: Secondary | ICD-10-CM | POA: Diagnosis not present

## 2023-08-19 DIAGNOSIS — D509 Iron deficiency anemia, unspecified: Secondary | ICD-10-CM | POA: Diagnosis not present

## 2023-08-19 DIAGNOSIS — Z87891 Personal history of nicotine dependence: Secondary | ICD-10-CM | POA: Diagnosis not present

## 2023-08-19 DIAGNOSIS — D649 Anemia, unspecified: Secondary | ICD-10-CM | POA: Diagnosis not present

## 2023-08-19 DIAGNOSIS — N183 Chronic kidney disease, stage 3 unspecified: Secondary | ICD-10-CM | POA: Diagnosis not present

## 2023-08-19 DIAGNOSIS — I110 Hypertensive heart disease with heart failure: Secondary | ICD-10-CM | POA: Diagnosis not present

## 2023-08-19 HISTORY — PX: ESOPHAGOGASTRODUODENOSCOPY: SHX5428

## 2023-08-19 LAB — CBC
HCT: 20.9 % — ABNORMAL LOW (ref 36.0–46.0)
Hemoglobin: 6.8 g/dL — ABNORMAL LOW (ref 12.0–15.0)
MCH: 31.2 pg (ref 26.0–34.0)
MCHC: 32.5 g/dL (ref 30.0–36.0)
MCV: 95.9 fL (ref 80.0–100.0)
Platelets: 203 10*3/uL (ref 150–400)
RBC: 2.18 MIL/uL — ABNORMAL LOW (ref 3.87–5.11)
RDW: 15.5 % (ref 11.5–15.5)
WBC: 3.6 10*3/uL — ABNORMAL LOW (ref 4.0–10.5)
nRBC: 0 % (ref 0.0–0.2)

## 2023-08-19 LAB — PREPARE RBC (CROSSMATCH)

## 2023-08-19 LAB — BASIC METABOLIC PANEL WITH GFR
Anion gap: 7 (ref 5–15)
BUN: 27 mg/dL — ABNORMAL HIGH (ref 8–23)
CO2: 20 mmol/L — ABNORMAL LOW (ref 22–32)
Calcium: 8.3 mg/dL — ABNORMAL LOW (ref 8.9–10.3)
Chloride: 108 mmol/L (ref 98–111)
Creatinine, Ser: 1.22 mg/dL — ABNORMAL HIGH (ref 0.44–1.00)
GFR, Estimated: 43 mL/min — ABNORMAL LOW (ref >60)
Glucose, Bld: 90 mg/dL (ref 70–99)
Potassium: 4.7 mmol/L (ref 3.5–5.1)
Sodium: 135 mmol/L (ref 135–145)

## 2023-08-19 LAB — HEMOGLOBIN: Hemoglobin: 8.3 g/dL — ABNORMAL LOW (ref 12.0–15.0)

## 2023-08-19 SURGERY — EGD (ESOPHAGOGASTRODUODENOSCOPY)
Anesthesia: General

## 2023-08-19 MED ORDER — LIDOCAINE HCL (PF) 2 % IJ SOLN
INTRAMUSCULAR | Status: AC
  Filled 2023-08-19: qty 5

## 2023-08-19 MED ORDER — PROPOFOL 10 MG/ML IV BOLUS
INTRAVENOUS | Status: DC | PRN
  Administered 2023-08-19 (×2): 20 mg via INTRAVENOUS
  Administered 2023-08-19: 100 mg via INTRAVENOUS

## 2023-08-19 MED ORDER — SODIUM CHLORIDE 0.9% IV SOLUTION: Freq: Once | INTRAVENOUS | Status: AC

## 2023-08-19 MED ORDER — FUROSEMIDE 10 MG/ML IJ SOLN
20.0000 mg | Freq: Once | INTRAMUSCULAR | Status: AC
  Administered 2023-08-19: 20 mg via INTRAVENOUS
  Filled 2023-08-19: qty 2

## 2023-08-19 MED ORDER — CYANOCOBALAMIN 1000 MCG/ML IJ SOLN
1000.0000 ug | Freq: Every day | INTRAMUSCULAR | Status: DC
  Administered 2023-08-19 – 2023-08-21 (×3): 1000 ug via INTRAMUSCULAR
  Filled 2023-08-19 (×3): qty 1

## 2023-08-19 MED ORDER — SODIUM CHLORIDE 0.9 % IV SOLN
INTRAVENOUS | Status: DC
Start: 2023-08-19 — End: 2023-08-19

## 2023-08-19 MED ORDER — PROPOFOL 10 MG/ML IV BOLUS
INTRAVENOUS | Status: AC
  Filled 2023-08-19: qty 40

## 2023-08-19 MED ORDER — LIDOCAINE HCL (PF) 2 % IJ SOLN
INTRAMUSCULAR | Status: DC | PRN
Start: 2023-08-19 — End: 2023-08-19
  Administered 2023-08-19: 100 mg via INTRADERMAL

## 2023-08-19 MED ORDER — IRON SUCROSE 200 MG IVPB - SIMPLE MED
200.0000 mg | Freq: Once | Status: DC
  Filled 2023-08-19: qty 110

## 2023-08-19 NOTE — Transfer of Care (Signed)
 Immediate Anesthesia Transfer of Care Note  Patient: Julie Khan  Procedure(s) Performed: EGD (ESOPHAGOGASTRODUODENOSCOPY)  Patient Location: Endoscopy Unit  Anesthesia Type:General  Level of Consciousness: drowsy  Airway & Oxygen Therapy: Patient Spontanous Breathing  Post-op Assessment: Report given to RN and Post -op Vital signs reviewed and stable  Post vital signs: Reviewed and stable  Last Vitals:  Vitals Value Taken Time  BP 104/57 08/19/23 0759  Temp 35.8 0759  Pulse 69 08/19/23 0759  Resp 16 08/19/23 0759  SpO2 98 % 08/19/23 0759  Vitals shown include unfiled device data.  Last Pain:  Vitals:   08/19/23 0725  TempSrc: Temporal  PainSc: 0-No pain         Complications: No notable events documented.

## 2023-08-19 NOTE — Anesthesia Preprocedure Evaluation (Signed)
 Anesthesia Evaluation  Patient identified by MRN, date of birth, ID band Patient awake    Reviewed: Allergy & Precautions, NPO status , Patient's Chart, lab work & pertinent test results  Airway Mallampati: III  TM Distance: >3 FB Neck ROM: full    Dental  (+) Teeth Intact   Pulmonary neg pulmonary ROS, Patient abstained from smoking., former smoker   Pulmonary exam normal breath sounds clear to auscultation       Cardiovascular Exercise Tolerance: Good hypertension, Pt. on medications + CAD  negative cardio ROS Normal cardiovascular exam Rhythm:Regular Rate:Normal     Neuro/Psych CVA negative neurological ROS  negative psych ROS   GI/Hepatic negative GI ROS, Neg liver ROS,GERD  Medicated,,  Endo/Other  negative endocrine ROS    Renal/GU Renal InsufficiencyRenal diseasenegative Renal ROS  negative genitourinary   Musculoskeletal   Abdominal Normal abdominal exam  (+)   Peds negative pediatric ROS (+)  Hematology negative hematology ROS (+) Blood dyscrasia, anemia   Anesthesia Other Findings Past Medical History: No date: Arthritis No date: Bladder cancer (HCC) No date: Bladder cancer (HCC) No date: Cancer (HCC)     Comment:  multiple myeloma No date: Chronic kidney disease No date: Chronic kidney disease No date: Dizziness No date: GERD (gastroesophageal reflux disease) No date: Hypertension No date: Multiple myeloma (HCC) No date: Stroke [facility])     Comment:  tia x 2 No date: Tubular adenoma of colon  Past Surgical History: No date: bladder tumor removed 1970: BREAST EXCISIONAL BIOPSY; Bilateral     Comment:  Benign No date: BREAST SURGERY     Comment:  bx 12/16/2016: CATARACT EXTRACTION W/PHACO; Left     Comment:  Procedure: CATARACT EXTRACTION PHACO AND INTRAOCULAR               LENS PLACEMENT (IOC);  Surgeon: Rosa College, MD;                Location: [facility] ORS;  Service: Ophthalmology;   Laterality:              Left;  US  00:38.0 AP% 14.6 CDE 5.54 Fluid pack lot #               [id number] 03/03/2017: CATARACT EXTRACTION W/PHACO; Right     Comment:  Procedure: CATARACT EXTRACTION PHACO AND INTRAOCULAR               LENS PLACEMENT (IOC);  Surgeon: Rosa College, MD;                Location: [facility] ORS;  Service: Ophthalmology;  Laterality:              Right;  Lot # [id number] H US : 00:29.8 AP%: 10.1 CDE: 3.01 07/25/2015: COLONOSCOPY WITH PROPOFOL ; N/A     Comment:  Procedure: COLONOSCOPY WITH PROPOFOL ;  Surgeon: Luella Sager, MD;  Location: [facility] ENDOSCOPY;  Service:               Endoscopy;  Laterality: N/A; 06/29/2017: COLONOSCOPY WITH PROPOFOL ; N/A     Comment:  Procedure: COLONOSCOPY WITH PROPOFOL ;  Surgeon: Toledo,               Alphonsus Jeans, MD;  Location: [facility] ENDOSCOPY;  Service:               Gastroenterology;  Laterality: N/A; 07/14/2022: COLONOSCOPY WITH PROPOFOL ; N/A     Comment:  Procedure:  COLONOSCOPY WITH PROPOFOL ;  Surgeon: Toledo,               Alphonsus Jeans, MD;  Location: [facility] ENDOSCOPY;  Service:               Gastroenterology;  Laterality: N/A; 05/24/2019: CORONARY ANGIOGRAPHY; N/A     Comment:  Procedure: CORONARY ANGIOGRAPHY;  Surgeon: Cherrie Cornwall, MD;  Location: [facility] INVASIVE CV LAB;  Service:               Cardiovascular;  Laterality: N/A; 07/25/2015: ESOPHAGOGASTRODUODENOSCOPY (EGD) WITH PROPOFOL ; N/A     Comment:  Procedure: ESOPHAGOGASTRODUODENOSCOPY (EGD) WITH               PROPOFOL ;  Surgeon: Luella Sager, MD;  Location:               [facility] ENDOSCOPY;  Service: Endoscopy;  Laterality: N/A; 06/29/2017: ESOPHAGOGASTRODUODENOSCOPY (EGD) WITH PROPOFOL ; N/A     Comment:  Procedure: ESOPHAGOGASTRODUODENOSCOPY (EGD) WITH               PROPOFOL ;  Surgeon: Toledo, Alphonsus Jeans, MD;  Location:               [facility] ENDOSCOPY;  Service: Gastroenterology;  Laterality:               N/A; No date: HERNIA REPAIR 05/24/2019:  LEFT HEART CATH; N/A     Comment:  Procedure: Left Heart Cath;  Surgeon: Cherrie Cornwall,               MD;  Location: [facility] INVASIVE CV LAB;  Service:               Cardiovascular;  Laterality: N/A;  BMI    Body Mass Index: 31.95 kg/m      Reproductive/Obstetrics negative OB ROS                             Anesthesia Physical Anesthesia Plan  ASA: 3  Anesthesia Plan: General   Post-op Pain Management:    Induction: Intravenous  PONV Risk Score and Plan: Propofol  infusion and TIVA  Airway Management Planned:   Additional Equipment:   Intra-op Plan:   Post-operative Plan:   Informed Consent: I have reviewed the patients History and Physical, chart, labs and discussed the procedure including the risks, benefits and alternatives for the proposed anesthesia with the patient or authorized representative who has indicated his/her understanding and acceptance.     Dental Advisory Given  Plan Discussed with: CRNA  Anesthesia Plan Comments:        Anesthesia Quick Evaluation

## 2023-08-19 NOTE — Plan of Care (Signed)

## 2023-08-19 NOTE — Progress Notes (Signed)
  Progress Note   Patient: Julie Khan WGN:[id number] DOB: 29-Aug-1934 DOA: 08/18/2023     0 DOS: the patient was seen and examined on 08/19/2023   Brief hospital course: [name] is an 89 year old female with history of hyperlipidemia, hypertension, GERD, restless leg syndrome, presents emergency department for chief concerns of abnormal labs. Patient had a sudden drop of hemoglobin from 10.9 from 4/28 to 6.1 on 5/14.  Patient admitted to have some dark-colored stool which was solid.  EGD performed, did not show any evidence of bleeding.  Capsule endoscopy was performed. Patient received 1 unit of PRBC, hemoglobin went up to 6.8.    Principal Problem:   Symptomatic anemia Active Problems:   Smoldering myeloma   Hypertension   IDA (iron deficiency anemia)   CKD stage 3b, GFR 30-44 ml/min (HCC)   Coronary artery disease involving native coronary artery of native heart without angina pectoris   Gastroesophageal reflux disease without esophagitis   RLS (restless legs syndrome)   Hyperlipidemia   History of DVT (deep vein thrombosis)   Assessment and Plan: * Symptomatic anemia Likely B12 deficient anemia. Baseline hemoglobin is 10.9 Suspect upper GI bleed in setting of melena stool and patient on Eliquis 2.5 mg p.o. twice daily Seen by GI, EGD was performed, did not show any evidence of bleeding.  Small bowel capsule endoscopy performed. Hemoglobin went up to 6.8 from 6.1.  Will give another unit of PRBC. Iron level was normal, B12 level 272, start B12 injection. Continue to follow hemoglobin, transfuse as needed.  Hyponatremia. Minimal metabolic acidosis. Chronic kidney disease stage IIIb. Started sodium bicarb, renal function stable.   History of DVT (deep vein thrombosis) Diagnosis in April 2025, patient has been on Eliquis since Hold Eliquis for now, may consider restart tomorrow if small bowel capsule endoscopy did not show any evidence of  bleeding.  Hyperlipidemia Home rosuvastatin  10 mg nightly resumed  RLS (restless legs syndrome) Home ropinirole  0.5 mg p.o. twice daily resumed  Hypertension Resume home medicines.  Smoldering myeloma Continue outpatient follow-up with hematologist/oncologist       Subjective:  Patient doing well today, no additional bowel movement.  Physical Exam: Vitals:   08/19/23 0829 08/19/23 0929 08/19/23 1147 08/19/23 1203  BP: 118/60 (!) 148/77 113/67 115/85  Pulse: 72  66   Resp: 20 18 19 15   Temp:  97.6 F (36.4 C) 97.7 F (36.5 C) 97.7 F (36.5 C)  TempSrc:  Oral Oral Oral  SpO2: 96% 100% 94% 99%  Weight:      Height:       General exam: Appears calm and comfortable  Respiratory system: Clear to auscultation. Respiratory effort normal. Cardiovascular system: S1 & S2 heard, RRR. No JVD, murmurs, rubs, gallops or clicks. No pedal edema. Gastrointestinal system: Abdomen is nondistended, soft and nontender. No organomegaly or masses felt. Normal bowel sounds heard. Central nervous system: Alert and oriented. No focal neurological deficits. Extremities: Symmetric 5 x 5 power. Skin: No rashes, lesions or ulcers Psychiatry: Judgement and insight appear normal. Mood & affect appropriate.    Data Reviewed:  Reviewed EGD results, imaging studies, lab results.  Family Communication: None  Disposition: Status is: Observation     Time spent: 50 minutes  Author: Donaciano Frizzle, MD 08/19/2023 12:21 PM  For on call review [email or web address].

## 2023-08-19 NOTE — Op Note (Signed)
 [facility] Gastroenterology Patient Name: Julie Khan Procedure Date: 08/19/2023 7:41 AM MRN: [id number] Account #: [id number] Date of Birth: [birth year] Admit Type: Inpatient Age: 89 Room: [facility] ENDO ROOM 3 Gender: Female Note Status: Finalized Instrument Name: Upper Endoscope [id number] Procedure:             Upper GI endoscopy Indications:           Iron deficiency anemia Providers:             [name] MD, MD Referring MD:          No Local Md, MD (Referring MD) Medicines:             Propofol  per Anesthesia Complications:         No immediate complications. Procedure:             Pre-Anesthesia Assessment:                        - Prior to the procedure, a History and Physical was                         performed, and patient medications and allergies were                         reviewed. The patient's tolerance of previous                         anesthesia was also reviewed. The risks and benefits                         of the procedure and the sedation options and risks                         were discussed with the patient. All questions were                         answered, and informed consent was obtained. Prior                         Anticoagulants: The patient has taken Eliquis                         (apixaban), last dose was 2 days prior to procedure.                         ASA Grade Assessment: II - A patient with mild                         systemic disease. After reviewing the risks and                         benefits, the patient was deemed in satisfactory                         condition to undergo the procedure.                        After obtaining informed consent, the endoscope was  passed under direct vision. Throughout the procedure,                         the patient's blood pressure, pulse, and oxygen                         saturations were monitored continuously. The Endoscope                          was introduced through the mouth, and advanced to the                         third part of duodenum. The upper GI endoscopy was                         accomplished without difficulty. The patient tolerated                         the procedure well. Findings:      A medium-sized hiatal hernia was present.      The stomach was normal.      The examined duodenum was normal. Impression:            - Medium-sized hiatal hernia.                        - Normal stomach.                        - Normal examined duodenum.                        - No specimens collected. Recommendation:        - Return patient to hospital ward for ongoing care.                        - Resume previous diet.                        - Continue present medications.                        - To visualize the small bowel, perform video capsule                         endoscopy today. Procedure Code(s):     --- Professional ---                        [phone], Esophagogastroduodenoscopy, flexible,                         transoral; diagnostic, including collection of                         specimen(s) by brushing or washing, when performed                         (separate procedure) Diagnosis Code(s):     --- Professional ---  D50.9, Iron deficiency anemia, unspecified CPT copyright 2022 American [employer] Association. All rights reserved. The codes documented in this report are preliminary and upon coder review may  be revised to meet current compliance requirements. [name] MD, MD 08/19/2023 7:54:56 AM This report has been signed electronically. Number of Addenda: 0 Note Initiated On: 08/19/2023 7:41 AM Estimated Blood Loss:  Estimated blood loss: none.      [facility]

## 2023-08-19 NOTE — Care Management Obs Status (Signed)
 MEDICARE OBSERVATION STATUS NOTIFICATION   Patient Details  Name: Julie Khan MRN: [id number] Date of Birth: 11/15/1934   Medicare Observation Status Notification Given:  Yes    [place] L Akiel Fennell, LCSW 08/19/2023, 4:04 PM

## 2023-08-19 NOTE — Anesthesia Postprocedure Evaluation (Signed)
 Anesthesia Post Note  Patient: Julie Khan  Procedure(s) Performed: EGD (ESOPHAGOGASTRODUODENOSCOPY)  Patient location during evaluation: PACU Anesthesia Type: General Level of consciousness: awake and awake and alert Pain management: satisfactory to patient Vital Signs Assessment: post-procedure vital signs reviewed and stable Respiratory status: spontaneous breathing Cardiovascular status: blood pressure returned to baseline Anesthetic complications: no   No notable events documented.   Last Vitals:  Vitals:   08/19/23 0809 08/19/23 0819  BP: (!) 88/41 (!) 109/55  Pulse: 68 71  Resp: 14 15  Temp:    SpO2: 96% 97%    Last Pain:  Vitals:   08/19/23 0819  TempSrc:   PainSc: 0-No pain                 VAN STAVEREN,Oral [place]

## 2023-08-19 NOTE — OR Nursing (Signed)
 Applied equipment and gave instructions to patient and floor RN for givens capsule study. Mentioned to patient will come to floor to remove equipment at 430pm.

## 2023-08-20 ENCOUNTER — Encounter: Payer: Self-pay | Admitting: Gastroenterology

## 2023-08-20 DIAGNOSIS — D509 Iron deficiency anemia, unspecified: Secondary | ICD-10-CM

## 2023-08-20 DIAGNOSIS — Z86718 Personal history of other venous thrombosis and embolism: Secondary | ICD-10-CM | POA: Diagnosis not present

## 2023-08-20 DIAGNOSIS — K922 Gastrointestinal hemorrhage, unspecified: Secondary | ICD-10-CM

## 2023-08-20 DIAGNOSIS — D62 Acute posthemorrhagic anemia: Secondary | ICD-10-CM | POA: Diagnosis not present

## 2023-08-20 DIAGNOSIS — D649 Anemia, unspecified: Secondary | ICD-10-CM | POA: Diagnosis not present

## 2023-08-20 DIAGNOSIS — N1832 Chronic kidney disease, stage 3b: Secondary | ICD-10-CM | POA: Diagnosis not present

## 2023-08-20 LAB — BASIC METABOLIC PANEL WITH GFR
Anion gap: 6 (ref 5–15)
BUN: 25 mg/dL — ABNORMAL HIGH (ref 8–23)
CO2: 20 mmol/L — ABNORMAL LOW (ref 22–32)
Calcium: 8.2 mg/dL — ABNORMAL LOW (ref 8.9–10.3)
Chloride: 107 mmol/L (ref 98–111)
Creatinine, Ser: 1.36 mg/dL — ABNORMAL HIGH (ref 0.44–1.00)
GFR, Estimated: 38 mL/min — ABNORMAL LOW (ref >60)
Glucose, Bld: 91 mg/dL (ref 70–99)
Potassium: 4.5 mmol/L (ref 3.5–5.1)
Sodium: 133 mmol/L — ABNORMAL LOW (ref 135–145)

## 2023-08-20 LAB — MAGNESIUM: Magnesium: 1.9 mg/dL (ref 1.7–2.4)

## 2023-08-20 LAB — HEMOGLOBIN
Hemoglobin: 8.1 g/dL — ABNORMAL LOW (ref 12.0–15.0)
Hemoglobin: 8.6 g/dL — ABNORMAL LOW (ref 12.0–15.0)
Hemoglobin: 8.7 g/dL — ABNORMAL LOW (ref 12.0–15.0)
Hemoglobin: 9.1 g/dL — ABNORMAL LOW (ref 12.0–15.0)

## 2023-08-20 LAB — BPAM RBC
Blood Product Expiration Date: 202506142359
Blood Product Expiration Date: 202506142359
ISSUE DATE / TIME: 202505151114
ISSUE DATE / TIME: 202505161323
Unit Type and Rh: 6200
Unit Type and Rh: 6200

## 2023-08-20 LAB — TYPE AND SCREEN
ABO/RH(D): AB POS
Antibody Screen: NEGATIVE
Unit division: 0
Unit division: 0

## 2023-08-20 LAB — HAPTOGLOBIN: Haptoglobin: 129 mg/dL (ref 41–333)

## 2023-08-20 MED ORDER — CYANOCOBALAMIN 1000 MCG/ML IJ SOLN: 1000.0000 ug | Freq: Every day | INTRAMUSCULAR | Status: AC

## 2023-08-20 MED ORDER — ROPINIROLE HCL 1 MG PO TABS
0.5000 mg | ORAL_TABLET | Freq: Two times a day (BID) | ORAL | Status: DC
  Administered 2023-08-20 – 2023-08-21 (×3): 0.5 mg via ORAL
  Filled 2023-08-20 (×3): qty 1
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
## 2023-08-20 NOTE — Progress Notes (Signed)
 Pt. refused vital signs, nurse was notified.

## 2023-08-20 NOTE — Progress Notes (Signed)
   Luke Salaam , MD [address], [place], [place], [address] [address], [address], [place], [place], [address] Phone: [phone]  Fax: [phone]   Arcadia Gorgas is being followed for iron  def anemia   Subjective: Having black stools , no abdominal pain    Objective: Vital signs in last 24 hours: Vitals:   08/19/23 2008 08/20/23 0011 08/20/23 0419 08/20/23 0827  BP: 125/72 131/67 138/71 117/72  Pulse: 67 73 79 72  Resp: 20 20 20    Temp: 98.1 F (36.7 C) 98.3 F (36.8 C) 97.7 F (36.5 C) 98 F (36.7 C)  TempSrc: Oral     SpO2: 98% 96% 98% 97%  Weight:      Height:       Weight change:   Intake/Output Summary (Last 24 hours) at 08/20/2023 1235 Last data filed at 08/20/2023 0500 Gross per 24 hour  Intake 690 ml  Output --  Net 690 ml     Exam: Neuro : moving all 4 limbs , no neuro deficit   Lab Results: @LABTEST2 @ Micro Results: No results found for this or any previous visit (from the past 240 hours). Studies/Results: No results found. Medications: I have reviewed the patient's current medications. Scheduled Meds:  cyanocobalamin   1,000 mcg Intramuscular Daily   lidocaine   1-2 patch Transdermal Q24H   metoprolol  succinate  25 mg Oral Daily   pantoprazole  (PROTONIX ) IV  40 mg Intravenous BID   rOPINIRole   0.5 mg Oral BID   rosuvastatin   10 mg Oral QHS   Continuous Infusions: PRN Meds:.acetaminophen  **OR** acetaminophen , hydrALAZINE , melatonin, nitroGLYCERIN , ondansetron  **OR** ondansetron  (ZOFRAN ) IV   Assessment: Principal Problem:   Symptomatic anemia Active Problems:   Smoldering myeloma   Hypertension   IDA (iron  deficiency anemia)   CKD stage 3b, GFR 30-44 ml/min (HCC)   Coronary artery disease involving native coronary artery of native heart without angina pectoris   Gastroesophageal reflux disease without esophagitis   RLS (restless legs syndrome)   Hyperlipidemia   History of DVT (deep vein thrombosis)   Julie  Khan 89 y.o. female with iron  deficiency anemia and melena, EGD+colonoscopy negative. Capsule study of the small bowel shows multiple areas of active bleeding in the proximal and mid small bowel likely due to AVM's from CKD.  Plan:  Monitor CBC and transfuse as need3d Continue PPI 3. [facility] to [facility] for small bowel baloon enteroscopy to control bleeding as we do not have the equipment here . I have discussed with Dr Jeane Miguel and patient   I will sign off.  Please call me if any further GI concerns or questions.  We would like to thank you for the opportunity to participate in the care of Julie Khan.    LOS: 0 days   Luke Salaam, MD 08/20/2023, 12:35 PM

## 2023-08-20 NOTE — Plan of Care (Signed)
  Problem: Education: Goal: Knowledge of General Education information will improve Description: Including pain rating scale, medication(s)/side effects and non-pharmacologic comfort measures Outcome: Progressing   Problem: Clinical Measurements: Goal: Ability to maintain clinical measurements within normal limits will improve Outcome: Progressing   Problem: Clinical Measurements: Goal: Will remain free from infection Outcome: Progressing   Problem: Clinical Measurements: Goal: Diagnostic test results will improve Outcome: Progressing   Problem: Clinical Measurements: Goal: Respiratory complications will improve Outcome: Progressing   Problem: Clinical Measurements: Goal: Cardiovascular complication will be avoided Outcome: Progressing   Problem: Safety: Goal: Ability to remain free from injury will improve Outcome: Progressing   Problem: Skin Integrity: Goal: Risk for impaired skin integrity will decrease Outcome: Progressing   Plan of care ongoing, see MAR see flowsheet

## 2023-08-20 NOTE — Plan of Care (Signed)

## 2023-08-21 DIAGNOSIS — Z7901 Long term (current) use of anticoagulants: Secondary | ICD-10-CM | POA: Diagnosis not present

## 2023-08-21 DIAGNOSIS — D472 Monoclonal gammopathy: Secondary | ICD-10-CM | POA: Diagnosis not present

## 2023-08-21 DIAGNOSIS — Z87891 Personal history of nicotine dependence: Secondary | ICD-10-CM | POA: Diagnosis not present

## 2023-08-21 DIAGNOSIS — E872 Acidosis, unspecified: Secondary | ICD-10-CM | POA: Diagnosis not present

## 2023-08-21 DIAGNOSIS — I2510 Atherosclerotic heart disease of native coronary artery without angina pectoris: Secondary | ICD-10-CM | POA: Diagnosis not present

## 2023-08-21 DIAGNOSIS — I252 Old myocardial infarction: Secondary | ICD-10-CM | POA: Diagnosis not present

## 2023-08-21 DIAGNOSIS — Z7982 Long term (current) use of aspirin: Secondary | ICD-10-CM | POA: Diagnosis not present

## 2023-08-21 DIAGNOSIS — I10 Essential (primary) hypertension: Secondary | ICD-10-CM | POA: Diagnosis not present

## 2023-08-21 DIAGNOSIS — K921 Melena: Secondary | ICD-10-CM | POA: Diagnosis not present

## 2023-08-21 DIAGNOSIS — K449 Diaphragmatic hernia without obstruction or gangrene: Secondary | ICD-10-CM | POA: Diagnosis not present

## 2023-08-21 DIAGNOSIS — D509 Iron deficiency anemia, unspecified: Secondary | ICD-10-CM | POA: Diagnosis not present

## 2023-08-21 DIAGNOSIS — N1832 Chronic kidney disease, stage 3b: Secondary | ICD-10-CM | POA: Diagnosis not present

## 2023-08-21 DIAGNOSIS — D519 Vitamin B12 deficiency anemia, unspecified: Secondary | ICD-10-CM | POA: Diagnosis not present

## 2023-08-21 DIAGNOSIS — Z8673 Personal history of transient ischemic attack (TIA), and cerebral infarction without residual deficits: Secondary | ICD-10-CM | POA: Diagnosis not present

## 2023-08-21 DIAGNOSIS — I82442 Acute embolism and thrombosis of left tibial vein: Secondary | ICD-10-CM | POA: Diagnosis not present

## 2023-08-21 DIAGNOSIS — Z888 Allergy status to other drugs, medicaments and biological substances status: Secondary | ICD-10-CM | POA: Diagnosis not present

## 2023-08-21 DIAGNOSIS — E785 Hyperlipidemia, unspecified: Secondary | ICD-10-CM | POA: Diagnosis not present

## 2023-08-21 DIAGNOSIS — Z86718 Personal history of other venous thrombosis and embolism: Secondary | ICD-10-CM | POA: Diagnosis not present

## 2023-08-21 DIAGNOSIS — K5521 Angiodysplasia of colon with hemorrhage: Secondary | ICD-10-CM | POA: Diagnosis not present

## 2023-08-21 DIAGNOSIS — N184 Chronic kidney disease, stage 4 (severe): Secondary | ICD-10-CM | POA: Diagnosis not present

## 2023-08-21 DIAGNOSIS — C9000 Multiple myeloma not having achieved remission: Secondary | ICD-10-CM | POA: Diagnosis not present

## 2023-08-21 DIAGNOSIS — D62 Acute posthemorrhagic anemia: Secondary | ICD-10-CM | POA: Diagnosis not present

## 2023-08-21 DIAGNOSIS — I129 Hypertensive chronic kidney disease with stage 1 through stage 4 chronic kidney disease, or unspecified chronic kidney disease: Secondary | ICD-10-CM | POA: Diagnosis not present

## 2023-08-21 DIAGNOSIS — E871 Hypo-osmolality and hyponatremia: Secondary | ICD-10-CM | POA: Diagnosis not present

## 2023-08-21 DIAGNOSIS — R809 Proteinuria, unspecified: Secondary | ICD-10-CM | POA: Diagnosis not present

## 2023-08-21 DIAGNOSIS — G2581 Restless legs syndrome: Secondary | ICD-10-CM | POA: Diagnosis not present

## 2023-08-21 DIAGNOSIS — K922 Gastrointestinal hemorrhage, unspecified: Secondary | ICD-10-CM | POA: Diagnosis not present

## 2023-08-21 DIAGNOSIS — K219 Gastro-esophageal reflux disease without esophagitis: Secondary | ICD-10-CM | POA: Diagnosis not present

## 2023-08-21 DIAGNOSIS — Z8551 Personal history of malignant neoplasm of bladder: Secondary | ICD-10-CM | POA: Diagnosis not present

## 2023-08-21 DIAGNOSIS — Z79899 Other long term (current) drug therapy: Secondary | ICD-10-CM | POA: Diagnosis not present

## 2023-08-21 DIAGNOSIS — D649 Anemia, unspecified: Secondary | ICD-10-CM | POA: Diagnosis present

## 2023-08-21 DIAGNOSIS — K5730 Diverticulosis of large intestine without perforation or abscess without bleeding: Secondary | ICD-10-CM | POA: Diagnosis not present

## 2023-08-21 DIAGNOSIS — Z7983 Long term (current) use of bisphosphonates: Secondary | ICD-10-CM | POA: Diagnosis not present

## 2023-08-21 DIAGNOSIS — Z880 Allergy status to penicillin: Secondary | ICD-10-CM | POA: Diagnosis not present

## 2023-08-21 LAB — HEMOGLOBIN: Hemoglobin: 8.4 g/dL — ABNORMAL LOW (ref 12.0–15.0)

## 2023-08-21 MED ORDER — PANTOPRAZOLE SODIUM 40 MG IV SOLR: 40.0000 mg | Freq: Two times a day (BID) | INTRAVENOUS | Status: DC

## 2023-08-21 MED ORDER — PANTOPRAZOLE SODIUM 40 MG PO TBEC: 40.0000 mg | DELAYED_RELEASE_TABLET | Freq: Two times a day (BID) | ORAL | Status: DC

## 2023-08-21 NOTE — Plan of Care (Signed)
  Problem: Education: Goal: Knowledge of General Education information will improve Description: Including pain rating scale, medication(s)/side effects and non-pharmacologic comfort measures Outcome: Progressing   Problem: Clinical Measurements: Goal: Ability to maintain clinical measurements within normal limits will improve Outcome: Progressing   Problem: Clinical Measurements: Goal: Will remain free from infection Outcome: Progressing   Problem: Coping: Goal: Level of anxiety will decrease Outcome: Progressing   Problem: Safety: Goal: Ability to remain free from injury will improve Outcome: Progressing   Plan of  care ongoing, see ,MAR see flowsheet
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
## 2023-08-22 ENCOUNTER — Encounter (INDEPENDENT_AMBULATORY_CARE_PROVIDER_SITE_OTHER): Admitting: Nurse Practitioner

## 2023-08-23 ENCOUNTER — Encounter (INDEPENDENT_AMBULATORY_CARE_PROVIDER_SITE_OTHER): Payer: Self-pay

## 2023-08-23 IMAGING — MG MM DIGITAL SCREENING BILAT W/ TOMO AND CAD
8 series · 8 of 24 positions shown · non-contrast
Comparison: Previous exam(s).

CLINICAL DATA: Screening.

EXAM:
DIGITAL SCREENING BILATERAL MAMMOGRAM WITH TOMOSYNTHESIS AND CAD
TECHNIQUE: Bilateral screening digital craniocaudal and mediolateral oblique
mammograms were obtained. Bilateral screening digital breast
tomosynthesis was performed. The images were evaluated with
computer-aided detection.

[L CC synth-2D]
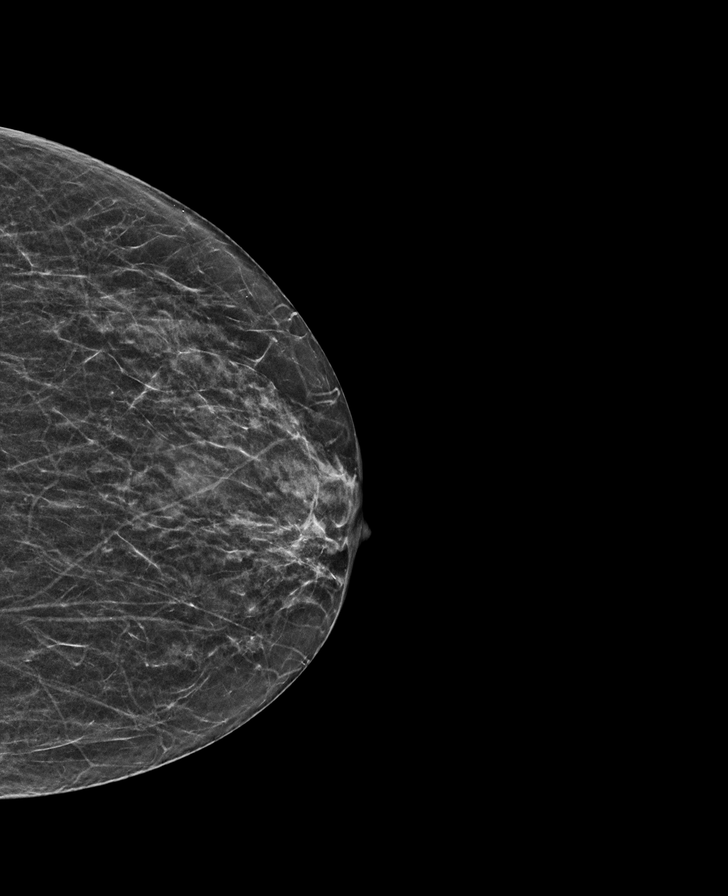

[R MLO synth-2D]
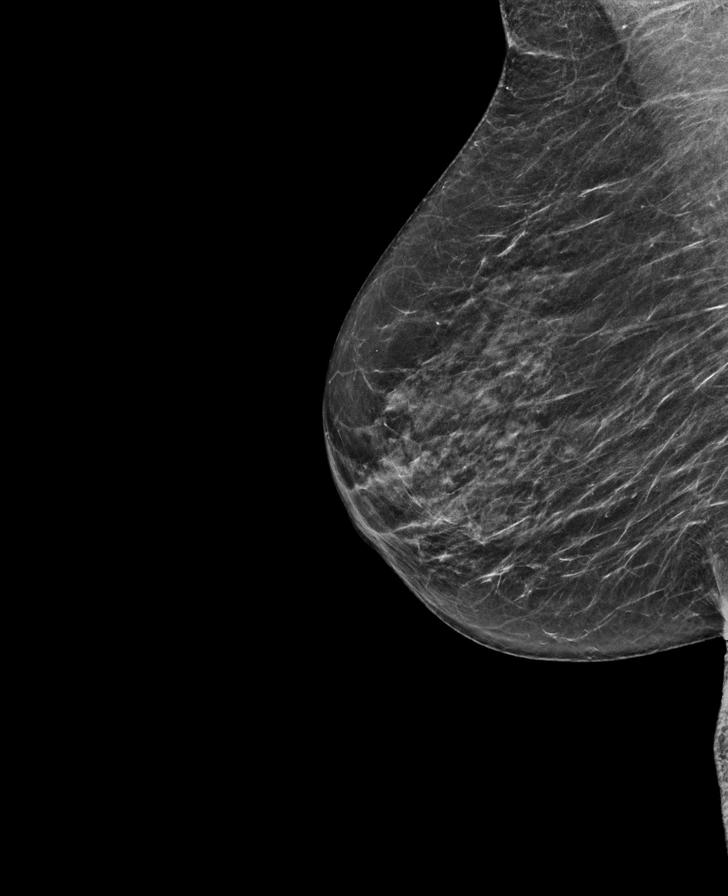

[R CC synth-2D]
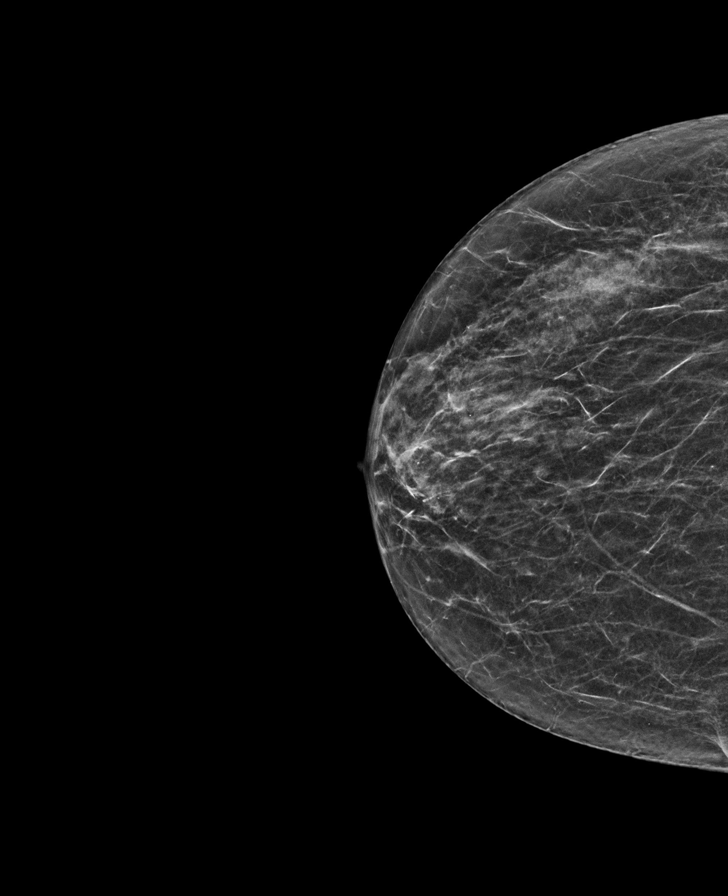

[L MLO synth-2D]
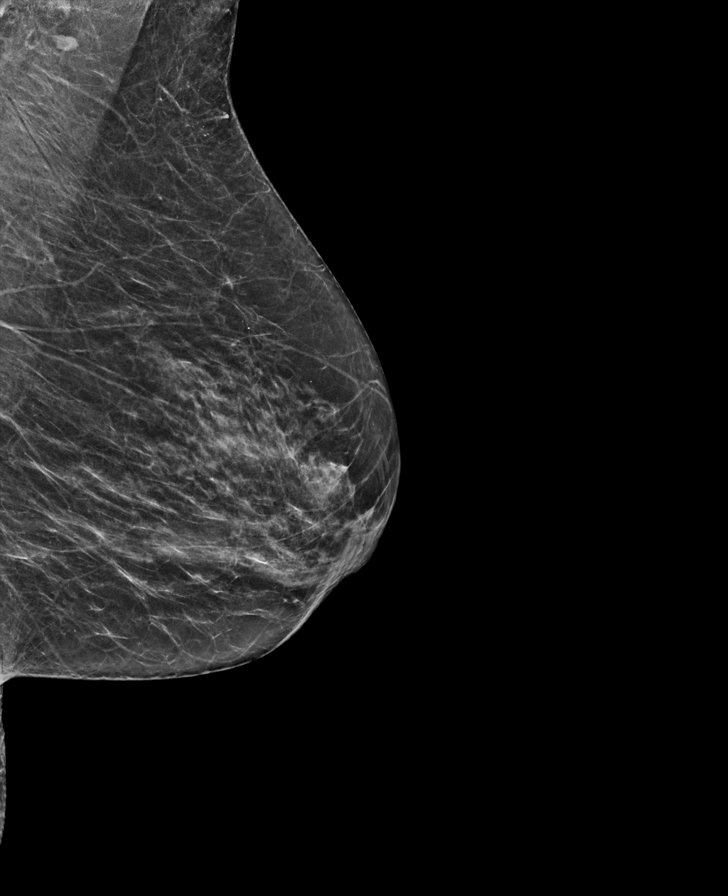

[R MLO tomo · tomo slice 29/57.0]
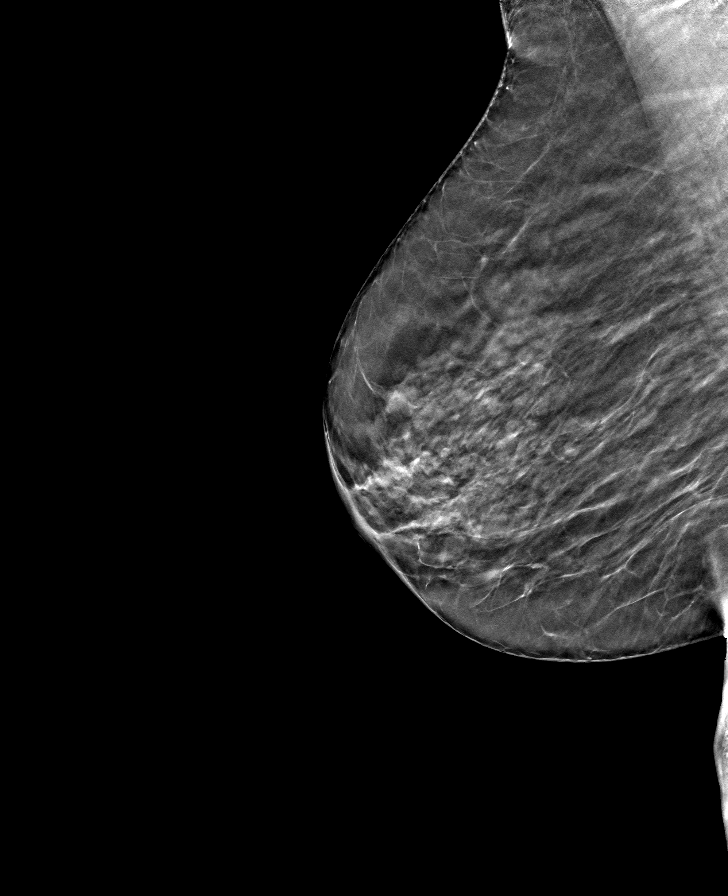

[L CC tomo · tomo slice 21/40.0]
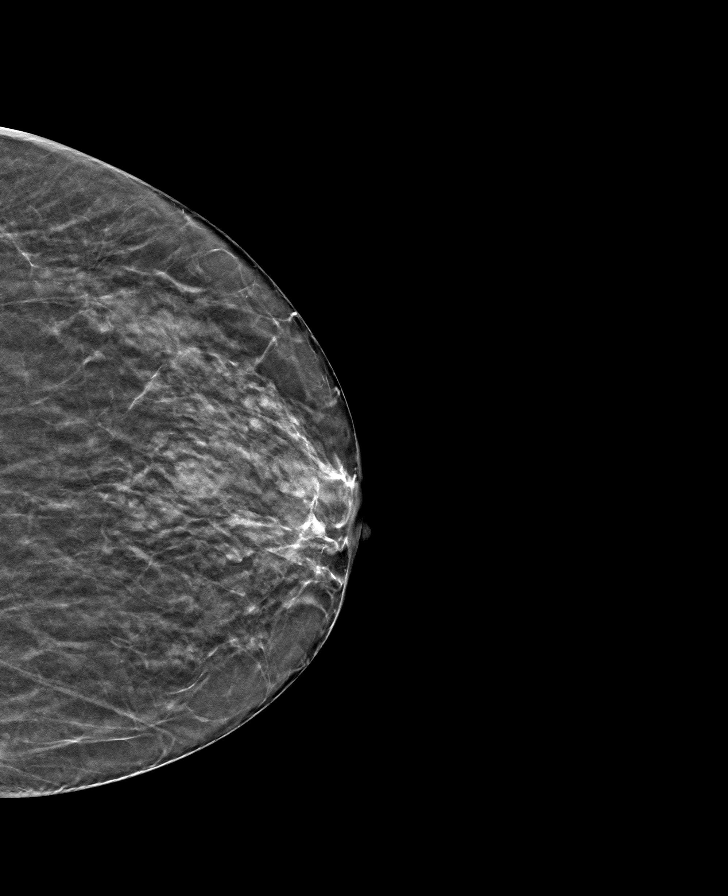

[L MLO tomo · tomo slice 29/56.0]
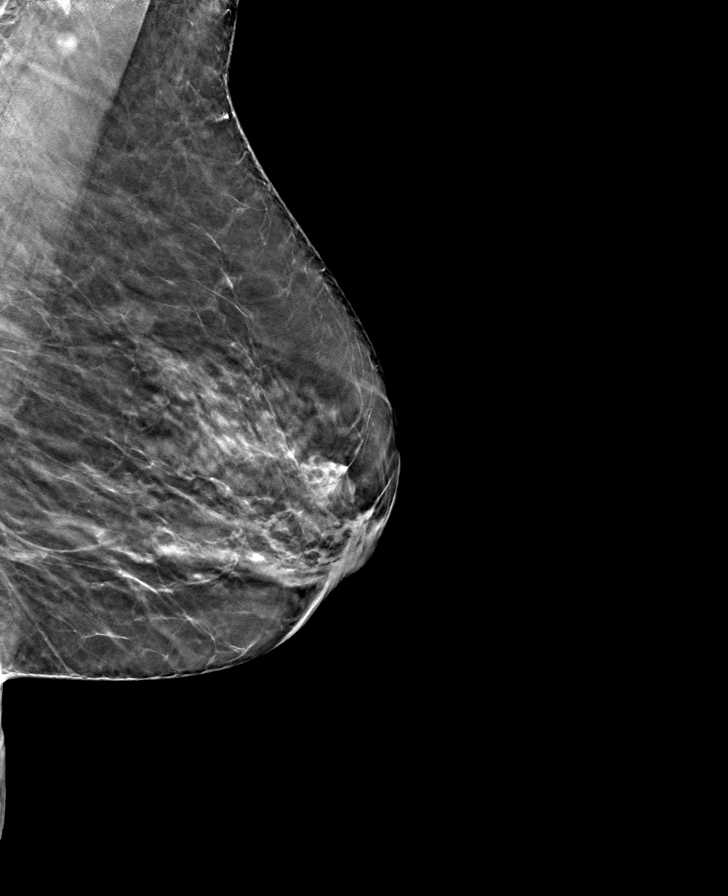

[R CC tomo · tomo slice 23/46.0]
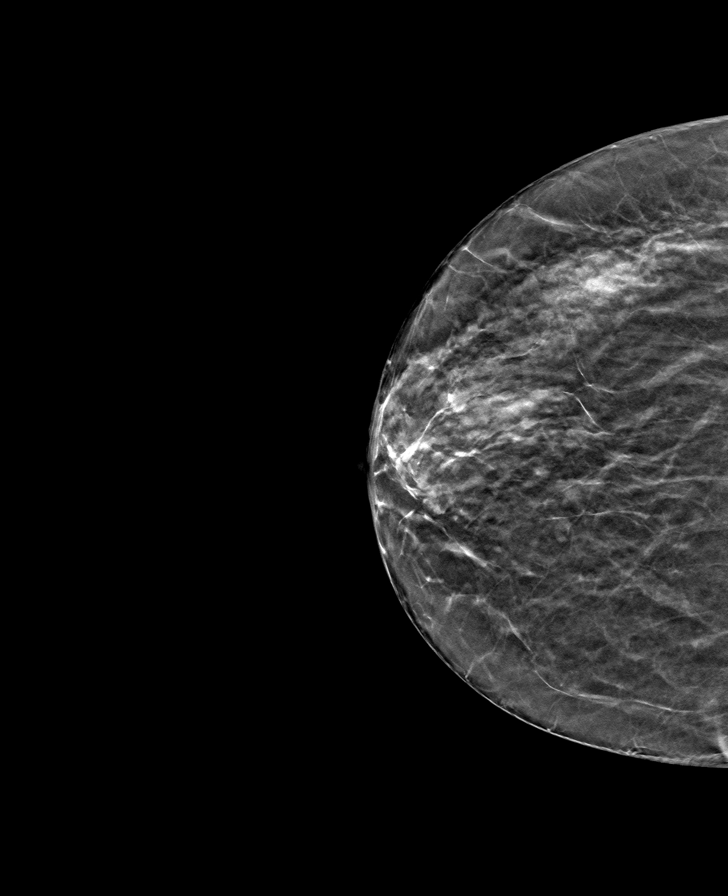

[8 of 24 positions shown; findings below may reference images not displayed]

ACR Breast Density Category b: There are scattered areas of
fibroglandular density.
FINDINGS: There are no findings suspicious for malignancy.
IMPRESSION: No mammographic evidence of malignancy. A result letter of this
screening mammogram will be mailed directly to the patient.

RECOMMENDATION:
Screening mammogram in one year. (Code:51-O-LD2)

BI-RADS CATEGORY  1: Negative.

## 2023-08-25 ENCOUNTER — Encounter: Payer: Self-pay | Admitting: Family Medicine

## 2023-08-26 ENCOUNTER — Other Ambulatory Visit: Payer: Self-pay | Admitting: Family Medicine

## 2023-08-26 DIAGNOSIS — I82442 Acute embolism and thrombosis of left tibial vein: Secondary | ICD-10-CM

## 2023-08-30 DIAGNOSIS — R1013 Epigastric pain: Secondary | ICD-10-CM | POA: Diagnosis not present

## 2023-08-30 DIAGNOSIS — D649 Anemia, unspecified: Secondary | ICD-10-CM | POA: Diagnosis not present

## 2023-08-30 DIAGNOSIS — Z09 Encounter for follow-up examination after completed treatment for conditions other than malignant neoplasm: Secondary | ICD-10-CM | POA: Diagnosis not present

## 2023-08-30 DIAGNOSIS — D500 Iron deficiency anemia secondary to blood loss (chronic): Secondary | ICD-10-CM | POA: Diagnosis not present

## 2023-08-30 DIAGNOSIS — K922 Gastrointestinal hemorrhage, unspecified: Secondary | ICD-10-CM | POA: Diagnosis not present

## 2023-08-30 DIAGNOSIS — G4700 Insomnia, unspecified: Secondary | ICD-10-CM | POA: Diagnosis not present

## 2023-08-30 DIAGNOSIS — I82409 Acute embolism and thrombosis of unspecified deep veins of unspecified lower extremity: Secondary | ICD-10-CM | POA: Diagnosis not present

## 2023-09-01 DIAGNOSIS — R748 Abnormal levels of other serum enzymes: Secondary | ICD-10-CM | POA: Diagnosis not present

## 2023-09-06 ENCOUNTER — Other Ambulatory Visit (INDEPENDENT_AMBULATORY_CARE_PROVIDER_SITE_OTHER)

## 2023-09-06 ENCOUNTER — Ambulatory Visit (INDEPENDENT_AMBULATORY_CARE_PROVIDER_SITE_OTHER): Admitting: Vascular Surgery

## 2023-09-06 ENCOUNTER — Other Ambulatory Visit (INDEPENDENT_AMBULATORY_CARE_PROVIDER_SITE_OTHER): Payer: Self-pay | Admitting: Vascular Surgery

## 2023-09-06 VITALS — BP 153/68 | HR 81 | Ht 60.0 in | Wt 163.0 lb

## 2023-09-06 DIAGNOSIS — E785 Hyperlipidemia, unspecified: Secondary | ICD-10-CM

## 2023-09-06 DIAGNOSIS — M79605 Pain in left leg: Secondary | ICD-10-CM | POA: Diagnosis not present

## 2023-09-06 DIAGNOSIS — I6523 Occlusion and stenosis of bilateral carotid arteries: Secondary | ICD-10-CM | POA: Diagnosis not present

## 2023-09-06 DIAGNOSIS — R42 Dizziness and giddiness: Secondary | ICD-10-CM

## 2023-09-06 DIAGNOSIS — I82442 Acute embolism and thrombosis of left tibial vein: Secondary | ICD-10-CM

## 2023-09-06 DIAGNOSIS — I10 Essential (primary) hypertension: Secondary | ICD-10-CM | POA: Diagnosis not present

## 2023-09-06 DIAGNOSIS — M5416 Radiculopathy, lumbar region: Secondary | ICD-10-CM | POA: Diagnosis not present

## 2023-09-06 DIAGNOSIS — M47816 Spondylosis without myelopathy or radiculopathy, lumbar region: Secondary | ICD-10-CM | POA: Diagnosis not present

## 2023-09-06 NOTE — Assessment & Plan Note (Signed)
 lipid control important in reducing the progression of atherosclerotic disease. Continue statin therapy

## 2023-09-06 NOTE — Progress Notes (Signed)
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 Range   ABO/RH(D) AB POS    Antibody Screen NEG    Sample Expiration 08/21/2023,2359    Unit Number [id number]    Blood Component Type RED CELLS,LR    Unit division 00    Status of Unit ISSUED,FINAL    Transfusion Status OK TO TRANSFUSE    Crossmatch Result Compatible    Unit Number [id number]    Blood Component Type RED CELLS,LR    Unit division 00    Status of Unit ISSUED,FINAL    Transfusion Status OK TO TRANSFUSE    Crossmatch Result      Compatible Performed at [facility], [address] [place], [place] [address]   Reticulocytes     Status: Abnormal   Collection Time: 08/18/23  9:33 AM  Result Value Ref Range   Retic Ct Pct 5.0 (H) 0.4 - 3.1 %   RBC. 1.92 (L) 3.87 - 5.11 MIL/uL   Retic Count, Absolute 96.2 19.0 - 186.0 K/uL   Immature Retic Fract 34.7 (H) 2.3 - 15.9 %    Comment: Performed at [facility],  [address] [place], [place] [address]  BPAM RBC     Status: None   Collection Time: 08/18/23  9:33 AM  Result Value Ref Range   ISSUE DATE / TIME 956387564332    Blood Product Unit Number [id number]    PRODUCT CODE [id number]    Unit Type and Rh 6200    Blood Product Expiration Date 355732202542    ISSUE DATE / TIME 706237628315    Blood Product Unit Number [id number]    PRODUCT CODE [id number]    Unit Type and Rh 6200    Blood Product Expiration Date [id number]   Prepare RBC (crossmatch)     Status: None   Collection Time: 08/18/23 10:04 AM  Result Value Ref Range   Order Confirmation  ORDER PROCESSED BY BLOOD BANK Performed at [facility], [address] [place], [place] [address]   Lactate dehydrogenase     Status: None   Collection Time: 08/18/23 10:08 AM  Result Value Ref Range   LDH 100 98 - 192 U/L    Comment: Performed at [facility], [address] [place], [place] [address]  Haptoglobin     Status: None   Collection Time: 08/18/23 10:08 AM  Result Value Ref Range   Haptoglobin 129 41 - 333 mg/dL    Comment: (NOTE) Performed At: [facility] [address] [place], [place] [id number] Pearlean Botts MD UU:[id number]   Fibrinogen      Status: None   Collection Time: 08/18/23 10:08 AM  Result Value Ref Range   Fibrinogen  467 210 - 475 mg/dL    Comment: (NOTE) Fibrinogen  results may be underestimated in patients receiving thrombolytic therapy. Performed at [facility], [address] [place], [place] [address]   D-dimer, quantitative     Status: Abnormal   Collection Time: 08/18/23 10:08 AM  Result Value Ref Range   D-Dimer, Quant 1.03 (H) 0.00 - 0.50 ug/mL-FEU    Comment: (NOTE) At the manufacturer cut-off value of 0.5 g/mL FEU, this assay has a negative predictive value of 95-100%.This assay is intended for use in conjunction with a clinical pretest probability (PTP) assessment model to exclude  pulmonary embolism (PE) and deep venous thrombosis (DVT) in outpatients suspected of PE or DVT. Results should be correlated with clinical presentation. Performed at [facility], [address] [place], [place] [address]   Vitamin B12     Status: None   Collection Time: 08/18/23 10:08 AM  Result Value Ref Range   Vitamin B-12 272 180 - 914 pg/mL    Comment: (NOTE) This assay is not validated for testing neonatal or myeloproliferative syndrome specimens for Vitamin B12 levels. Performed at [facility], [address], [place], [place] [address]   Folate     Status: None   Collection Time: 08/18/23 10:08 AM  Result Value Ref Range   Folate 13.7 >5.9 ng/mL    Comment: Performed at [facility], [address] [place], [place] [address]  Iron  and TIBC     Status: Abnormal   Collection Time: 08/18/23 10:08 AM  Result Value Ref Range   Iron  39 28 - 170 ug/dL   TIBC 884 166 - 063 ug/dL   Saturation Ratios 9 (L) 10.4 - 31.8 %   UIBC 410 ug/dL    Comment: Performed at [facility], [address] [place], [place] [address]  Ferritin     Status: None   Collection Time: 08/18/23 10:08 AM  Result Value Ref Range   Ferritin 18 11 - 307 ng/mL    Comment: Performed at [facility], [address] [place], [place] [address]  ABO/Rh     Status: None   Collection Time: 08/18/23 10:35 AM  Result Value Ref Range   ABO/RH(D)      AB POS Performed at [facility], [address] [place], [place] [address]   Basic metabolic panel     Status: Abnormal   Collection Time: 08/19/23  5:48 AM  Result Value Ref Range   Sodium 135 135 - 145 mmol/L   Potassium 4.7 3.5 - 5.1 mmol/L   Chloride 108 98 - 111 mmol/L   CO2 20 (L) 22 - 32 mmol/L   Glucose, Bld 90 70 -  99 mg/dL    Comment: Glucose reference range applies only to samples taken after fasting for at least 8 hours.   BUN 27 (H) 8 - 23 mg/dL   Creatinine, Ser 4.09 (H) 0.44 - 1.00  mg/dL   Calcium  8.3 (L) 8.9 - 10.3 mg/dL   GFR, Estimated 43 (L) >60 mL/min    Comment: (NOTE) Calculated using the CKD-EPI Creatinine Equation (2021)    Anion gap 7 5 - 15    Comment: Performed at [facility], [address] [place], [place] [address]  CBC     Status: Abnormal   Collection Time: 08/19/23  5:48 AM  Result Value Ref Range   WBC 3.6 (L) 4.0 - 10.5 K/uL   RBC 2.18 (L) 3.87 - 5.11 MIL/uL   Hemoglobin 6.8 (L) 12.0 - 15.0 g/dL   HCT 47.8 (L) 29.5 - 62.1 %   MCV 95.9 80.0 - 100.0 fL   MCH 31.2 26.0 - 34.0 pg   MCHC 32.5 30.0 - 36.0 g/dL   RDW 30.8 65.7 - 84.6 %   Platelets 203 150 - 400 K/uL   nRBC 0.0 0.0 - 0.2 %    Comment: Performed at [facility], [address] [place], [place] [address]  Prepare RBC (crossmatch)     Status: None   Collection Time: 08/19/23  1:00 PM  Result Value Ref Range   Order Confirmation      ORDER PROCESSED BY BLOOD BANK Performed at [facility], [address] [place], [place] [address]   Hemoglobin     Status: Abnormal   Collection Time: 08/19/23  6:20 PM  Result Value Ref Range   Hemoglobin 8.3 (L) 12.0 - 15.0 g/dL    Comment: Performed at [facility], [address] [place], [place] [address]  Hemoglobin     Status: Abnormal   Collection Time: 08/20/23 12:09 AM  Result Value Ref Range   Hemoglobin 8.1 (L) 12.0 - 15.0 g/dL    Comment: Performed at [facility], [address], [place], [place] [address]  Basic metabolic panel with GFR     Status: Abnormal   Collection Time: 08/20/23  5:28 AM  Result Value Ref Range   Sodium 133 (L) 135 - 145 mmol/L   Potassium 4.5 3.5 - 5.1 mmol/L   Chloride 107 98 - 111 mmol/L   CO2 20 (L) 22 - 32 mmol/L   Glucose, Bld 91 70 - 99 mg/dL    Comment: Glucose reference range applies only to samples taken after fasting for at least 8 hours.   BUN 25 (H) 8 - 23 mg/dL   Creatinine, Ser 3.66 (H) 0.44 - 1.00 mg/dL   Calcium  8.2 (L) 8.9 - 10.3  mg/dL   GFR, Estimated 38 (L) >60 mL/min    Comment: (NOTE) Calculated using the CKD-EPI Creatinine Equation (2021)    Anion gap 6 5 - 15    Comment: Performed at [facility], [address] [place], [place] [address]  Magnesium     Status: None   Collection Time: 08/20/23  5:28 AM  Result Value Ref Range   Magnesium 1.9 1.7 - 2.4 mg/dL    Comment: Performed at [facility], [address] [place], [place] [address]  Hemoglobin     Status: Abnormal   Collection Time: 08/20/23  5:28 AM  Result Value Ref Range   Hemoglobin 8.7 (L) 12.0 - 15.0 g/dL    Comment: Performed at [facility], [address] [place], [place] [address]  Hemoglobin     Status: Abnormal   Collection Time: 08/20/23 12:34 PM  Result Value Ref Range   Hemoglobin 8.6 (L) 12.0 - 15.0 g/dL    Comment: Performed at [facility], [address] [place], [place] [address]  Hemoglobin     Status: Abnormal   Collection Time: 08/20/23  6:18 PM  Result Value Ref Range   Hemoglobin 9.1 (L) 12.0 - 15.0 g/dL    Comment: Performed at [facility], [address] [place], [place] [address]  Hemoglobin     Status: Abnormal   Collection Time: 08/21/23  5:34 AM  Result Value Ref Range   Hemoglobin 8.4 (L) 12.0 - 15.0 g/dL    Comment: Performed at [facility], [address] [place], [place] [address]    Assessment/Plan:  Hyperlipidemia lipid control important in reducing the progression of atherosclerotic disease. Continue statin therapy   Acute deep vein thrombosis (DVT) of tibial vein of left lower extremity (HCC) A repeat duplex was performed today showing resolution of her previous DVT with no significant DVT or superficial thrombophlebitis identified on today's study. I am ok with her being off of the Eliquis at this point. Recommend 81 mg ASA daily for three months. Follow up PRN  Hypertension blood pressure control important in reducing the  progression of atherosclerotic disease. On appropriate oral medications.     Dizziness Discussed that this is unlikely for moderate carotid disease which is unilateral and only mild disease on the left. Discussed other possibilities including cardiac disease or blood pressure irregularities.    Mikki Alexander 09/06/2023, 4:01 PM   This note was created with Dragon medical transcription system.  Any errors from dictation are unintentional.

## 2023-09-06 NOTE — Assessment & Plan Note (Signed)
 A repeat duplex was performed today showing resolution of her previous DVT with no significant DVT or superficial thrombophlebitis identified on today's study. I am ok with her being off of the Eliquis at this point. Recommend 81 mg ASA daily for three months. Follow up PRN

## 2023-09-08 ENCOUNTER — Ambulatory Visit

## 2023-09-12 DIAGNOSIS — I10 Essential (primary) hypertension: Secondary | ICD-10-CM | POA: Diagnosis not present

## 2023-09-14 DIAGNOSIS — E538 Deficiency of other specified B group vitamins: Secondary | ICD-10-CM | POA: Diagnosis not present

## 2023-09-14 DIAGNOSIS — Z86718 Personal history of other venous thrombosis and embolism: Secondary | ICD-10-CM | POA: Diagnosis not present

## 2023-09-14 DIAGNOSIS — M791 Myalgia, unspecified site: Secondary | ICD-10-CM | POA: Diagnosis not present

## 2023-09-14 DIAGNOSIS — Z8719 Personal history of other diseases of the digestive system: Secondary | ICD-10-CM | POA: Diagnosis not present

## 2023-09-14 DIAGNOSIS — M810 Age-related osteoporosis without current pathological fracture: Secondary | ICD-10-CM | POA: Diagnosis not present

## 2023-09-19 DIAGNOSIS — I2089 Other forms of angina pectoris: Secondary | ICD-10-CM | POA: Diagnosis not present

## 2023-09-19 DIAGNOSIS — I2510 Atherosclerotic heart disease of native coronary artery without angina pectoris: Secondary | ICD-10-CM | POA: Diagnosis not present

## 2023-09-19 DIAGNOSIS — C9000 Multiple myeloma not having achieved remission: Secondary | ICD-10-CM | POA: Diagnosis not present

## 2023-09-19 DIAGNOSIS — K219 Gastro-esophageal reflux disease without esophagitis: Secondary | ICD-10-CM | POA: Diagnosis not present

## 2023-09-19 DIAGNOSIS — N184 Chronic kidney disease, stage 4 (severe): Secondary | ICD-10-CM | POA: Diagnosis not present

## 2023-09-19 DIAGNOSIS — E785 Hyperlipidemia, unspecified: Secondary | ICD-10-CM | POA: Diagnosis not present

## 2023-09-19 DIAGNOSIS — R079 Chest pain, unspecified: Secondary | ICD-10-CM | POA: Diagnosis not present

## 2023-09-19 DIAGNOSIS — I10 Essential (primary) hypertension: Secondary | ICD-10-CM | POA: Diagnosis not present

## 2023-09-28 DIAGNOSIS — E538 Deficiency of other specified B group vitamins: Secondary | ICD-10-CM | POA: Diagnosis not present

## 2023-10-13 DIAGNOSIS — E538 Deficiency of other specified B group vitamins: Secondary | ICD-10-CM | POA: Diagnosis not present

## 2023-10-26 DIAGNOSIS — E538 Deficiency of other specified B group vitamins: Secondary | ICD-10-CM | POA: Diagnosis not present

## 2023-11-08 DIAGNOSIS — E538 Deficiency of other specified B group vitamins: Secondary | ICD-10-CM | POA: Diagnosis not present

## 2023-11-22 DIAGNOSIS — I2510 Atherosclerotic heart disease of native coronary artery without angina pectoris: Secondary | ICD-10-CM | POA: Diagnosis not present

## 2023-11-22 DIAGNOSIS — N184 Chronic kidney disease, stage 4 (severe): Secondary | ICD-10-CM | POA: Diagnosis not present

## 2023-11-22 DIAGNOSIS — E538 Deficiency of other specified B group vitamins: Secondary | ICD-10-CM | POA: Diagnosis not present

## 2023-11-22 DIAGNOSIS — D631 Anemia in chronic kidney disease: Secondary | ICD-10-CM | POA: Diagnosis not present

## 2023-11-29 DIAGNOSIS — D649 Anemia, unspecified: Secondary | ICD-10-CM | POA: Diagnosis not present

## 2023-11-29 DIAGNOSIS — Z0000 Encounter for general adult medical examination without abnormal findings: Secondary | ICD-10-CM | POA: Diagnosis not present

## 2023-11-29 DIAGNOSIS — E538 Deficiency of other specified B group vitamins: Secondary | ICD-10-CM | POA: Diagnosis not present

## 2023-12-06 DIAGNOSIS — I2510 Atherosclerotic heart disease of native coronary artery without angina pectoris: Secondary | ICD-10-CM | POA: Diagnosis not present

## 2023-12-06 DIAGNOSIS — K219 Gastro-esophageal reflux disease without esophagitis: Secondary | ICD-10-CM | POA: Diagnosis not present

## 2023-12-06 DIAGNOSIS — I219 Acute myocardial infarction, unspecified: Secondary | ICD-10-CM | POA: Diagnosis not present

## 2023-12-06 DIAGNOSIS — E785 Hyperlipidemia, unspecified: Secondary | ICD-10-CM | POA: Diagnosis not present

## 2023-12-06 DIAGNOSIS — Z86718 Personal history of other venous thrombosis and embolism: Secondary | ICD-10-CM | POA: Diagnosis not present

## 2023-12-06 DIAGNOSIS — R0789 Other chest pain: Secondary | ICD-10-CM | POA: Diagnosis not present

## 2023-12-06 DIAGNOSIS — I2089 Other forms of angina pectoris: Secondary | ICD-10-CM | POA: Diagnosis not present

## 2023-12-06 DIAGNOSIS — C9000 Multiple myeloma not having achieved remission: Secondary | ICD-10-CM | POA: Diagnosis not present

## 2023-12-06 DIAGNOSIS — I10 Essential (primary) hypertension: Secondary | ICD-10-CM | POA: Diagnosis not present

## 2023-12-06 DIAGNOSIS — Z8551 Personal history of malignant neoplasm of bladder: Secondary | ICD-10-CM | POA: Diagnosis not present

## 2023-12-08 DIAGNOSIS — R197 Diarrhea, unspecified: Secondary | ICD-10-CM | POA: Diagnosis not present

## 2023-12-16 DIAGNOSIS — R1084 Generalized abdominal pain: Secondary | ICD-10-CM | POA: Diagnosis not present

## 2023-12-16 DIAGNOSIS — R197 Diarrhea, unspecified: Secondary | ICD-10-CM | POA: Diagnosis not present

## 2023-12-21 ENCOUNTER — Encounter: Payer: Self-pay | Admitting: Family Medicine

## 2023-12-21 ENCOUNTER — Other Ambulatory Visit: Payer: Self-pay | Admitting: Family Medicine

## 2023-12-21 DIAGNOSIS — R197 Diarrhea, unspecified: Secondary | ICD-10-CM

## 2023-12-27 ENCOUNTER — Ambulatory Visit
Admission: RE | Admit: 2023-12-27 | Discharge: 2023-12-27 | Disposition: A | Discharge status: Home / Self Care | Source: Ambulatory Visit | Attending: Family Medicine | Admitting: Family Medicine

## 2023-12-27 DIAGNOSIS — R197 Diarrhea, unspecified: Secondary | ICD-10-CM | POA: Insufficient documentation

## 2023-12-27 DIAGNOSIS — K5730 Diverticulosis of large intestine without perforation or abscess without bleeding: Secondary | ICD-10-CM | POA: Diagnosis not present

## 2024-01-07 NOTE — Progress Notes (Signed)
 Established Patient Visit   Chief Complaint:No chief complaint on file.  Date of Service: 01/04/2024 Date of Birth: February 27, 1935 PCP: Alla Amis, MD  History of Present Illness: Julie Khan is a 89 y.o.female patient who patient history of palpitation tachycardia history of DVT and anemia not on moderate coronary disease GERD previous myocardial infarction.  Patient denies any recurrent chest pain or anginal symptoms states she feels reasonably well no lightheaded or dizziness currently no shortness of breath fatigue is somewhat improved  Past Medical and Surgical History  Past Medical History Past Medical History:  Diagnosis Date  . Arthritis   . Bladder cancer (CMS/HHS-HCC)   . Cancer (CMS/HHS-HCC)   . Chronic kidney disease   . Hyperlipidemia   . Hypertension   . Multiple myeloma (CMS/HHS-HCC)   . Tubular adenoma of colon 07/25/2015    Past Surgical History She has a past surgical history that includes Hernia repair; Cystectomy; Colonoscopy (> 10 yrs ago); Colonoscopy (07/25/2015); egd (07/25/2015); Bladder Cancer Surgery (2000); Appendectomy (2021); Colonoscopy (07/14/2022); small intestine endoscopy w/control bleeding (N/A, 08/22/2023); and unlisted procedure intestine (N/A, 08/22/2023).   Medications and Allergies  Current Medications  Current Outpatient Medications  Medication Sig Dispense Refill  . acetaminophen  (TYLENOL ) 500 MG tablet Take 1,000 mg by mouth every 8 (eight) hours as needed    . calcium  carbonate 600 mg calcium  (1,500 mg) Tab tablet Take 1,200 mg by mouth once daily    . cholecalciferol (VITAMIN D3) 1000 unit tablet Take 1 tablet by mouth once daily    . co-enzyme Q-10, ubiquinone, 200 mg capsule Take 1 capsule (200 mg total) by mouth once daily    . cyanocobalamin  (VITAMIN B12) 1000 MCG tablet Take 1 tablet (1,000 mcg total) by mouth once daily    . meclizine  (ANTIVERT ) 25 mg tablet Take 25 mg by mouth 2 (two) times daily as needed for Dizziness    .  melatonin 10 mg Tab Take 1 tablet by mouth at bedtime    . metoprolol  SUCCinate (TOPROL -XL) 50 MG XL tablet Take 50 mg by mouth once daily    . nitroGLYcerin  (NITROSTAT ) 0.4 MG SL tablet Place 0.4 mg under the tongue every 5 (five) minutes as needed for Chest pain May take up to 3 doses.    . pantoprazole  (PROTONIX ) 40 MG DR tablet TAKE 1 TABLET(40 MG) BY MOUTH EVERY DAY 100 tablet 1  . PREMARIN  0.625 mg/gram vaginal cream Apply one pea-sized amount around the opening of the urethra daily for 2 weeks, then 3 times weekly moving forward.    . rOPINIRole  (REQUIP ) 0.5 MG immediate release tablet TAKE 1 AND 1/2 TABLETS(0.75 MG) BY MOUTH AT BEDTIME (Patient taking differently: Take 0.5 mg by mouth at bedtime) 135 tablet 2  . rosuvastatin  (CRESTOR ) 10 MG tablet TAKE 1 TABLET(10 MG) BY MOUTH DAILY 100 tablet 1  . alendronate (FOSAMAX) 70 MG tablet Take 1 tablet (70 mg total) by mouth every 7 (seven) days Take with a full glass of water. Do not lie down for the next 30 min. (Patient not taking: Reported on 01/04/2024) 14 tablet 1  . isosorbide  mononitrate (IMDUR) 30 MG ER tablet Take 1 tablet (30 mg total) by mouth once daily (Patient not taking: Reported on 01/04/2024) 30 tablet 11  . predniSONE (DELTASONE) 10 MG tablet 4 tabs po daily x 3 days, 3 tabs po daily x 3 days, 2 tabs po daily x 3 days, 1 tab po daily x 3 days (Patient not taking: Reported on 01/04/2024)  30 tablet 0   No current facility-administered medications for this visit.    Allergies: Sucralfate , Ace inhibitors, Iodine , and Penicillins  Social and Family History  Social History  reports that she has never smoked. She has never been exposed to tobacco smoke. She has never used smokeless tobacco. She reports that she does not drink alcohol and does not use drugs.  Family History Family History  Problem Relation Name Age of Onset  . High blood pressure (Hypertension) Mother    . Cancer Mother    . Colon cancer Mother    . Colon polyps  Mother    . Cancer Sister    . Breast cancer Sister    . Urolithiasis Brother    . Irritable bowel syndrome Neg Hx      Review of Systems   Review of Systems: The patient denies chest pain, shortness of breath, orthopnea, paroxysmal nocturnal dyspnea, pedal edema, palpitations, heart racing, presyncope, syncope. Review of 12 Systems is negative except as described above.  Physical Examination   Vitals:BP 104/70 (BP Location: Left upper arm, Patient Position: Sitting, BP Cuff Size: Adult)   Pulse 77   Resp 12   Ht 152.4 cm (5')   Wt 71.4 kg (157 lb 6.4 oz)   LMP  (LMP Unknown)   SpO2 99%   BMI 30.74 kg/m  Ht:152.4 cm (5') Wt:71.4 kg (157 lb 6.4 oz) ADJ:Anib surface area is 1.74 meters squared. Body mass index is 30.74 kg/m.  HEENT: Pupils equally reactive to light and accomodation  Neck: Supple without thyromegaly, carotid pulses 2+ Lungs: clear to auscultation bilaterally; no wheezes, rales, rhonchi Heart: Regular rate and rhythm.  No gallops, murmurs or rub Abdomen: soft nontender, nondistended, with normal bowel sounds Extremities: no cyanosis, clubbing, or edema Peripheral Pulses: 2+ in all extremities, 2+ femoral pulses bilaterally Neurologic: Alert and oriented X3; speech intact; face symmetrical; moves all extremities well  Assessment   89 y.o. female with  1. RLS (restless legs syndrome)   2. History of DVT (deep vein thrombosis) 06/28/23  s/p eliquis s/p evaluation w/ Dr. Marea   3. Myocardial infarction, unspecified MI type, unspecified artery (CMS/HHS-HCC)   4. Essential hypertension   5. Hyperlipidemia, unspecified hyperlipidemia type   6. Angina at rest ()   7. Multiple myeloma, remission status unspecified (CMS/HHS-HCC)   8. Gastroesophageal reflux disease without esophagitis   9. CKD (chronic kidney disease) stage 4, Cr 1.7, GFR 29 - 05/25/22 - followed by nephrology   10. Coronary artery disease involving native coronary artery of native heart without angina  pectoris (50% LAD; cath 2021; LDL 46 - 05/25/22) - followed by Dr. Fernand   11. Acute deep vein thrombosis (DVT) of distal vein of left lower extremity (CMS/HHS-HCC)   12. Coronary artery disease, unspecified vessel or lesion type, unspecified whether angina present, unspecified whether native or transplanted heart   13. Stage 3b chronic kidney disease (Cr 1.5, GFR 37 - 11/22/23) - followed by Dr. Dennise        Plan  Hypertension reasonably controlled blood pressure 104/70 blood pressure goal less than 130/80 Hyperlipidemia recommend statin therapy Crestor  LDL goal less than 70 Angina reasonably stable about his coronary disease continue metoprolol  titrate statin Multiple myeloma follow-up hematology continue conservative management GERD continue OTC Protonix  therapy for reflux type symptoms Chronic renal insufficiency maintain adequate hydration follow-up with nephrology Myocardial infarction possible myocardial infarction) continue conservative therapy DC Imdur no further chest pain continue nitrates as necessary Refer patient to  neurology for numbness in the toes Have the patient follow-up in 6 months       Return in about 6 months (around 07/04/2024).  DWAYNE D CALLWOOD, MD  This dictation was prepared with dragon dictation.  Any transcription errors that result from this process are unintentional.

## 2024-01-22 NOTE — Progress Notes (Unsigned)
   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  Procedure aborted today  Imaging: CT A/P noncon (Sep 2025) - no GU abnormalities  Assessment and Plan: Possible developing UTI at time of schedule cystoscopy.  Recommended we defer cystoscopy today, send urine for reflex culture, empiric treatment with 5-day Macrobid, and reschedule cystoscopy in the next 1-2 weeks.  Patient in agreement with plan  [name], MD 01/22/2024

## 2024-01-25 ENCOUNTER — Other Ambulatory Visit: Payer: Self-pay | Admitting: Urology

## 2024-01-26 ENCOUNTER — Encounter: Payer: Self-pay | Admitting: Urology

## 2024-01-26 ENCOUNTER — Ambulatory Visit (INDEPENDENT_AMBULATORY_CARE_PROVIDER_SITE_OTHER): Admitting: Urology

## 2024-01-26 VITALS — BP 115/67 | HR 90 | Ht 60.0 in | Wt 162.0 lb

## 2024-01-26 DIAGNOSIS — R310 Gross hematuria: Secondary | ICD-10-CM | POA: Diagnosis not present

## 2024-01-26 LAB — URINALYSIS, COMPLETE
Bilirubin, UA: NEGATIVE
Glucose, UA: NEGATIVE
Ketones, UA: NEGATIVE
Nitrite, UA: NEGATIVE
Protein,UA: NEGATIVE
RBC, UA: NEGATIVE
Specific Gravity, UA: 1.025 (ref 1.005–1.030)
Urobilinogen, Ur: 0.2 mg/dL (ref 0.2–1.0)
pH, UA: 6 (ref 5.0–7.5)

## 2024-01-26 LAB — MICROSCOPIC EXAMINATION

## 2024-01-26 MED ORDER — LIDOCAINE HCL URETHRAL/MUCOSAL 2 % EX GEL: 1.0000 | Freq: Once | CUTANEOUS | Status: DC

## 2024-02-01 LAB — CULTURE, URINE COMPREHENSIVE

## 2024-02-01 NOTE — Progress Notes (Deleted)
   02/09/2024 8:40 AM   Julie Khan [birth year] [id number]  Cystoscopy Procedure Note:  Indication:  Remote hx of NMIBC (>20 years ago, s/p prior induction BCG in [place]) Last cystoscopy in Oct 2024 - NED E.Faecalis UTI (01/26/24) - s/p 5d Macrobid  After informed consent and discussion of the procedure and its risks, Julie Khan was positioned and prepped in the standard fashion. Cystoscopy was performed with a flexible cystoscope. The external vaginal anatomy, urethra, bladder neck and bladder mucosa were visualized in a systematic fashion. The ureteral orifices were noted in orthotopic location and orientation. There were no bladder mucosal lesions, stones, debris or anatomic variants noted.   Imaging: Recent {bglistimagingtype:[id number]} reviewed - no GU abnormalities noted  Findings: ***  Assessment and Plan: ***  Julie Skye, MD 02/01/2024

## 2024-02-02 ENCOUNTER — Other Ambulatory Visit: Payer: Self-pay

## 2024-02-02 DIAGNOSIS — N390 Urinary tract infection, site not specified: Secondary | ICD-10-CM

## 2024-02-02 DIAGNOSIS — R3989 Other symptoms and signs involving the genitourinary system: Secondary | ICD-10-CM

## 2024-02-02 MED ORDER — NITROFURANTOIN MONOHYD MACRO 100 MG PO CAPS: 100.0000 mg | ORAL_CAPSULE | Freq: Two times a day (BID) | ORAL | 0 refills | Status: AC

## 2024-02-08 ENCOUNTER — Encounter: Payer: Self-pay | Admitting: Family Medicine

## 2024-02-08 DIAGNOSIS — Z1231 Encounter for screening mammogram for malignant neoplasm of breast: Secondary | ICD-10-CM

## 2024-02-09 ENCOUNTER — Other Ambulatory Visit: Admitting: Urology
